# Patient Record
Sex: Male | Born: 1950
Health system: Southern US, Community
[De-identification: ages and names within clinical notes are randomized; demographics above are authoritative.]

## PROBLEM LIST (undated history)

## (undated) DIAGNOSIS — C801 Malignant (primary) neoplasm, unspecified: Secondary | ICD-10-CM

## (undated) DIAGNOSIS — H4310 Vitreous hemorrhage, unspecified eye: Secondary | ICD-10-CM

## (undated) DIAGNOSIS — M1990 Unspecified osteoarthritis, unspecified site: Secondary | ICD-10-CM

## (undated) DIAGNOSIS — I10 Essential (primary) hypertension: Secondary | ICD-10-CM

## (undated) DIAGNOSIS — E11319 Type 2 diabetes mellitus with unspecified diabetic retinopathy without macular edema: Secondary | ICD-10-CM

## (undated) DIAGNOSIS — E785 Hyperlipidemia, unspecified: Secondary | ICD-10-CM

## (undated) DIAGNOSIS — M5430 Sciatica, unspecified side: Secondary | ICD-10-CM

## (undated) DIAGNOSIS — N189 Chronic kidney disease, unspecified: Secondary | ICD-10-CM

## (undated) DIAGNOSIS — E119 Type 2 diabetes mellitus without complications: Secondary | ICD-10-CM

## (undated) DIAGNOSIS — I2510 Atherosclerotic heart disease of native coronary artery without angina pectoris: Secondary | ICD-10-CM

## (undated) HISTORY — DX: Hyperlipidemia, unspecified: E78.5

## (undated) HISTORY — PX: DIAGNOSTIC LAPAROSCOPY: SUR761

## (undated) HISTORY — PX: EYE SURGERY: SHX253

## (undated) HISTORY — DX: Essential (primary) hypertension: I10

## (undated) HISTORY — PX: KIDNEY SURGERY: SHX687

## (undated) HISTORY — PX: CATARACT EXTRACTION, BILATERAL: SHX1313

## (undated) HISTORY — DX: Unspecified osteoarthritis, unspecified site: M19.90

## (undated) HISTORY — PX: HERNIA REPAIR: SHX51

## (undated) HISTORY — DX: Atherosclerotic heart disease of native coronary artery without angina pectoris: I25.10

## (undated) HISTORY — DX: Type 2 diabetes mellitus without complications: E11.9

## (undated) HISTORY — PX: VASECTOMY: SHX75

---

## 2007-01-02 HISTORY — PX: CORONARY ARTERY BYPASS GRAFT: SHX141

## 2013-04-17 ENCOUNTER — Encounter: Payer: Self-pay | Admitting: Interventional Cardiology

## 2013-04-17 ENCOUNTER — Ambulatory Visit (INDEPENDENT_AMBULATORY_CARE_PROVIDER_SITE_OTHER): Payer: Commercial Indemnity | Admitting: Interventional Cardiology

## 2013-04-17 VITALS — BP 138/74 | HR 92 | Ht 68.0 in | Wt 166.4 lb

## 2013-04-17 DIAGNOSIS — I739 Peripheral vascular disease, unspecified: Secondary | ICD-10-CM

## 2013-04-17 DIAGNOSIS — E782 Mixed hyperlipidemia: Secondary | ICD-10-CM | POA: Insufficient documentation

## 2013-04-17 DIAGNOSIS — R9431 Abnormal electrocardiogram [ECG] [EKG]: Secondary | ICD-10-CM | POA: Insufficient documentation

## 2013-04-17 DIAGNOSIS — I779 Disorder of arteries and arterioles, unspecified: Secondary | ICD-10-CM | POA: Insufficient documentation

## 2013-04-17 DIAGNOSIS — IMO0002 Reserved for concepts with insufficient information to code with codable children: Secondary | ICD-10-CM | POA: Insufficient documentation

## 2013-04-17 DIAGNOSIS — I6529 Occlusion and stenosis of unspecified carotid artery: Secondary | ICD-10-CM

## 2013-04-17 DIAGNOSIS — I2510 Atherosclerotic heart disease of native coronary artery without angina pectoris: Secondary | ICD-10-CM | POA: Insufficient documentation

## 2013-04-17 NOTE — Patient Instructions (Addendum)
Your physician wants you to follow-up in: 6 months with Dr, Irish Lack. You will receive a reminder letter in the mail two months in advance. If you don't receive a letter, please call our office to schedule the follow-up appointment.  Your physician recommends that you continue on your current medications as directed. Please refer to the Current Medication list given to you today.  Have a copy of your cholesterol lab work faxed to [phone].  You will get a call with your carotid doppler appt!

## 2013-04-17 NOTE — Progress Notes (Signed)
Patient ID: Jeremy Johnston, male   DOB: 02/01/1951, 63 y.o.   MRN: [id number]     Patient ID: Jeremy Johnston MRN: [id number] DOB/AGE: 05-Oct-1950 63 y.o.   Referring Physician    Reason for Consultation: Establish care  HPI: 62 y/o who has had DM and CAD.  DM diagnosed in 1986.  He moved to [place] recently.  He had CABG in 2008.  He was essentially asypmtomatic at the time of his CABG.  He had a stress test in 2011 with a small inferior defect.  He had a ETT in 2014 which was unremarkable per his report.  He is alternating nuclear tests and ETT on an annual basis.  He is due for a nuclear test in 2015.    He has an occasional sharp pain on the left side of his chest.  He will be seeing Dr. Buddy Duty in Feb 2015.  He uses the treadmill regularly except for the last few months due to moving.  He walks up stairs and up hill to get to work.  No problems with that walking.    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  He works for [employer].    Current Outpatient Prescriptions  Medication Sig Dispense Refill  . aspirin 81 MG tablet Take 81 mg by mouth daily.      . clopidogrel (PLAVIX) 75 MG tablet Take 75 mg by mouth daily with breakfast.      . ezetimibe (ZETIA) 10 MG tablet Take 10 mg by mouth daily.      . Insulin Human (INSULIN PUMP) 100 unit/ml SOLN Inject into the skin.      Marland Kitchen quinapril-hydrochlorothiazide (ACCURETIC) 20-12.5 MG per tablet Take 1 tablet by mouth daily. 1/2 tablet daily      . rosuvastatin (CRESTOR) 40 MG tablet Take 40 mg by mouth daily.       No current facility-administered medications for this visit.   Past Medical History  Diagnosis Date  . Coronary artery disease   . Hyperlipidemia   . Hypertension   . Diabetes mellitus     Family History  Problem Relation Age of Onset  . Heart attack Father     History   Social History  . Marital Status: Married    Spouse Name: N/A    Number of Children: N/A  . Years of Education: N/A   Occupational History  . Not on  file.   Social History Main Topics  . Smoking status: Never Smoker   . Smokeless tobacco: Not on file  . Alcohol Use: Not on file  . Drug Use: Not on file  . Sexual Activity: Not on file   Other Topics Concern  . Not on file   Social History Narrative  . No narrative on file    Past Surgical History  Procedure Laterality Date  . Kidney surgery      tumor removed  . Eye surgery      retina peele  . Coronary artery bypass graft  10/08      (Not in a hospital admission)  Review of systems complete and found to be negative unless listed above .  No nausea, vomiting.  No fever chills, No focal weakness,  No palpitations.  Physical Exam: Filed Vitals:   04/17/13 0939  BP: 138/74  Pulse: 92    Weight: 166 lb 6.4 oz (75.479 kg)  Physical exam:  [place]/AT EOMI No JVD, No carotid bruit RRR S1S2  No wheezing Soft. NT, nondistended  No edema. No focal motor or sensory deficits Normal affect  Labs:   No results found for this basename: WBC, HGB, HCT, MCV, PLT   No results found for this basename: NA, K, CL, CO2, BUN, CREATININE, CALCIUM, LABALBU, PROT, BILITOT, ALKPHOS, ALT, AST, GLUCOSE,  in the last 168 hours No results found for this basename: CKTOTAL, CKMB, CKMBINDEX, TROPONINI    No results found for this basename: CHOL   No results found for this basename: HDL   No results found for this basename: LDLCALC   No results found for this basename: TRIG   No results found for this basename: CHOLHDL   No results found for this basename: LDLDIRECT      Radiology: Carotid duplex as below; ECho in 3/14 showed normal LV function. No RWMA.  Adequate valvuar function. EKG:NSR, inferior Q waves, inferior ST segment changes  ASSESSMENT AND PLAN:   CAD: Records reviewed.  Stress test noted as above.  Plan for stress test in 6 months.   Carotid atherosclerosis: Moderate bilateral carotid aterosclerosis.  Records reviewed. Left ICA 50-69%; right ICA < 50%.  Carotid Duplex  every 6 months.   Hyperlipidemia: Continue Crestor and Zetia.  LDL target < 100. Need carotid Duplex in 3/15.  Abnormal ECG: Inferior Q waves.  Noted on prior ECG.  Normal wall motion.  Signed:   Mina Marble, MD, [facility] 04/17/2013, 9:50 AM

## 2013-05-23 ENCOUNTER — Other Ambulatory Visit: Payer: Self-pay

## 2013-05-23 MED ORDER — QUINAPRIL-HYDROCHLOROTHIAZIDE 20-12.5 MG PO TABS: ORAL_TABLET | ORAL | Status: DC

## 2013-05-23 MED ORDER — CLOPIDOGREL BISULFATE 75 MG PO TABS: 75.0000 mg | ORAL_TABLET | Freq: Every day | ORAL | Status: DC

## 2013-06-16 ENCOUNTER — Ambulatory Visit (HOSPITAL_COMMUNITY): Payer: Commercial Indemnity

## 2013-06-16 ENCOUNTER — Encounter: Payer: Self-pay | Admitting: Internal Medicine

## 2013-07-03 ENCOUNTER — Encounter: Payer: Self-pay | Admitting: *Deleted

## 2013-07-03 ENCOUNTER — Encounter: Payer: Commercial Indemnity | Attending: Internal Medicine | Admitting: *Deleted

## 2013-07-03 VITALS — Ht 67.0 in | Wt 162.1 lb

## 2013-07-03 DIAGNOSIS — E119 Type 2 diabetes mellitus without complications: Secondary | ICD-10-CM | POA: Insufficient documentation

## 2013-07-03 DIAGNOSIS — Z713 Dietary counseling and surveillance: Secondary | ICD-10-CM | POA: Insufficient documentation

## 2013-07-03 DIAGNOSIS — Z794 Long term (current) use of insulin: Secondary | ICD-10-CM | POA: Insufficient documentation

## 2013-07-03 DIAGNOSIS — E1065 Type 1 diabetes mellitus with hyperglycemia: Secondary | ICD-10-CM

## 2013-07-03 DIAGNOSIS — IMO0002 Reserved for concepts with insufficient information to code with codable children: Secondary | ICD-10-CM

## 2013-07-03 NOTE — Patient Instructions (Signed)
You have completed the AOB and Health Questionnaires for the [facility] Vibe Pump and CGM Animas will contact you with updates on the progress of purchasing this

## 2013-07-03 NOTE — Progress Notes (Signed)
Introduction to Insulin Pump Therapy:  Appt start time: 1500 end time:  1600.  Assessment:  This patient has DM 1 and their primary concerns today: upgrading to newer Animas pump: Vibe.  This patient is interested in learning more about this insulin pump therapy because his is out of warranty  MEDICATIONS: See list. Patient uses Novolog in his Kerin Perna pump  Patient does currently have Ketone Strips  This patient is currently adjusting bolus insulin based BG at a correction ratio of 1/30 This patient is currently adjusting bolus insulin based on carb intake at ratio of 1/10 grams  Patient states knowledge of Carb Counting is excellent  Usual physical activity: treadmill 30 minutes 3 days a week at home  Last A1c was 7.9% on this date 05/15/13  Patient currently is working as Tax adviser for [employer] Obtaining an Insulin PumpGoal(s):  In progress.  Patient states their expectations of pump therapy include: continued management of his diabetes Patient expresses understanding that for improved outcomes for their diabetes on an insulin pump they will:  Check BG 4+ times per day  Change out pump infusion set at least every 3 days  Upload pump information to software on a regular basis so provider can assess patterns and make setting adjustments.  The Animas Vibe Pump is integrated with the Dexcom CGM     Intervention:   We reviewed the [employer]. I described the value of the CGM data and how the alerts can be helpful to prevent hyper and hypo-glycemia.   Handouts given during visit include:  Insulin Pump Packet from [employer]  Monitoring/Evaluation:    Patient does want to continue with pursuit of insulin pump and CGM  Follow up will be for training on the Vibe and Dexcom G4 CGM

## 2013-08-01 ENCOUNTER — Ambulatory Visit (HOSPITAL_COMMUNITY): Payer: Commercial Indemnity | Attending: Internal Medicine | Admitting: Cardiology

## 2013-08-01 DIAGNOSIS — I2510 Atherosclerotic heart disease of native coronary artery without angina pectoris: Secondary | ICD-10-CM | POA: Insufficient documentation

## 2013-08-01 DIAGNOSIS — E119 Type 2 diabetes mellitus without complications: Secondary | ICD-10-CM | POA: Insufficient documentation

## 2013-08-01 DIAGNOSIS — I658 Occlusion and stenosis of other precerebral arteries: Secondary | ICD-10-CM | POA: Insufficient documentation

## 2013-08-01 DIAGNOSIS — Z951 Presence of aortocoronary bypass graft: Secondary | ICD-10-CM | POA: Insufficient documentation

## 2013-08-01 DIAGNOSIS — E785 Hyperlipidemia, unspecified: Secondary | ICD-10-CM | POA: Insufficient documentation

## 2013-08-01 DIAGNOSIS — I6529 Occlusion and stenosis of unspecified carotid artery: Secondary | ICD-10-CM | POA: Insufficient documentation

## 2013-08-01 DIAGNOSIS — I10 Essential (primary) hypertension: Secondary | ICD-10-CM | POA: Insufficient documentation

## 2013-08-01 NOTE — Progress Notes (Signed)
Carotid duplex completed 

## 2013-08-05 ENCOUNTER — Telehealth: Payer: Self-pay | Admitting: Interventional Cardiology

## 2013-08-05 NOTE — Telephone Encounter (Signed)
Returned pts call with results.  

## 2013-08-05 NOTE — Telephone Encounter (Signed)
Follow Up ° °Pt returning call from earlier. Please call. °

## 2013-08-13 ENCOUNTER — Encounter (INDEPENDENT_AMBULATORY_CARE_PROVIDER_SITE_OTHER): Payer: Self-pay | Admitting: Ophthalmology

## 2013-08-19 ENCOUNTER — Telehealth: Payer: Self-pay | Admitting: Interventional Cardiology

## 2013-08-19 ENCOUNTER — Other Ambulatory Visit: Payer: Self-pay | Admitting: Cardiology

## 2013-08-19 DIAGNOSIS — I2510 Atherosclerotic heart disease of native coronary artery without angina pectoris: Secondary | ICD-10-CM

## 2013-08-19 NOTE — Telephone Encounter (Signed)
Follow up       Pt will call back around 1 or if you can call him back around 1 he can answer the phone.

## 2013-08-19 NOTE — Telephone Encounter (Signed)
Follow up  ° ° ° °Returning call back to nurse  °

## 2013-08-19 NOTE — Telephone Encounter (Signed)
Pt aware of stress myoview instructions.

## 2013-08-19 NOTE — Telephone Encounter (Signed)
lmtrc

## 2013-08-27 ENCOUNTER — Other Ambulatory Visit (INDEPENDENT_AMBULATORY_CARE_PROVIDER_SITE_OTHER): Payer: Managed Care, Other (non HMO) | Admitting: Ophthalmology

## 2013-08-27 ENCOUNTER — Encounter (INDEPENDENT_AMBULATORY_CARE_PROVIDER_SITE_OTHER): Payer: Managed Care, Other (non HMO) | Admitting: Ophthalmology

## 2013-08-27 DIAGNOSIS — E1165 Type 2 diabetes mellitus with hyperglycemia: Secondary | ICD-10-CM

## 2013-08-27 DIAGNOSIS — I10 Essential (primary) hypertension: Secondary | ICD-10-CM

## 2013-08-27 DIAGNOSIS — E1139 Type 2 diabetes mellitus with other diabetic ophthalmic complication: Secondary | ICD-10-CM

## 2013-08-27 DIAGNOSIS — H43819 Vitreous degeneration, unspecified eye: Secondary | ICD-10-CM

## 2013-08-27 DIAGNOSIS — E11359 Type 2 diabetes mellitus with proliferative diabetic retinopathy without macular edema: Secondary | ICD-10-CM

## 2013-08-27 DIAGNOSIS — H35039 Hypertensive retinopathy, unspecified eye: Secondary | ICD-10-CM

## 2013-08-27 DIAGNOSIS — H2510 Age-related nuclear cataract, unspecified eye: Secondary | ICD-10-CM

## 2013-08-27 DIAGNOSIS — H4310 Vitreous hemorrhage, unspecified eye: Secondary | ICD-10-CM

## 2013-09-01 ENCOUNTER — Ambulatory Visit (HOSPITAL_COMMUNITY): Payer: Commercial Indemnity | Attending: Cardiovascular Disease | Admitting: Radiology

## 2013-09-01 VITALS — BP 135/68 | HR 68 | Ht 67.0 in | Wt 156.0 lb

## 2013-09-01 DIAGNOSIS — I10 Essential (primary) hypertension: Secondary | ICD-10-CM | POA: Insufficient documentation

## 2013-09-01 DIAGNOSIS — E119 Type 2 diabetes mellitus without complications: Secondary | ICD-10-CM | POA: Insufficient documentation

## 2013-09-01 DIAGNOSIS — R079 Chest pain, unspecified: Secondary | ICD-10-CM | POA: Insufficient documentation

## 2013-09-01 DIAGNOSIS — Z9861 Coronary angioplasty status: Secondary | ICD-10-CM | POA: Insufficient documentation

## 2013-09-01 DIAGNOSIS — Z951 Presence of aortocoronary bypass graft: Secondary | ICD-10-CM | POA: Insufficient documentation

## 2013-09-01 DIAGNOSIS — I2510 Atherosclerotic heart disease of native coronary artery without angina pectoris: Secondary | ICD-10-CM | POA: Insufficient documentation

## 2013-09-01 MED ORDER — TECHNETIUM TC 99M SESTAMIBI GENERIC - CARDIOLITE
33.0000 | Freq: Once | INTRAVENOUS | Status: AC | PRN
  Administered 2013-09-01: 33 via INTRAVENOUS

## 2013-09-01 MED ORDER — TECHNETIUM TC 99M SESTAMIBI GENERIC - CARDIOLITE
11.0000 | Freq: Once | INTRAVENOUS | Status: AC | PRN
  Administered 2013-09-01: 11 via INTRAVENOUS

## 2013-09-01 NOTE — Progress Notes (Signed)
[facility] [address] [place],  [address] [phone]    Cardiology Nuclear Med Study  [address] Jeremy Johnston is a 63 y.o. male     MRN : [id number]     DOB: [birth year]  Procedure Date: 09/01/2013  Nuclear Med Background Indication for Stress Test:  Evaluation for Ischemia, Graft Patency and FAA History:  CAD, Cath, CABG, Echo 2014 EF 59%, MPI 2011 (small inf. defect) Cardiac Risk Factors: Carotid Disease, Hypertension, IDDM,and Lipids  Symptoms:  Chest Pain   Nuclear Pre-Procedure Caffeine/Decaff Intake:  None> 12 hrs NPO After: 0615 today   Lungs:  clear O2 Sat: 95% on room air. IV 0.9% NS with Angio Cath:  22g  IV Site: L Antecubital x 1, tolerated well IV Started by:  Irven Baltimore, RN  Chest Size (in):  38 Cup Size: n/a  Height: 5\' 7"  (1.702 m)  Weight:  156 lb (70.761 kg)  BMI:  Body mass index is 24.43 kg/(m^2). Tech Comments:  Fasting CBG was 79 at 0600 today with juice taken per patient. No medications or Insulin Bolus this am. Repeat CBG was 170 at 0717 this am per patient. Irven Baltimore, RN.    Nuclear Med Study 1 or 2 day study: 1 day  Stress Test Type:  Stress  Reading MD: N/A  Order Authorizing Provider:  Larae Grooms, MD  Resting Radionuclide: Technetium 40m Sestamibi  Resting Radionuclide Dose: 11.0 mCi   Stress Radionuclide:  Technetium 16m Sestamibi  Stress Radionuclide Dose: 33.0 mCi           Stress Protocol Rest HR: 68 Stress HR: 153  Rest BP: 135/68 Stress BP: 151/66  Exercise Time (min): 9:30 METS: 10.9           Dose of Adenosine (mg):  n/a Dose of Lexiscan: n/a mg  Dose of Atropine (mg): n/a Dose of Dobutamine: n/a mcg/kg/min (at max HR)  Stress Test Technologist: Glade Lloyd, BS-ES  Nuclear Technologist:  Charlton Amor, CNMT     Rest Procedure:  Myocardial perfusion imaging was performed at rest 45 minutes following the intravenous administration of Technetium 3m Sestamibi. Rest ECG: NSR with  non-specific ST-T wave changes  Stress Procedure:  The patient exercised on the treadmill utilizing the Bruce Protocol for 9:30 minutes. The patient stopped due to fatigue and denied any chest pain.  Technetium 66m Sestamibi was injected at peak exercise and myocardial perfusion imaging was performed after a brief delay. Stress ECG: Downsloping ST depression V4-V6 with stress.  QPS Raw Data Images:  Normal; no motion artifact; normal heart/lung ratio. Stress Images:  There is decreased uptake in the inferior wall. Rest Images:  There is decreased uptake in the inferior wall. Subtraction (SDS):  There is a fixed defect that is most consistent with a previous infarction. Transient Ischemic Dilatation (Normal <1.22):  1.08 Lung/Heart Ratio (Normal <0.45):  0.34  Quantitative Gated Spect Images QGS EDV:  121 ml QGS ESV:  59 ml  Impression Exercise Capacity:  Good exercise capacity. BP Response:  Hypotensive blood pressure response. Clinical Symptoms:  No chest pain. ECG Impression:  1 mm downsloping ST depression V4-V6 Comparison with Prior Nuclear Study: No images to compare  Overall Impression:  Intermediate risk stress nuclear study. No symptoms were reported.  There is a small inferolateral defect of moderate severity and with partial reversibility, consistent with old scar with mild peri-infarct ischemia. There was a hypotensive response to exercise in stage 3 (94/68?) and early recovery stage.  Overall LV function is normal with mild inferior wall hypokinesis.  LV Ejection Fraction: 52%.  LV Wall Motion:  Mild inferior wall hypokinesis.   Darlin Coco MD

## 2013-12-10 ENCOUNTER — Ambulatory Visit (INDEPENDENT_AMBULATORY_CARE_PROVIDER_SITE_OTHER): Payer: Managed Care, Other (non HMO) | Admitting: Ophthalmology

## 2013-12-10 DIAGNOSIS — H43819 Vitreous degeneration, unspecified eye: Secondary | ICD-10-CM

## 2013-12-10 DIAGNOSIS — H2510 Age-related nuclear cataract, unspecified eye: Secondary | ICD-10-CM

## 2013-12-10 DIAGNOSIS — H35039 Hypertensive retinopathy, unspecified eye: Secondary | ICD-10-CM

## 2013-12-10 DIAGNOSIS — I10 Essential (primary) hypertension: Secondary | ICD-10-CM

## 2013-12-10 DIAGNOSIS — E11359 Type 2 diabetes mellitus with proliferative diabetic retinopathy without macular edema: Secondary | ICD-10-CM

## 2013-12-10 DIAGNOSIS — E1165 Type 2 diabetes mellitus with hyperglycemia: Secondary | ICD-10-CM

## 2013-12-10 DIAGNOSIS — E11311 Type 2 diabetes mellitus with unspecified diabetic retinopathy with macular edema: Secondary | ICD-10-CM

## 2013-12-10 DIAGNOSIS — E1139 Type 2 diabetes mellitus with other diabetic ophthalmic complication: Secondary | ICD-10-CM

## 2013-12-29 ENCOUNTER — Ambulatory Visit (INDEPENDENT_AMBULATORY_CARE_PROVIDER_SITE_OTHER): Payer: Managed Care, Other (non HMO) | Admitting: Ophthalmology

## 2014-01-02 ENCOUNTER — Encounter (INDEPENDENT_AMBULATORY_CARE_PROVIDER_SITE_OTHER): Payer: Managed Care, Other (non HMO) | Admitting: Ophthalmology

## 2014-01-02 DIAGNOSIS — I10 Essential (primary) hypertension: Secondary | ICD-10-CM

## 2014-01-02 DIAGNOSIS — E11311 Type 2 diabetes mellitus with unspecified diabetic retinopathy with macular edema: Secondary | ICD-10-CM

## 2014-01-02 DIAGNOSIS — H35033 Hypertensive retinopathy, bilateral: Secondary | ICD-10-CM

## 2014-01-02 DIAGNOSIS — E11351 Type 2 diabetes mellitus with proliferative diabetic retinopathy with macular edema: Secondary | ICD-10-CM

## 2014-01-02 DIAGNOSIS — H43813 Vitreous degeneration, bilateral: Secondary | ICD-10-CM

## 2014-01-30 ENCOUNTER — Encounter (INDEPENDENT_AMBULATORY_CARE_PROVIDER_SITE_OTHER): Payer: Managed Care, Other (non HMO) | Admitting: Ophthalmology

## 2014-01-30 DIAGNOSIS — H43813 Vitreous degeneration, bilateral: Secondary | ICD-10-CM

## 2014-01-30 DIAGNOSIS — H35033 Hypertensive retinopathy, bilateral: Secondary | ICD-10-CM

## 2014-01-30 DIAGNOSIS — E11311 Type 2 diabetes mellitus with unspecified diabetic retinopathy with macular edema: Secondary | ICD-10-CM

## 2014-01-30 DIAGNOSIS — I10 Essential (primary) hypertension: Secondary | ICD-10-CM

## 2014-01-30 DIAGNOSIS — E11351 Type 2 diabetes mellitus with proliferative diabetic retinopathy with macular edema: Secondary | ICD-10-CM

## 2014-02-25 ENCOUNTER — Encounter (INDEPENDENT_AMBULATORY_CARE_PROVIDER_SITE_OTHER): Payer: Managed Care, Other (non HMO) | Admitting: Ophthalmology

## 2014-02-25 DIAGNOSIS — E11311 Type 2 diabetes mellitus with unspecified diabetic retinopathy with macular edema: Secondary | ICD-10-CM

## 2014-02-25 DIAGNOSIS — H35033 Hypertensive retinopathy, bilateral: Secondary | ICD-10-CM

## 2014-02-25 DIAGNOSIS — E11351 Type 2 diabetes mellitus with proliferative diabetic retinopathy with macular edema: Secondary | ICD-10-CM

## 2014-02-25 DIAGNOSIS — H43813 Vitreous degeneration, bilateral: Secondary | ICD-10-CM

## 2014-02-25 DIAGNOSIS — I10 Essential (primary) hypertension: Secondary | ICD-10-CM

## 2014-03-02 ENCOUNTER — Encounter (INDEPENDENT_AMBULATORY_CARE_PROVIDER_SITE_OTHER): Payer: Managed Care, Other (non HMO) | Admitting: Ophthalmology

## 2014-03-02 DIAGNOSIS — H35033 Hypertensive retinopathy, bilateral: Secondary | ICD-10-CM

## 2014-03-02 DIAGNOSIS — H43813 Vitreous degeneration, bilateral: Secondary | ICD-10-CM

## 2014-03-02 DIAGNOSIS — E11311 Type 2 diabetes mellitus with unspecified diabetic retinopathy with macular edema: Secondary | ICD-10-CM

## 2014-03-02 DIAGNOSIS — E11351 Type 2 diabetes mellitus with proliferative diabetic retinopathy with macular edema: Secondary | ICD-10-CM

## 2014-03-02 DIAGNOSIS — H4312 Vitreous hemorrhage, left eye: Secondary | ICD-10-CM

## 2014-03-02 DIAGNOSIS — I10 Essential (primary) hypertension: Secondary | ICD-10-CM

## 2014-03-02 DIAGNOSIS — E11359 Type 2 diabetes mellitus with proliferative diabetic retinopathy without macular edema: Secondary | ICD-10-CM

## 2014-03-04 ENCOUNTER — Other Ambulatory Visit: Payer: Self-pay | Admitting: Gastroenterology

## 2014-03-23 ENCOUNTER — Encounter (HOSPITAL_COMMUNITY): Payer: Self-pay | Admitting: *Deleted

## 2014-04-01 ENCOUNTER — Encounter (INDEPENDENT_AMBULATORY_CARE_PROVIDER_SITE_OTHER): Payer: Managed Care, Other (non HMO) | Admitting: Ophthalmology

## 2014-04-01 DIAGNOSIS — H43813 Vitreous degeneration, bilateral: Secondary | ICD-10-CM

## 2014-04-01 DIAGNOSIS — E11311 Type 2 diabetes mellitus with unspecified diabetic retinopathy with macular edema: Secondary | ICD-10-CM

## 2014-04-01 DIAGNOSIS — H35033 Hypertensive retinopathy, bilateral: Secondary | ICD-10-CM

## 2014-04-01 DIAGNOSIS — E11351 Type 2 diabetes mellitus with proliferative diabetic retinopathy with macular edema: Secondary | ICD-10-CM

## 2014-04-01 DIAGNOSIS — I10 Essential (primary) hypertension: Secondary | ICD-10-CM

## 2014-04-13 ENCOUNTER — Ambulatory Visit (HOSPITAL_COMMUNITY): Admission: RE | Admit: 2014-04-13 | Payer: Commercial Indemnity | Source: Ambulatory Visit | Admitting: Gastroenterology

## 2014-04-13 HISTORY — DX: Chronic kidney disease, unspecified: N18.9

## 2014-04-13 HISTORY — DX: Malignant (primary) neoplasm, unspecified: C80.1

## 2014-04-13 SURGERY — COLONOSCOPY WITH PROPOFOL
Anesthesia: Monitor Anesthesia Care

## 2014-04-29 ENCOUNTER — Encounter (INDEPENDENT_AMBULATORY_CARE_PROVIDER_SITE_OTHER): Payer: Managed Care, Other (non HMO) | Admitting: Ophthalmology

## 2014-04-30 ENCOUNTER — Encounter (INDEPENDENT_AMBULATORY_CARE_PROVIDER_SITE_OTHER): Payer: Managed Care, Other (non HMO) | Admitting: Ophthalmology

## 2014-05-01 ENCOUNTER — Encounter (INDEPENDENT_AMBULATORY_CARE_PROVIDER_SITE_OTHER): Payer: Managed Care, Other (non HMO) | Admitting: Ophthalmology

## 2014-05-04 ENCOUNTER — Encounter (INDEPENDENT_AMBULATORY_CARE_PROVIDER_SITE_OTHER): Payer: Managed Care, Other (non HMO) | Admitting: Ophthalmology

## 2014-05-04 DIAGNOSIS — E11351 Type 2 diabetes mellitus with proliferative diabetic retinopathy with macular edema: Secondary | ICD-10-CM

## 2014-05-04 DIAGNOSIS — H43813 Vitreous degeneration, bilateral: Secondary | ICD-10-CM

## 2014-05-04 DIAGNOSIS — H35033 Hypertensive retinopathy, bilateral: Secondary | ICD-10-CM

## 2014-05-04 DIAGNOSIS — E11311 Type 2 diabetes mellitus with unspecified diabetic retinopathy with macular edema: Secondary | ICD-10-CM

## 2014-05-04 DIAGNOSIS — I10 Essential (primary) hypertension: Secondary | ICD-10-CM

## 2014-05-28 ENCOUNTER — Encounter (INDEPENDENT_AMBULATORY_CARE_PROVIDER_SITE_OTHER): Payer: Managed Care, Other (non HMO) | Admitting: Ophthalmology

## 2014-05-28 DIAGNOSIS — H35033 Hypertensive retinopathy, bilateral: Secondary | ICD-10-CM

## 2014-05-28 DIAGNOSIS — E11351 Type 2 diabetes mellitus with proliferative diabetic retinopathy with macular edema: Secondary | ICD-10-CM

## 2014-05-28 DIAGNOSIS — H43813 Vitreous degeneration, bilateral: Secondary | ICD-10-CM

## 2014-05-28 DIAGNOSIS — E11311 Type 2 diabetes mellitus with unspecified diabetic retinopathy with macular edema: Secondary | ICD-10-CM

## 2014-05-28 DIAGNOSIS — I10 Essential (primary) hypertension: Secondary | ICD-10-CM

## 2014-05-29 ENCOUNTER — Other Ambulatory Visit: Payer: Self-pay

## 2014-05-29 MED ORDER — QUINAPRIL-HYDROCHLOROTHIAZIDE 20-12.5 MG PO TABS
ORAL_TABLET | ORAL | Status: DC
Start: 2014-05-29 — End: 2014-10-15

## 2014-05-29 MED ORDER — CLOPIDOGREL BISULFATE 75 MG PO TABS: 75.0000 mg | ORAL_TABLET | Freq: Every day | ORAL | Status: DC

## 2014-06-25 ENCOUNTER — Encounter (INDEPENDENT_AMBULATORY_CARE_PROVIDER_SITE_OTHER): Payer: Managed Care, Other (non HMO) | Admitting: Ophthalmology

## 2014-06-25 DIAGNOSIS — I10 Essential (primary) hypertension: Secondary | ICD-10-CM | POA: Diagnosis not present

## 2014-06-25 DIAGNOSIS — E11351 Type 2 diabetes mellitus with proliferative diabetic retinopathy with macular edema: Secondary | ICD-10-CM | POA: Diagnosis not present

## 2014-06-25 DIAGNOSIS — H35033 Hypertensive retinopathy, bilateral: Secondary | ICD-10-CM

## 2014-06-25 DIAGNOSIS — H43813 Vitreous degeneration, bilateral: Secondary | ICD-10-CM | POA: Diagnosis not present

## 2014-06-25 DIAGNOSIS — E11311 Type 2 diabetes mellitus with unspecified diabetic retinopathy with macular edema: Secondary | ICD-10-CM | POA: Diagnosis not present

## 2014-07-31 ENCOUNTER — Encounter (INDEPENDENT_AMBULATORY_CARE_PROVIDER_SITE_OTHER): Payer: Managed Care, Other (non HMO) | Admitting: Ophthalmology

## 2014-08-03 ENCOUNTER — Encounter (INDEPENDENT_AMBULATORY_CARE_PROVIDER_SITE_OTHER): Payer: Managed Care, Other (non HMO) | Admitting: Ophthalmology

## 2014-08-03 DIAGNOSIS — H43813 Vitreous degeneration, bilateral: Secondary | ICD-10-CM

## 2014-08-03 DIAGNOSIS — I10 Essential (primary) hypertension: Secondary | ICD-10-CM

## 2014-08-03 DIAGNOSIS — E11351 Type 2 diabetes mellitus with proliferative diabetic retinopathy with macular edema: Secondary | ICD-10-CM

## 2014-08-03 DIAGNOSIS — E11311 Type 2 diabetes mellitus with unspecified diabetic retinopathy with macular edema: Secondary | ICD-10-CM | POA: Diagnosis not present

## 2014-08-03 DIAGNOSIS — H35033 Hypertensive retinopathy, bilateral: Secondary | ICD-10-CM

## 2014-09-11 ENCOUNTER — Encounter (INDEPENDENT_AMBULATORY_CARE_PROVIDER_SITE_OTHER): Payer: Managed Care, Other (non HMO) | Admitting: Ophthalmology

## 2014-09-11 DIAGNOSIS — E11311 Type 2 diabetes mellitus with unspecified diabetic retinopathy with macular edema: Secondary | ICD-10-CM | POA: Diagnosis not present

## 2014-09-11 DIAGNOSIS — H43813 Vitreous degeneration, bilateral: Secondary | ICD-10-CM

## 2014-09-11 DIAGNOSIS — I10 Essential (primary) hypertension: Secondary | ICD-10-CM

## 2014-09-11 DIAGNOSIS — E11351 Type 2 diabetes mellitus with proliferative diabetic retinopathy with macular edema: Secondary | ICD-10-CM

## 2014-09-11 DIAGNOSIS — E11359 Type 2 diabetes mellitus with proliferative diabetic retinopathy without macular edema: Secondary | ICD-10-CM

## 2014-09-11 DIAGNOSIS — H35033 Hypertensive retinopathy, bilateral: Secondary | ICD-10-CM | POA: Diagnosis not present

## 2014-09-14 ENCOUNTER — Encounter (INDEPENDENT_AMBULATORY_CARE_PROVIDER_SITE_OTHER): Payer: Managed Care, Other (non HMO) | Admitting: Ophthalmology

## 2014-09-17 ENCOUNTER — Telehealth: Payer: Self-pay | Admitting: Interventional Cardiology

## 2014-09-17 DIAGNOSIS — I2510 Atherosclerotic heart disease of native coronary artery without angina pectoris: Secondary | ICD-10-CM

## 2014-09-17 NOTE — Telephone Encounter (Signed)
Pt calling stating that he normally has a stress test each year. Pt states that he typically alternates between a nuclear every other year and then a ETT in between. Informed pt that I would route this information to Dr. Irish Lack for review and advisement on ordering ETT. Scheduled pt for OV as well on 7/14.

## 2014-09-17 NOTE — Telephone Encounter (Signed)
New Message       Pt calling stating that he would like to have an exercise tolerance test done, there are no orders in Epic. Please call back and advise.

## 2014-09-17 NOTE — Telephone Encounter (Signed)
Informed pt that Dr. Irish Lack said it was ok to order ETT. Gave verbal instructions. Pt verbalized understanding.

## 2014-09-17 NOTE — Telephone Encounter (Signed)
Left message to call back  

## 2014-09-17 NOTE — Telephone Encounter (Signed)
OK to order ETT for h/o CAD.

## 2014-09-22 ENCOUNTER — Telehealth: Payer: Self-pay | Admitting: Interventional Cardiology

## 2014-09-22 NOTE — Telephone Encounter (Signed)
Spoke with the [facility] team and they said they had called yesterday to schedule ETT. Spoke with pt and informed him that it was our scheduling team that had called him. Informed pt that I let them know that he called back and they would be contacting him to schedule. Pt verbalized understanding and was in agreement with this plan.

## 2014-09-22 NOTE — Telephone Encounter (Signed)
New message      Returning a call to Dr Hassell Done nurse

## 2014-09-24 ENCOUNTER — Telehealth (HOSPITAL_COMMUNITY): Payer: Self-pay

## 2014-09-24 NOTE — Telephone Encounter (Signed)
Encounter complete. 

## 2014-09-29 ENCOUNTER — Ambulatory Visit (HOSPITAL_COMMUNITY)
Admission: RE | Admit: 2014-09-29 | Discharge: 2014-09-29 | Disposition: A | Discharge status: Home / Self Care | Payer: Commercial Indemnity | Source: Ambulatory Visit | Attending: Interventional Cardiology | Admitting: Interventional Cardiology

## 2014-09-29 DIAGNOSIS — R0609 Other forms of dyspnea: Secondary | ICD-10-CM | POA: Insufficient documentation

## 2014-09-29 DIAGNOSIS — I493 Ventricular premature depolarization: Secondary | ICD-10-CM | POA: Diagnosis not present

## 2014-09-29 DIAGNOSIS — I2510 Atherosclerotic heart disease of native coronary artery without angina pectoris: Secondary | ICD-10-CM | POA: Diagnosis not present

## 2014-09-29 LAB — EXERCISE TOLERANCE TEST
CHL CUP MPHR: 157 {beats}/min
CHL CUP RESTING HR STRESS: 91 {beats}/min
CSEPED: 9 min
CSEPHR: 104 %
Estimated workload: 10.3 METS
Peak HR: 164 {beats}/min
RPE: 15

## 2014-10-15 ENCOUNTER — Ambulatory Visit (INDEPENDENT_AMBULATORY_CARE_PROVIDER_SITE_OTHER): Payer: Commercial Indemnity | Admitting: Interventional Cardiology

## 2014-10-15 ENCOUNTER — Encounter: Payer: Self-pay | Admitting: Interventional Cardiology

## 2014-10-15 VITALS — BP 110/60 | HR 69 | Ht 67.0 in | Wt 169.4 lb

## 2014-10-15 DIAGNOSIS — E782 Mixed hyperlipidemia: Secondary | ICD-10-CM

## 2014-10-15 DIAGNOSIS — I10 Essential (primary) hypertension: Secondary | ICD-10-CM

## 2014-10-15 DIAGNOSIS — I2510 Atherosclerotic heart disease of native coronary artery without angina pectoris: Secondary | ICD-10-CM

## 2014-10-15 DIAGNOSIS — I739 Peripheral vascular disease, unspecified: Principal | ICD-10-CM

## 2014-10-15 DIAGNOSIS — I779 Disorder of arteries and arterioles, unspecified: Secondary | ICD-10-CM | POA: Diagnosis not present

## 2014-10-15 MED ORDER — QUINAPRIL-HYDROCHLOROTHIAZIDE 20-12.5 MG PO TABS: ORAL_TABLET | ORAL | Status: DC

## 2014-10-15 MED ORDER — CLOPIDOGREL BISULFATE 75 MG PO TABS: 75.0000 mg | ORAL_TABLET | Freq: Every day | ORAL | Status: DC

## 2014-10-15 NOTE — Progress Notes (Signed)
Patient ID: Jeremy Johnston, male   DOB: [birth year], 64 y.o.   MRN: [id number]     Cardiology Office Note   Date:  10/15/2014   ID:  Jeremy Johnston, DOB 01-Apr-1951, MRN [id number]  PCP:  No primary care provider on file.  [name] primary; [name].    Chief Complaint  Patient presents with  . Follow-up    mixed hyperlipidemia     Wt Readings from Last 3 Encounters:  10/15/14 169 lb 6.4 oz (76.839 kg)  09/01/13 156 lb (70.761 kg)  07/03/13 162 lb 1.6 oz (73.528 kg)       History of Present Illness: Jeremy Johnston is a 64 y.o. male  who has had DM and CAD. DM diagnosed in 1986. He moved to [place] recently. He had CABG in 2008. He was essentially asypmtomatic at the time of his CABG. He had a stress test in 2011 with a small inferior defect. He had a ETT in 2014 which was unremarkable per his report. He is alternating nuclear tests and ETT on an annual basis.     He uses the treadmill intermittently. He walks up stairs and up hill to get to work. No problems with that walking.   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 He had an ETT (9 min on treadmill) in 6/16 which was low risk.  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     Past Medical History  Diagnosis Date  . Coronary artery disease   . Hyperlipidemia   . Hypertension   . Diabetes mellitus     Insulin pump -[name], and has a glucose transmitter attached.  . Chronic kidney disease     Robotic Kidney surgery for kidney cancer tumor-kidney remains-  . Cancer     kidney cancer-both kidneys remain    Past Surgical History  Procedure Laterality Date  . Kidney surgery      tumor removed  . Eye surgery      retina peele  . Coronary artery bypass graft  10/08    x4 vessels([place])  . Diagnostic laparoscopy    . Vasectomy    . Cataract extraction, bilateral Bilateral     11'15     Current Outpatient Prescriptions  Medication  Sig Dispense Refill  . aspirin 81 MG tablet Take 81 mg by mouth daily.    . clopidogrel (PLAVIX) 75 MG tablet Take 1 tablet (75 mg total) by mouth daily with breakfast. 90 tablet 3  . Insulin Human (INSULIN PUMP) 100 unit/ml SOLN Inject into the skin.    . Multiple Vitamin (MULTIVITAMIN WITH MINERALS) TABS tablet Take 1 tablet by mouth daily.    . quinapril-hydrochlorothiazide (ACCURETIC) 20-12.5 MG per tablet 1/2 tablet daily 90 tablet 3  . rosuvastatin (CRESTOR) 40 MG tablet Take 40 mg by mouth every evening.      No current facility-administered medications for this visit.    Allergies:   Review of patient's allergies indicates no known allergies.    Social History:  The patient  reports that he has never smoked. He does not have any smokeless tobacco history on file. He reports that he does not drink alcohol or use illicit drugs.   Family History:  The patient's family history includes Heart attack in his father.    ROS:  Please see the history of present illness.   Otherwise, review of systems are positive for  right sided heart burn- resolved after stopping coffee.   All other systems are reviewed and negative.    PHYSICAL EXAM: VS:  BP 110/60 mmHg  Pulse 69  Ht 5\' 7"  (1.702 m)  Wt 169 lb 6.4 oz (76.839 kg)  BMI 26.53 kg/m2 , BMI Body mass index is 26.53 kg/(m^2). GEN: Well nourished, well developed, in no acute distress HEENT: normal Neck: no JVD, carotid bruits, or masses Cardiac: RRR; no murmurs, rubs, or gallops,no edema  Respiratory:  clear to auscultation bilaterally, normal work of breathing GI: soft, nontender, nondistended, + BS MS: no deformity or atrophy Skin: warm and dry, no rash Neuro:  Strength and sensation are intact Psych: euthymic mood, full affect   EKG:   The ekg ordered today demonstrates NSR, NSST   Recent Labs: No results found for requested labs within last 365 days.   Lipid Panel No results found for: CHOL, TRIG, HDL, CHOLHDL, VLDL,  LDLCALC, LDLDIRECT   Other studies Reviewed: Additional studies/ records that were reviewed today with results demonstrating: stress test result as noted.   ASSESSMENT AND PLAN:  1. CAD:  No angina. Recent stress test was low risk. Continue aggressive secondary prevention. No bleeding problems. Refill clopidogrel. 2. Carotid atherosclerosis:  Repeat carotid Doppler.  Moderate disease. Last check in May 2015. 3. Hyperlipidemia:  Continue current lipid-lowering therapy. He has labs checked with Dr. Buddy Duty. Will have there is labs  Sent to [place] after the next check. 4. HTN:  Well controlled. Continue current medicines. Refill quinapril.   Current medicines are reviewed at length with the patient today.  The patient concerns regarding his medicines were addressed.  The following changes have been made:  No change  Labs/ tests ordered today include:   Orders Placed This Encounter  Procedures  . EKG 12-Lead    Recommend 150 minutes/week of aerobic exercise:  Stressed importance of increasing frequency and consistency of exercise. Low fat, low carb, high fiber diet recommended  Disposition:   FU in 1 year   Teresita Madura., MD  10/15/2014 10:16 AM    [facility] [place], [place], [place]  [address] Phone: [phone]; Fax: [phone]

## 2014-10-15 NOTE — Patient Instructions (Addendum)
Medication Instructions:  Same-co change  Labwork: None  Testing/Procedures: Your physician has requested that you have a carotid duplex. This test is an ultrasound of the carotid arteries in your neck. It looks at blood flow through these arteries that supply the brain with blood. Allow one hour for this exam. There are no restrictions or special instructions.   Follow-Up: Your physician wants you to follow-up in: 1 year. You will receive a reminder letter in the mail two months in advance. If you don't receive a letter, please call our office to schedule the follow-up appointment.

## 2014-10-19 ENCOUNTER — Ambulatory Visit (HOSPITAL_COMMUNITY): Payer: Commercial Indemnity | Attending: Cardiology

## 2014-10-19 DIAGNOSIS — I6523 Occlusion and stenosis of bilateral carotid arteries: Secondary | ICD-10-CM

## 2014-10-19 DIAGNOSIS — I739 Peripheral vascular disease, unspecified: Secondary | ICD-10-CM

## 2014-10-19 DIAGNOSIS — I779 Disorder of arteries and arterioles, unspecified: Secondary | ICD-10-CM | POA: Diagnosis present

## 2014-10-23 ENCOUNTER — Telehealth: Payer: Self-pay | Admitting: Interventional Cardiology

## 2014-10-23 ENCOUNTER — Encounter (INDEPENDENT_AMBULATORY_CARE_PROVIDER_SITE_OTHER): Payer: Managed Care, Other (non HMO) | Admitting: Ophthalmology

## 2014-10-23 DIAGNOSIS — H35372 Puckering of macula, left eye: Secondary | ICD-10-CM | POA: Diagnosis not present

## 2014-10-23 DIAGNOSIS — H35033 Hypertensive retinopathy, bilateral: Secondary | ICD-10-CM | POA: Diagnosis not present

## 2014-10-23 DIAGNOSIS — I10 Essential (primary) hypertension: Secondary | ICD-10-CM | POA: Diagnosis not present

## 2014-10-23 DIAGNOSIS — H43813 Vitreous degeneration, bilateral: Secondary | ICD-10-CM

## 2014-10-23 DIAGNOSIS — E11311 Type 2 diabetes mellitus with unspecified diabetic retinopathy with macular edema: Secondary | ICD-10-CM | POA: Diagnosis not present

## 2014-10-23 DIAGNOSIS — E11351 Type 2 diabetes mellitus with proliferative diabetic retinopathy with macular edema: Secondary | ICD-10-CM | POA: Diagnosis not present

## 2014-10-23 NOTE — Telephone Encounter (Signed)
Results called to patient and mentioned that he would have a repeat in 1 year

## 2014-10-23 NOTE — Telephone Encounter (Signed)
New message     Patient returning a call back to nurse from yesterday.

## 2014-12-11 ENCOUNTER — Encounter (INDEPENDENT_AMBULATORY_CARE_PROVIDER_SITE_OTHER): Payer: Managed Care, Other (non HMO) | Admitting: Ophthalmology

## 2014-12-11 DIAGNOSIS — E11359 Type 2 diabetes mellitus with proliferative diabetic retinopathy without macular edema: Secondary | ICD-10-CM | POA: Diagnosis not present

## 2014-12-11 DIAGNOSIS — H43811 Vitreous degeneration, right eye: Secondary | ICD-10-CM

## 2014-12-11 DIAGNOSIS — E11351 Type 2 diabetes mellitus with proliferative diabetic retinopathy with macular edema: Secondary | ICD-10-CM | POA: Diagnosis not present

## 2014-12-11 DIAGNOSIS — H35033 Hypertensive retinopathy, bilateral: Secondary | ICD-10-CM

## 2014-12-11 DIAGNOSIS — I10 Essential (primary) hypertension: Secondary | ICD-10-CM | POA: Diagnosis not present

## 2014-12-11 DIAGNOSIS — E11311 Type 2 diabetes mellitus with unspecified diabetic retinopathy with macular edema: Secondary | ICD-10-CM | POA: Diagnosis not present

## 2015-01-29 ENCOUNTER — Encounter (INDEPENDENT_AMBULATORY_CARE_PROVIDER_SITE_OTHER): Payer: Managed Care, Other (non HMO) | Admitting: Ophthalmology

## 2015-01-29 DIAGNOSIS — H35033 Hypertensive retinopathy, bilateral: Secondary | ICD-10-CM | POA: Diagnosis not present

## 2015-01-29 DIAGNOSIS — H26492 Other secondary cataract, left eye: Secondary | ICD-10-CM

## 2015-01-29 DIAGNOSIS — H43813 Vitreous degeneration, bilateral: Secondary | ICD-10-CM | POA: Diagnosis not present

## 2015-01-29 DIAGNOSIS — E11311 Type 2 diabetes mellitus with unspecified diabetic retinopathy with macular edema: Secondary | ICD-10-CM | POA: Diagnosis not present

## 2015-01-29 DIAGNOSIS — I10 Essential (primary) hypertension: Secondary | ICD-10-CM

## 2015-01-29 DIAGNOSIS — E113513 Type 2 diabetes mellitus with proliferative diabetic retinopathy with macular edema, bilateral: Secondary | ICD-10-CM | POA: Diagnosis not present

## 2015-02-19 ENCOUNTER — Other Ambulatory Visit (INDEPENDENT_AMBULATORY_CARE_PROVIDER_SITE_OTHER): Payer: Managed Care, Other (non HMO) | Admitting: Ophthalmology

## 2015-02-19 DIAGNOSIS — H26492 Other secondary cataract, left eye: Secondary | ICD-10-CM

## 2015-03-12 ENCOUNTER — Encounter (INDEPENDENT_AMBULATORY_CARE_PROVIDER_SITE_OTHER): Payer: Managed Care, Other (non HMO) | Admitting: Ophthalmology

## 2015-03-12 DIAGNOSIS — E113513 Type 2 diabetes mellitus with proliferative diabetic retinopathy with macular edema, bilateral: Secondary | ICD-10-CM

## 2015-03-12 DIAGNOSIS — I10 Essential (primary) hypertension: Secondary | ICD-10-CM | POA: Diagnosis not present

## 2015-03-12 DIAGNOSIS — H35033 Hypertensive retinopathy, bilateral: Secondary | ICD-10-CM | POA: Diagnosis not present

## 2015-03-12 DIAGNOSIS — H43811 Vitreous degeneration, right eye: Secondary | ICD-10-CM | POA: Diagnosis not present

## 2015-03-12 DIAGNOSIS — E11311 Type 2 diabetes mellitus with unspecified diabetic retinopathy with macular edema: Secondary | ICD-10-CM | POA: Diagnosis not present

## 2015-04-30 ENCOUNTER — Encounter (INDEPENDENT_AMBULATORY_CARE_PROVIDER_SITE_OTHER): Payer: Managed Care, Other (non HMO) | Admitting: Ophthalmology

## 2015-04-30 DIAGNOSIS — E113513 Type 2 diabetes mellitus with proliferative diabetic retinopathy with macular edema, bilateral: Secondary | ICD-10-CM | POA: Diagnosis not present

## 2015-04-30 DIAGNOSIS — H35033 Hypertensive retinopathy, bilateral: Secondary | ICD-10-CM | POA: Diagnosis not present

## 2015-04-30 DIAGNOSIS — E11311 Type 2 diabetes mellitus with unspecified diabetic retinopathy with macular edema: Secondary | ICD-10-CM

## 2015-04-30 DIAGNOSIS — I10 Essential (primary) hypertension: Secondary | ICD-10-CM

## 2015-04-30 DIAGNOSIS — H43813 Vitreous degeneration, bilateral: Secondary | ICD-10-CM

## 2015-06-11 ENCOUNTER — Encounter (INDEPENDENT_AMBULATORY_CARE_PROVIDER_SITE_OTHER): Payer: Managed Care, Other (non HMO) | Admitting: Ophthalmology

## 2015-06-11 DIAGNOSIS — E113593 Type 2 diabetes mellitus with proliferative diabetic retinopathy without macular edema, bilateral: Secondary | ICD-10-CM | POA: Diagnosis not present

## 2015-06-11 DIAGNOSIS — H43813 Vitreous degeneration, bilateral: Secondary | ICD-10-CM | POA: Diagnosis not present

## 2015-06-11 DIAGNOSIS — I10 Essential (primary) hypertension: Secondary | ICD-10-CM

## 2015-06-11 DIAGNOSIS — E11319 Type 2 diabetes mellitus with unspecified diabetic retinopathy without macular edema: Secondary | ICD-10-CM | POA: Diagnosis not present

## 2015-06-11 DIAGNOSIS — H35033 Hypertensive retinopathy, bilateral: Secondary | ICD-10-CM

## 2015-07-22 ENCOUNTER — Encounter: Payer: Self-pay | Admitting: *Deleted

## 2015-07-22 ENCOUNTER — Encounter: Payer: Managed Care, Other (non HMO) | Attending: Internal Medicine | Admitting: *Deleted

## 2015-07-22 DIAGNOSIS — E108 Type 1 diabetes mellitus with unspecified complications: Secondary | ICD-10-CM

## 2015-07-22 DIAGNOSIS — E1042 Type 1 diabetes mellitus with diabetic polyneuropathy: Secondary | ICD-10-CM | POA: Insufficient documentation

## 2015-07-22 DIAGNOSIS — Z794 Long term (current) use of insulin: Secondary | ICD-10-CM | POA: Insufficient documentation

## 2015-07-23 ENCOUNTER — Encounter (INDEPENDENT_AMBULATORY_CARE_PROVIDER_SITE_OTHER): Payer: Managed Care, Other (non HMO) | Admitting: Ophthalmology

## 2015-07-23 DIAGNOSIS — E11311 Type 2 diabetes mellitus with unspecified diabetic retinopathy with macular edema: Secondary | ICD-10-CM | POA: Diagnosis not present

## 2015-07-23 DIAGNOSIS — H35033 Hypertensive retinopathy, bilateral: Secondary | ICD-10-CM

## 2015-07-23 DIAGNOSIS — I10 Essential (primary) hypertension: Secondary | ICD-10-CM

## 2015-07-23 DIAGNOSIS — H4312 Vitreous hemorrhage, left eye: Secondary | ICD-10-CM

## 2015-07-23 DIAGNOSIS — E113512 Type 2 diabetes mellitus with proliferative diabetic retinopathy with macular edema, left eye: Secondary | ICD-10-CM

## 2015-07-23 DIAGNOSIS — E113591 Type 2 diabetes mellitus with proliferative diabetic retinopathy without macular edema, right eye: Secondary | ICD-10-CM

## 2015-07-23 DIAGNOSIS — H43811 Vitreous degeneration, right eye: Secondary | ICD-10-CM | POA: Diagnosis not present

## 2015-08-06 NOTE — Progress Notes (Signed)
Diabetes Self-Management Education  Visit Type: First/Initial  Appt. Start Time: 1400 Appt. End Time: 1500  08/06/2015  [name], identified by name and date of birth, is a 65 y.o. male with a diagnosis of Diabetes: Type 1.   ASSESSMENT Patient had Animas Ping insulin pump for past 5-6 years which he chose initially due to it being waterproof and he enjoys fishing. He has been on [employer] now with Dexcom for several years. He probably has about 2 years on his warranty, he's not quite sure, but he had the [facility] for 4 years and has been on pumps for about 6 years total.  He wears his Dexcom 100% of the time. He would like to consider the Medtronic 630G with Enlite sensor so that he would be in line to move to the 670G when it is shipping.    Individualized Plan for Diabetes Self-Management Training:   I educated the patient on the Medtronic 630G with Enlite sensor. He requested I notify the Medtronic Rep via email so he could pursue obtaining this if his insurance will cover it adequately.   Learning Objective:  Patient will have a greater understanding of diabetes self-management. Patient education plan is to attend individual and/or group sessions per assessed needs and concerns.   Plan:   Patient Instructions  Review information on Medtronic 630G  You've agreed for me to contact Rep for Medtronic so she can initiate  Call this office for training when product ships    Expected Outcomes:  Demonstrated interest in learning. Expect positive outcomes  Education material provided: Medtronic 630G brochure  If problems or questions, patient to contact team via:  Phone and Email  Future DSME appointment: PRN (Patient to call for training when pump and CGM ship

## 2015-08-06 NOTE — Patient Instructions (Signed)
Review information on Medtronic 630G and Dexcom Contact Rep for desired choice to initiate when ready Call this office for training when product ships

## 2015-09-03 ENCOUNTER — Encounter (INDEPENDENT_AMBULATORY_CARE_PROVIDER_SITE_OTHER): Payer: Managed Care, Other (non HMO) | Admitting: Ophthalmology

## 2015-09-03 DIAGNOSIS — E11311 Type 2 diabetes mellitus with unspecified diabetic retinopathy with macular edema: Secondary | ICD-10-CM

## 2015-09-03 DIAGNOSIS — H43813 Vitreous degeneration, bilateral: Secondary | ICD-10-CM | POA: Diagnosis not present

## 2015-09-03 DIAGNOSIS — H35033 Hypertensive retinopathy, bilateral: Secondary | ICD-10-CM | POA: Diagnosis not present

## 2015-09-03 DIAGNOSIS — E113513 Type 2 diabetes mellitus with proliferative diabetic retinopathy with macular edema, bilateral: Secondary | ICD-10-CM | POA: Diagnosis not present

## 2015-09-03 DIAGNOSIS — I10 Essential (primary) hypertension: Secondary | ICD-10-CM | POA: Diagnosis not present

## 2015-10-11 ENCOUNTER — Encounter (INDEPENDENT_AMBULATORY_CARE_PROVIDER_SITE_OTHER): Payer: Managed Care, Other (non HMO) | Admitting: Ophthalmology

## 2015-10-11 DIAGNOSIS — H35033 Hypertensive retinopathy, bilateral: Secondary | ICD-10-CM | POA: Diagnosis not present

## 2015-10-11 DIAGNOSIS — E113591 Type 2 diabetes mellitus with proliferative diabetic retinopathy without macular edema, right eye: Secondary | ICD-10-CM | POA: Diagnosis not present

## 2015-10-11 DIAGNOSIS — E113512 Type 2 diabetes mellitus with proliferative diabetic retinopathy with macular edema, left eye: Secondary | ICD-10-CM

## 2015-10-11 DIAGNOSIS — H43813 Vitreous degeneration, bilateral: Secondary | ICD-10-CM | POA: Diagnosis not present

## 2015-10-11 DIAGNOSIS — E11311 Type 2 diabetes mellitus with unspecified diabetic retinopathy with macular edema: Secondary | ICD-10-CM | POA: Diagnosis not present

## 2015-10-11 DIAGNOSIS — I10 Essential (primary) hypertension: Secondary | ICD-10-CM

## 2015-10-15 ENCOUNTER — Encounter (INDEPENDENT_AMBULATORY_CARE_PROVIDER_SITE_OTHER): Payer: Managed Care, Other (non HMO) | Admitting: Ophthalmology

## 2015-10-26 ENCOUNTER — Ambulatory Visit (INDEPENDENT_AMBULATORY_CARE_PROVIDER_SITE_OTHER): Payer: Managed Care, Other (non HMO) | Admitting: Interventional Cardiology

## 2015-10-26 ENCOUNTER — Encounter: Payer: Self-pay | Admitting: Interventional Cardiology

## 2015-10-26 VITALS — BP 118/60 | HR 66 | Ht 67.0 in | Wt 166.2 lb

## 2015-10-26 DIAGNOSIS — I779 Disorder of arteries and arterioles, unspecified: Secondary | ICD-10-CM

## 2015-10-26 DIAGNOSIS — E785 Hyperlipidemia, unspecified: Secondary | ICD-10-CM

## 2015-10-26 DIAGNOSIS — I739 Peripheral vascular disease, unspecified: Secondary | ICD-10-CM

## 2015-10-26 DIAGNOSIS — I10 Essential (primary) hypertension: Secondary | ICD-10-CM | POA: Diagnosis not present

## 2015-10-26 DIAGNOSIS — I2510 Atherosclerotic heart disease of native coronary artery without angina pectoris: Secondary | ICD-10-CM | POA: Diagnosis not present

## 2015-10-26 NOTE — Patient Instructions (Signed)
Medication Instructions:  Same-no changes  Labwork: None  Testing/Procedures: Your physician has requested that you have a carotid duplex. This test is an ultrasound of the carotid arteries in your neck. It looks at blood flow through these arteries that supply the brain with blood. Allow one hour for this exam. There are no restrictions or special instructions.  Your physician has requested that you have an exercise tolerance test. For further information please visit [email or web address]. Please also follow instruction sheet, as given.   Follow-Up: 1 year.     If you need a refill on your cardiac medications before your next appointment, please call your pharmacy.

## 2015-10-26 NOTE — Progress Notes (Signed)
Patient ID: Jeremy Johnston, male   DOB: 1950/09/30, 65 y.o.   MRN: [id number]     Cardiology Office Note   Date:  10/26/2015   ID:  Jeremy Johnston, DOB [birth year], MRN [id number]  PCP:  Jeremy Hauser, MD  Jeremy Johnston primary; Jeremy Johnston.    No chief complaint on file.  CAD  Wt Readings from Last 3 Encounters:  10/26/15 166 lb 3.2 oz (75.4 kg)  10/15/14 169 lb 6.4 oz (76.8 kg)  09/01/13 156 lb (70.8 kg)       History of Present Illness: Jeremy Johnston is a 65 y.o. male  who has had DM and CAD. DM diagnosed in 1986. He moved to [place] recently. He had CABG in 2008. He was essentially asypmtomatic at the time of his CABG. He had a stress test in 2011 with a small inferior defect. He had a ETT in 2014 which was unremarkable per his report. He is alternating nuclear tests and ETT on an annual basis.     He uses the treadmill intermittently.  He has been bowling more of late- 3x/week. He walks up stairs and up hill to get to work. No problems with that walking. Has a long walk from his parking lot to work.  No problems.  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 He had an ETT (9 min on treadmill) in 6/16 which was low risk.  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  He prefers to have a stress test given his lack of sx at the time of diagnosis of his CAD.     Past Medical History:  Diagnosis Date  . Cancer ([place])    kidney cancer-both kidneys remain  . Chronic kidney disease    Robotic Kidney surgery for kidney cancer tumor-kidney remains-  . Coronary artery disease   . Diabetes mellitus (HCC)    Insulin pump -[name], and has a glucose transmitter attached.  . Hyperlipidemia   . Hypertension     Past Surgical History:  Procedure Laterality Date  . CATARACT EXTRACTION, BILATERAL Bilateral    11'15  . CORONARY ARTERY BYPASS GRAFT  10/08   x4 vessels([place])  . DIAGNOSTIC LAPAROSCOPY    . EYE  SURGERY     retina peele  . KIDNEY SURGERY     tumor removed  . VASECTOMY       Current Outpatient Prescriptions  Medication Sig Dispense Refill  . quinapril-hydrochlorothiazide (ACCURETIC) 20-12.5 MG tablet Take 0.5 tablets by mouth daily. TAKE 1/2 TAB BY MOUTH DAILY    . aspirin 81 MG tablet Take 81 mg by mouth daily.    . clopidogrel (PLAVIX) 75 MG tablet Take 1 tablet (75 mg total) by mouth daily with breakfast. 90 tablet 3  . Insulin Human (INSULIN PUMP) 100 unit/ml SOLN Inject into the skin.    . Multiple Vitamin (MULTIVITAMIN WITH MINERALS) TABS tablet Take 1 tablet by mouth daily.    . rosuvastatin (CRESTOR) 40 MG tablet Take 40 mg by mouth every evening.      No current facility-administered medications for this visit.     Allergies:   Review of patient's allergies indicates no known allergies.    Social History:  The patient  reports that he has never smoked. He does not have any smokeless tobacco history on file. He reports that he does not drink alcohol or use drugs.   Family History:  The patient's family history includes Diabetes in his sister; Heart attack in his father and sister.    ROS:  Please see the history of present illness.   Otherwise, review of systems are positive for right sided heart burn- resolved after stopping coffee.   All other systems are reviewed and negative.    PHYSICAL EXAM: VS:  BP 118/60   Pulse 66   Ht 5\' 7"  (1.702 m)   Wt 166 lb 3.2 oz (75.4 kg)   BMI 26.03 kg/m  , BMI Body mass index is 26.03 kg/m. GEN: Well nourished, well developed, in no acute distress  HEENT: normal  Neck: no JVD, carotid bruits, or masses Cardiac: RRR; no murmurs, rubs, or gallops,no edema  Respiratory:  clear to auscultation bilaterally, normal work of breathing GI: soft, nontender, nondistended, + BS MS: no deformity or atrophy  Skin: warm and dry, no rash Neuro:  Strength and sensation are intact Psych: euthymic mood, full affect   EKG:   The ekg  ordered today demonstrates NSR, NSST   Recent Labs: No results found for requested labs within last 8760 hours.   Lipid Panel No results found for: CHOL, TRIG, HDL, CHOLHDL, VLDL, LDLCALC, LDLDIRECT   Other studies Reviewed: Additional studies/ records that were reviewed today with results demonstrating: stress test result as noted.   ASSESSMENT AND PLAN:  1. CAD:  No angina, but he did not have angina prior to CABG.  He had an abnormal ECG at the time of diagnosis. Most Recent stress test (ETT) was low risk. Continue aggressive secondary prevention. No bleeding problems. Refill clopidogrel.  No internal bleeding.  Plan for ETT.  Avoid nuclear stress due to avoiding radiation and the fact that he does not keep a pilots license.  2. Carotid atherosclerosis:  Will Repeat carotid Doppler.  Moderate disease. Last check in May 2015.   3. Hyperlipidemia:  Continue current lipid-lowering therapy. He has labs checked with Dr. Buddy Johnston. Will have there is labs  Sent to [place] after the next check. A1C 6.8. LDL 108 in 2/16.  LDL 71 in 2/15.  4. HTN:  Well controlled. Continue current medicines. Refill quinapril/HCTZ.   Current medicines are reviewed at length with the patient today.  The patient concerns regarding his medicines were addressed.  The following changes have been made:  No change  Labs/ tests ordered today include:   No orders of the defined types were placed in this encounter.   Recommend 150 minutes/week of aerobic exercise:  Stressed importance of increasing frequency and consistency of exercise. Low fat, low carb, high fiber diet recommended  Disposition:   FU in 1 year   Signed, Jeremy Grooms, MD  10/26/2015 10:01 AM    [facility] [place], [place],   [address] Phone: [phone]; Fax: [phone]

## 2015-10-27 ENCOUNTER — Encounter (HOSPITAL_COMMUNITY): Payer: Managed Care, Other (non HMO)

## 2015-10-27 ENCOUNTER — Ambulatory Visit (HOSPITAL_COMMUNITY)
Admission: RE | Admit: 2015-10-27 | Discharge: 2015-10-27 | Disposition: A | Discharge status: Home / Self Care | Payer: Managed Care, Other (non HMO) | Source: Ambulatory Visit | Attending: Interventional Cardiology | Admitting: Interventional Cardiology

## 2015-10-27 DIAGNOSIS — E1122 Type 2 diabetes mellitus with diabetic chronic kidney disease: Secondary | ICD-10-CM | POA: Diagnosis not present

## 2015-10-27 DIAGNOSIS — I6523 Occlusion and stenosis of bilateral carotid arteries: Secondary | ICD-10-CM | POA: Insufficient documentation

## 2015-10-27 DIAGNOSIS — I779 Disorder of arteries and arterioles, unspecified: Secondary | ICD-10-CM | POA: Insufficient documentation

## 2015-10-27 DIAGNOSIS — N189 Chronic kidney disease, unspecified: Secondary | ICD-10-CM | POA: Diagnosis not present

## 2015-10-27 DIAGNOSIS — E785 Hyperlipidemia, unspecified: Secondary | ICD-10-CM | POA: Diagnosis not present

## 2015-10-27 DIAGNOSIS — I129 Hypertensive chronic kidney disease with stage 1 through stage 4 chronic kidney disease, or unspecified chronic kidney disease: Secondary | ICD-10-CM | POA: Diagnosis not present

## 2015-10-27 DIAGNOSIS — I739 Peripheral vascular disease, unspecified: Secondary | ICD-10-CM

## 2015-11-08 ENCOUNTER — Ambulatory Visit (INDEPENDENT_AMBULATORY_CARE_PROVIDER_SITE_OTHER): Payer: Managed Care, Other (non HMO)

## 2015-11-08 DIAGNOSIS — I2510 Atherosclerotic heart disease of native coronary artery without angina pectoris: Secondary | ICD-10-CM

## 2015-11-08 LAB — EXERCISE TOLERANCE TEST
CHL CUP RESTING HR STRESS: 71 {beats}/min
CHL CUP STRESS STAGE 1 DBP: 70 mmHg
CHL CUP STRESS STAGE 1 GRADE: 0 %
CHL CUP STRESS STAGE 1 HR: 88 {beats}/min
CHL CUP STRESS STAGE 10 DBP: 61 mmHg
CHL CUP STRESS STAGE 10 SPEED: 0 mph
CHL CUP STRESS STAGE 2 GRADE: 0 %
CHL CUP STRESS STAGE 2 SPEED: 0 mph
CHL CUP STRESS STAGE 3 GRADE: 0 %
CHL CUP STRESS STAGE 3 HR: 92 {beats}/min
CHL CUP STRESS STAGE 4 GRADE: 0 %
CHL CUP STRESS STAGE 5 HR: 100 {beats}/min
CHL CUP STRESS STAGE 6 GRADE: 12 %
CHL CUP STRESS STAGE 6 SPEED: 2.5 mph
CHL CUP STRESS STAGE 7 HR: 141 {beats}/min
CHL CUP STRESS STAGE 7 SBP: 169 mmHg
CHL CUP STRESS STAGE 7 SPEED: 3.4 mph
CHL CUP STRESS STAGE 8 GRADE: 14.2 %
CHL CUP STRESS STAGE 8 HR: 141 {beats}/min
CHL CUP STRESS STAGE 8 SPEED: 3.3 mph
CHL CUP STRESS STAGE 9 DBP: 64 mmHg
CHL CUP STRESS STAGE 9 GRADE: 0 %
CHL RATE OF PERCEIVED EXERTION: 15
CSEPED: 9 min
CSEPEDS: 0 s
CSEPEW: 10.1 METS
CSEPHR: 90 %
MPHR: 156 {beats}/min
Peak HR: 141 {beats}/min
Percent of predicted max HR: 90 %
Stage 1 SBP: 138 mmHg
Stage 1 Speed: 0 mph
Stage 10 Grade: 0 %
Stage 10 HR: 96 {beats}/min
Stage 10 SBP: 131 mmHg
Stage 2 HR: 92 {beats}/min
Stage 3 Speed: 1 mph
Stage 4 HR: 92 {beats}/min
Stage 4 Speed: 1 mph
Stage 5 DBP: 65 mmHg
Stage 5 Grade: 10 %
Stage 5 SBP: 149 mmHg
Stage 5 Speed: 1.7 mph
Stage 6 DBP: 60 mmHg
Stage 6 HR: 113 {beats}/min
Stage 6 SBP: 149 mmHg
Stage 7 DBP: 60 mmHg
Stage 7 Grade: 14 %
Stage 9 HR: 129 {beats}/min
Stage 9 SBP: 165 mmHg
Stage 9 Speed: 1.5 mph

## 2015-11-10 ENCOUNTER — Telehealth: Payer: Self-pay | Admitting: Interventional Cardiology

## 2015-11-10 NOTE — Telephone Encounter (Signed)
New message ° ° ° °Pt verbalized that he is returning rn call °

## 2015-11-12 NOTE — Telephone Encounter (Signed)
**Note De-Identified Jeremy Johnston Obfuscation** The pt has been given his stress test results and he verbalized understanding.

## 2015-11-26 ENCOUNTER — Encounter (INDEPENDENT_AMBULATORY_CARE_PROVIDER_SITE_OTHER): Payer: Managed Care, Other (non HMO) | Admitting: Ophthalmology

## 2015-11-26 DIAGNOSIS — H35033 Hypertensive retinopathy, bilateral: Secondary | ICD-10-CM

## 2015-11-26 DIAGNOSIS — I10 Essential (primary) hypertension: Secondary | ICD-10-CM | POA: Diagnosis not present

## 2015-11-26 DIAGNOSIS — H43813 Vitreous degeneration, bilateral: Secondary | ICD-10-CM | POA: Diagnosis not present

## 2015-11-26 DIAGNOSIS — E113591 Type 2 diabetes mellitus with proliferative diabetic retinopathy without macular edema, right eye: Secondary | ICD-10-CM

## 2015-11-26 DIAGNOSIS — E113512 Type 2 diabetes mellitus with proliferative diabetic retinopathy with macular edema, left eye: Secondary | ICD-10-CM

## 2015-11-26 DIAGNOSIS — E11311 Type 2 diabetes mellitus with unspecified diabetic retinopathy with macular edema: Secondary | ICD-10-CM | POA: Diagnosis not present

## 2015-11-28 ENCOUNTER — Other Ambulatory Visit: Payer: Self-pay | Admitting: Interventional Cardiology

## 2015-12-24 ENCOUNTER — Other Ambulatory Visit: Payer: Self-pay | Admitting: Interventional Cardiology

## 2016-01-07 ENCOUNTER — Encounter (INDEPENDENT_AMBULATORY_CARE_PROVIDER_SITE_OTHER): Payer: Managed Care, Other (non HMO) | Admitting: Ophthalmology

## 2016-01-07 DIAGNOSIS — H35033 Hypertensive retinopathy, bilateral: Secondary | ICD-10-CM | POA: Diagnosis not present

## 2016-01-07 DIAGNOSIS — E11311 Type 2 diabetes mellitus with unspecified diabetic retinopathy with macular edema: Secondary | ICD-10-CM | POA: Diagnosis not present

## 2016-01-07 DIAGNOSIS — E113513 Type 2 diabetes mellitus with proliferative diabetic retinopathy with macular edema, bilateral: Secondary | ICD-10-CM

## 2016-01-07 DIAGNOSIS — H43813 Vitreous degeneration, bilateral: Secondary | ICD-10-CM | POA: Diagnosis not present

## 2016-01-07 DIAGNOSIS — I10 Essential (primary) hypertension: Secondary | ICD-10-CM | POA: Diagnosis not present

## 2016-02-18 ENCOUNTER — Encounter (INDEPENDENT_AMBULATORY_CARE_PROVIDER_SITE_OTHER): Payer: Managed Care, Other (non HMO) | Admitting: Ophthalmology

## 2016-02-18 DIAGNOSIS — E113512 Type 2 diabetes mellitus with proliferative diabetic retinopathy with macular edema, left eye: Secondary | ICD-10-CM

## 2016-02-18 DIAGNOSIS — H35033 Hypertensive retinopathy, bilateral: Secondary | ICD-10-CM

## 2016-02-18 DIAGNOSIS — E11311 Type 2 diabetes mellitus with unspecified diabetic retinopathy with macular edema: Secondary | ICD-10-CM | POA: Diagnosis not present

## 2016-02-18 DIAGNOSIS — I10 Essential (primary) hypertension: Secondary | ICD-10-CM

## 2016-02-18 DIAGNOSIS — E113591 Type 2 diabetes mellitus with proliferative diabetic retinopathy without macular edema, right eye: Secondary | ICD-10-CM | POA: Diagnosis not present

## 2016-02-18 DIAGNOSIS — H43813 Vitreous degeneration, bilateral: Secondary | ICD-10-CM

## 2016-04-14 ENCOUNTER — Encounter (INDEPENDENT_AMBULATORY_CARE_PROVIDER_SITE_OTHER): Payer: Managed Care, Other (non HMO) | Admitting: Ophthalmology

## 2016-05-05 ENCOUNTER — Encounter (INDEPENDENT_AMBULATORY_CARE_PROVIDER_SITE_OTHER): Payer: Managed Care, Other (non HMO) | Admitting: Ophthalmology

## 2016-05-05 DIAGNOSIS — H43813 Vitreous degeneration, bilateral: Secondary | ICD-10-CM | POA: Diagnosis not present

## 2016-05-05 DIAGNOSIS — I10 Essential (primary) hypertension: Secondary | ICD-10-CM | POA: Diagnosis not present

## 2016-05-05 DIAGNOSIS — E11311 Type 2 diabetes mellitus with unspecified diabetic retinopathy with macular edema: Secondary | ICD-10-CM

## 2016-05-05 DIAGNOSIS — E113513 Type 2 diabetes mellitus with proliferative diabetic retinopathy with macular edema, bilateral: Secondary | ICD-10-CM

## 2016-05-05 DIAGNOSIS — H35033 Hypertensive retinopathy, bilateral: Secondary | ICD-10-CM | POA: Diagnosis not present

## 2016-06-02 ENCOUNTER — Encounter (INDEPENDENT_AMBULATORY_CARE_PROVIDER_SITE_OTHER): Payer: Managed Care, Other (non HMO) | Admitting: Ophthalmology

## 2016-06-02 DIAGNOSIS — H35033 Hypertensive retinopathy, bilateral: Secondary | ICD-10-CM | POA: Diagnosis not present

## 2016-06-02 DIAGNOSIS — E11311 Type 2 diabetes mellitus with unspecified diabetic retinopathy with macular edema: Secondary | ICD-10-CM

## 2016-06-02 DIAGNOSIS — I10 Essential (primary) hypertension: Secondary | ICD-10-CM

## 2016-06-02 DIAGNOSIS — E113513 Type 2 diabetes mellitus with proliferative diabetic retinopathy with macular edema, bilateral: Secondary | ICD-10-CM | POA: Diagnosis not present

## 2016-06-02 DIAGNOSIS — H43813 Vitreous degeneration, bilateral: Secondary | ICD-10-CM | POA: Diagnosis not present

## 2016-07-14 ENCOUNTER — Encounter (INDEPENDENT_AMBULATORY_CARE_PROVIDER_SITE_OTHER): Payer: Managed Care, Other (non HMO) | Admitting: Ophthalmology

## 2016-07-14 DIAGNOSIS — H43813 Vitreous degeneration, bilateral: Secondary | ICD-10-CM

## 2016-07-14 DIAGNOSIS — E11311 Type 2 diabetes mellitus with unspecified diabetic retinopathy with macular edema: Secondary | ICD-10-CM

## 2016-07-14 DIAGNOSIS — E113513 Type 2 diabetes mellitus with proliferative diabetic retinopathy with macular edema, bilateral: Secondary | ICD-10-CM

## 2016-07-14 DIAGNOSIS — I10 Essential (primary) hypertension: Secondary | ICD-10-CM | POA: Diagnosis not present

## 2016-07-14 DIAGNOSIS — H35033 Hypertensive retinopathy, bilateral: Secondary | ICD-10-CM

## 2016-08-07 ENCOUNTER — Other Ambulatory Visit: Payer: Self-pay | Admitting: Interventional Cardiology

## 2016-09-01 ENCOUNTER — Encounter (INDEPENDENT_AMBULATORY_CARE_PROVIDER_SITE_OTHER): Payer: Managed Care, Other (non HMO) | Admitting: Ophthalmology

## 2016-09-01 DIAGNOSIS — I10 Essential (primary) hypertension: Secondary | ICD-10-CM | POA: Diagnosis not present

## 2016-09-01 DIAGNOSIS — E113513 Type 2 diabetes mellitus with proliferative diabetic retinopathy with macular edema, bilateral: Secondary | ICD-10-CM

## 2016-09-01 DIAGNOSIS — H43813 Vitreous degeneration, bilateral: Secondary | ICD-10-CM

## 2016-09-01 DIAGNOSIS — E11311 Type 2 diabetes mellitus with unspecified diabetic retinopathy with macular edema: Secondary | ICD-10-CM | POA: Diagnosis not present

## 2016-09-01 DIAGNOSIS — H35373 Puckering of macula, bilateral: Secondary | ICD-10-CM

## 2016-09-01 DIAGNOSIS — H35033 Hypertensive retinopathy, bilateral: Secondary | ICD-10-CM

## 2016-09-29 ENCOUNTER — Encounter (INDEPENDENT_AMBULATORY_CARE_PROVIDER_SITE_OTHER): Payer: Managed Care, Other (non HMO) | Admitting: Ophthalmology

## 2016-09-29 DIAGNOSIS — H35033 Hypertensive retinopathy, bilateral: Secondary | ICD-10-CM | POA: Diagnosis not present

## 2016-09-29 DIAGNOSIS — E11311 Type 2 diabetes mellitus with unspecified diabetic retinopathy with macular edema: Secondary | ICD-10-CM

## 2016-09-29 DIAGNOSIS — H43813 Vitreous degeneration, bilateral: Secondary | ICD-10-CM

## 2016-09-29 DIAGNOSIS — I10 Essential (primary) hypertension: Secondary | ICD-10-CM

## 2016-09-29 DIAGNOSIS — E113512 Type 2 diabetes mellitus with proliferative diabetic retinopathy with macular edema, left eye: Secondary | ICD-10-CM | POA: Diagnosis not present

## 2016-09-29 DIAGNOSIS — E113591 Type 2 diabetes mellitus with proliferative diabetic retinopathy without macular edema, right eye: Secondary | ICD-10-CM

## 2016-10-03 ENCOUNTER — Encounter: Payer: Self-pay | Admitting: *Deleted

## 2016-10-17 ENCOUNTER — Ambulatory Visit (INDEPENDENT_AMBULATORY_CARE_PROVIDER_SITE_OTHER): Payer: Managed Care, Other (non HMO) | Admitting: Interventional Cardiology

## 2016-10-17 ENCOUNTER — Encounter: Payer: Self-pay | Admitting: Interventional Cardiology

## 2016-10-17 VITALS — BP 110/52 | HR 69 | Ht 67.0 in | Wt 165.1 lb

## 2016-10-17 DIAGNOSIS — E1159 Type 2 diabetes mellitus with other circulatory complications: Secondary | ICD-10-CM

## 2016-10-17 DIAGNOSIS — E782 Mixed hyperlipidemia: Secondary | ICD-10-CM | POA: Diagnosis not present

## 2016-10-17 DIAGNOSIS — I119 Hypertensive heart disease without heart failure: Secondary | ICD-10-CM | POA: Diagnosis not present

## 2016-10-17 DIAGNOSIS — I25119 Atherosclerotic heart disease of native coronary artery with unspecified angina pectoris: Secondary | ICD-10-CM | POA: Diagnosis not present

## 2016-10-17 DIAGNOSIS — I779 Disorder of arteries and arterioles, unspecified: Secondary | ICD-10-CM

## 2016-10-17 DIAGNOSIS — Z794 Long term (current) use of insulin: Secondary | ICD-10-CM | POA: Diagnosis not present

## 2016-10-17 DIAGNOSIS — I739 Peripheral vascular disease, unspecified: Secondary | ICD-10-CM

## 2016-10-17 MED ORDER — EZETIMIBE 10 MG PO TABS: 10.0000 mg | ORAL_TABLET | Freq: Every day | ORAL | 3 refills | Status: DC

## 2016-10-17 NOTE — Progress Notes (Signed)
Cardiology Office Note   Date:  10/17/2016   ID:  Jeremy Johnston, DOB 03-31-1951, MRN [id number]  PCP:  Jeremy Hauser, MD    Chief Complaint  Patient presents with  . Follow-up    1 year   CAD  Wt Readings from Last 3 Encounters:  10/17/16 165 lb 1.9 oz (74.9 kg)  10/26/15 166 lb 3.2 oz (75.4 kg)  10/15/14 169 lb 6.4 oz (76.8 kg)       History of Present Illness: Jeremy Johnston is a 66 y.o. male  who has had DM and CAD. DM diagnosed in 1986.He had CABG in 2008. He was essentially asypmtomatic at the time of his CABG.Change on routine ECG in 2008 prompted stress testing and w/u that led to CABG.    He had a stress test in 2011 with a small inferior defect. He had a ETT in 2014 which was unremarkable per his report. He is alternating nuclear tests and ETT on an annual basis.    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 He had an ETT (9 min on treadmill) in 6/16 which was low risk.  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  Normal ETT in 8/17.   He prefers to have a stress test given his lack of sx at the time of diagnosis of his CAD.  Denies : Chest pain. Dizziness. Leg edema. Nitroglycerin use. Orthopnea. Palpitations. Paroxysmal nocturnal dyspnea. Shortness of breath. Syncope.   Occasional lightheadedness.  BP in the 110/60.  He continues to bowl 3-4x/week.  No cardiac sx with this activity.  No problems going up stairs.      Past Medical History:  Diagnosis Date  . Cancer ([place])    kidney cancer-both kidneys remain  . Chronic kidney disease    Robotic Kidney surgery for kidney cancer tumor-kidney remains-  . Coronary artery disease   . Diabetes mellitus (HCC)    Insulin pump -[name], and has a glucose transmitter attached.  . Hyperlipidemia   . Hypertension     Past Surgical History:  Procedure Laterality Date  . CATARACT EXTRACTION, BILATERAL Bilateral    11'15  . CORONARY ARTERY  BYPASS GRAFT  10/08   x4 vessels([place])  . DIAGNOSTIC LAPAROSCOPY    . EYE SURGERY     retina peele  . KIDNEY SURGERY     tumor removed  . VASECTOMY       Current Outpatient Prescriptions  Medication Sig Dispense Refill  . aspirin 81 MG tablet Take 81 mg by mouth daily.    Marland Kitchen Besifloxacin HCl (BESIVANCE OP) Apply 1 drop to eye 4 (four) times daily. The day of and 2 days after eye injection    . brimonidine (ALPHAGAN) 0.15 % ophthalmic solution Place 1 drop into the right eye 2 (two) times daily.  11  . clopidogrel (PLAVIX) 75 MG tablet TAKE 1 TABLET BY MOUTH DAILY WITH BREAKFAST 90 tablet 3  . dorzolamide-timolol (COSOPT) 22.3-6.8 MG/ML ophthalmic solution Place 1 drop into the right eye 2 (two) times daily.    . Insulin Human (INSULIN PUMP) 100 unit/ml SOLN Inject into the skin. Use with insulin as directed    . Multiple Vitamin (MULTIVITAMIN WITH MINERALS) TABS tablet Take 1 tablet by mouth daily.    Marland Kitchen NOVOLOG 100 UNIT/ML injection Inject 1 Units into the skin as directed. Patient uses with a insulin pump. Starts as 1 unit  per hour    . quinapril-hydrochlorothiazide (ACCURETIC) 20-12.5 MG tablet Take 0.5 tablets by mouth daily. *Please call and schedule a one year follow up appointment* 45 tablet 0  . rosuvastatin (CRESTOR) 40 MG tablet Take 40 mg by mouth every evening.      No current facility-administered medications for this visit.     Allergies:   Patient has no known allergies.    Social History:  The patient  reports that he has never smoked. He has never used smokeless tobacco. He reports that he does not drink alcohol or use drugs.   Family History:  The patient's family history includes Diabetes in his sister; Heart attack in his father and sister.    ROS:  Please see the history of present illness.   Otherwise, review of systems are positive for occasional lightheadedness.   All other systems are reviewed and negative.    PHYSICAL EXAM: VS:  BP (!) 110/52   Pulse  69   Ht 5\' 7"  (1.702 m)   Wt 165 lb 1.9 oz (74.9 kg)   BMI 25.86 kg/m  , BMI Body mass index is 25.86 kg/m. GEN: Well nourished, well developed, in no acute distress  HEENT: normal ; cyst on upper chest/lower neck Neck: no JVD, carotid bruits, or masses Cardiac: RRR; no murmurs, rubs, or gallops,no edema  Respiratory:  clear to auscultation bilaterally, normal work of breathing GI: soft, nontender, nondistended, + BS MS: no deformity or atrophy  Skin: warm and dry, no rash Neuro:  Strength and sensation are intact Psych: euthymic mood, full affect   EKG:   The ekg ordered today demonstrates NSR, inf Q waves, NSST, unchanged from prior ECG in 2016   Recent Labs: No results found for requested labs within last 8760 hours.   Lipid Panel No results found for: CHOL, TRIG, HDL, CHOLHDL, VLDL, LDLCALC, LDLDIRECT   Other studies Reviewed: Additional studies/ records that were reviewed today with results demonstrating: 2017 stress test reviewed.   ASSESSMENT AND PLAN:  1. CAD: No angina.  Plan for ETT as he did not have classic angina prior to his CABG.  Continue aggresive secondary prevention.  Change on ECG in 2008 prompted stress testing and w/u that led to CABG. No bleeding issues on DAPT.   2. DM: Followed by Dr. Buddy Johnston.  Last A1C 7.6. 3. Hyperlipidemia: LDL 110 in 2/18.  This is above target.  Add Zetia 10 mg daily.  Continue Crestor.  Labs to be checked with Dr. Buddy Johnston.  Would have to consider PCSK9 inhibitor if we cannot get him to target. 4. Hypertensive heart disease: BP well controlled. COntinue ARB.   5. Carotid disease: Moderate carotid disease. Plan for repeat Doppler in 8/18.     Current medicines are reviewed at length with the patient today.  The patient concerns regarding his medicines were addressed.  The following changes have been made:  Add Zetia  Labs/ tests ordered today include:  No orders of the defined types were placed in this encounter.   Recommend 150  minutes/week of aerobic exercise Low fat, low carb, high fiber diet recommended  Disposition:   FU in 1 year   Signed, Jeremy Grooms, MD  10/17/2016 9:12 AM    [facility] [place], [place], [place]  [address] Phone: [phone]; Fax: [phone]

## 2016-10-17 NOTE — Patient Instructions (Signed)
Medication Instructions:  Your physician has recommended you make the following change in your medication:   START zetia 10 mg daily   Labwork: None ordered  Testing/Procedures: Your physician has requested that you have a carotid duplex. This test is an ultrasound of the carotid arteries in your neck. It looks at blood flow through these arteries that supply the brain with blood. Allow one hour for this exam. There are no restrictions or special instructions.  Your physician has requested that you have an exercise tolerance test. For further information please visit [email or web address]. Please also follow instruction sheet, as given.    Follow-Up: Your physician wants you to follow-up in: 1 year with Dr. Irish Lack. You will receive a reminder letter in the mail two months in advance. If you don't receive a letter, please call our office to schedule the follow-up appointment.   Any Other Special Instructions Will Be Listed Below (If Applicable).     If you need a refill on your cardiac medications before your next appointment, please call your pharmacy.

## 2016-10-24 ENCOUNTER — Other Ambulatory Visit: Payer: Self-pay | Admitting: Interventional Cardiology

## 2016-11-06 ENCOUNTER — Ambulatory Visit (HOSPITAL_COMMUNITY)
Admission: RE | Admit: 2016-11-06 | Discharge: 2016-11-06 | Disposition: A | Discharge status: Home / Self Care | Payer: Managed Care, Other (non HMO) | Source: Ambulatory Visit | Attending: Cardiology | Admitting: Cardiology

## 2016-11-06 ENCOUNTER — Ambulatory Visit (INDEPENDENT_AMBULATORY_CARE_PROVIDER_SITE_OTHER): Payer: Managed Care, Other (non HMO)

## 2016-11-06 DIAGNOSIS — I6523 Occlusion and stenosis of bilateral carotid arteries: Secondary | ICD-10-CM | POA: Diagnosis not present

## 2016-11-06 DIAGNOSIS — I779 Disorder of arteries and arterioles, unspecified: Secondary | ICD-10-CM

## 2016-11-06 DIAGNOSIS — I25119 Atherosclerotic heart disease of native coronary artery with unspecified angina pectoris: Secondary | ICD-10-CM | POA: Diagnosis not present

## 2016-11-06 DIAGNOSIS — I739 Peripheral vascular disease, unspecified: Secondary | ICD-10-CM

## 2016-11-06 LAB — EXERCISE TOLERANCE TEST
CSEPEDS: 41 s
CSEPEW: 11.2 METS
Exercise duration (min): 9 min
MPHR: 155 {beats}/min
Peak HR: 150 {beats}/min
Percent HR: 97 %
RPE: 15
Rest HR: 59 {beats}/min

## 2016-11-09 ENCOUNTER — Other Ambulatory Visit: Payer: Self-pay

## 2016-11-09 DIAGNOSIS — I739 Peripheral vascular disease, unspecified: Principal | ICD-10-CM

## 2016-11-09 DIAGNOSIS — I779 Disorder of arteries and arterioles, unspecified: Secondary | ICD-10-CM

## 2016-11-10 ENCOUNTER — Encounter (INDEPENDENT_AMBULATORY_CARE_PROVIDER_SITE_OTHER): Payer: Managed Care, Other (non HMO) | Admitting: Ophthalmology

## 2016-11-10 DIAGNOSIS — H43813 Vitreous degeneration, bilateral: Secondary | ICD-10-CM

## 2016-11-10 DIAGNOSIS — E113512 Type 2 diabetes mellitus with proliferative diabetic retinopathy with macular edema, left eye: Secondary | ICD-10-CM

## 2016-11-10 DIAGNOSIS — E11311 Type 2 diabetes mellitus with unspecified diabetic retinopathy with macular edema: Secondary | ICD-10-CM

## 2016-11-10 DIAGNOSIS — E113591 Type 2 diabetes mellitus with proliferative diabetic retinopathy without macular edema, right eye: Secondary | ICD-10-CM

## 2016-11-22 ENCOUNTER — Telehealth: Payer: Self-pay | Admitting: Interventional Cardiology

## 2016-11-22 ENCOUNTER — Encounter (INDEPENDENT_AMBULATORY_CARE_PROVIDER_SITE_OTHER): Payer: Managed Care, Other (non HMO) | Admitting: Ophthalmology

## 2016-11-22 DIAGNOSIS — E11311 Type 2 diabetes mellitus with unspecified diabetic retinopathy with macular edema: Secondary | ICD-10-CM

## 2016-11-22 DIAGNOSIS — E113511 Type 2 diabetes mellitus with proliferative diabetic retinopathy with macular edema, right eye: Secondary | ICD-10-CM

## 2016-11-22 DIAGNOSIS — H43813 Vitreous degeneration, bilateral: Secondary | ICD-10-CM

## 2016-11-22 DIAGNOSIS — I10 Essential (primary) hypertension: Secondary | ICD-10-CM

## 2016-11-22 DIAGNOSIS — H35033 Hypertensive retinopathy, bilateral: Secondary | ICD-10-CM

## 2016-11-22 DIAGNOSIS — H4312 Vitreous hemorrhage, left eye: Secondary | ICD-10-CM | POA: Diagnosis not present

## 2016-11-22 NOTE — Telephone Encounter (Signed)
New message   Pt c/o medication issue:  1. Name of Medication: aspirin 81 MG tablet  & clopidogrel (PLAVIX) 75 MG tablet  2. How are you currently taking this medication (dosage and times per day)? Aspirin Take 81 mg by mouth daily. /  Clopidogrel TAKE 1 TABLET BY MOUTH DAILY WITH BREAKFAST  3. Are you having a reaction (difficulty breathing--STAT)? No   4. What is your medication issue? Hemorrhage in his eye

## 2016-11-22 NOTE — Telephone Encounter (Signed)
Patient calling and states that he was seen by Dr. Zigmund Daniel today, his retina specialist, and that he has a small hemorrhage in his left eye. Patient states that Dr. Zigmund Daniel advised the patient to call cardiology to see if he would be able to hold his ASA or Plavix for a short period of time to allow time for it to clear up. Patient made aware that the information would be forwarded to Dr. Irish Lack for review and recommendation. Patient verbalized understanding and thanked me for the call.

## 2016-11-23 NOTE — Telephone Encounter (Signed)
OK to hold both aspirin and plavix for 2 weeks.  Can restart plavix after two weeks.  COntinue to hold aspirin.

## 2016-11-23 NOTE — Telephone Encounter (Signed)
Patient made aware of Dr. Hassell Done recommendations. Patient verbalized understanding and thanked me for the call.

## 2016-12-07 ENCOUNTER — Encounter (INDEPENDENT_AMBULATORY_CARE_PROVIDER_SITE_OTHER): Payer: Managed Care, Other (non HMO) | Admitting: Ophthalmology

## 2016-12-07 ENCOUNTER — Telehealth: Payer: Self-pay | Admitting: Interventional Cardiology

## 2016-12-07 ENCOUNTER — Telehealth: Payer: Self-pay

## 2016-12-07 DIAGNOSIS — E113511 Type 2 diabetes mellitus with proliferative diabetic retinopathy with macular edema, right eye: Secondary | ICD-10-CM

## 2016-12-07 DIAGNOSIS — H4312 Vitreous hemorrhage, left eye: Secondary | ICD-10-CM | POA: Diagnosis not present

## 2016-12-07 DIAGNOSIS — I10 Essential (primary) hypertension: Secondary | ICD-10-CM | POA: Diagnosis not present

## 2016-12-07 DIAGNOSIS — E11311 Type 2 diabetes mellitus with unspecified diabetic retinopathy with macular edema: Secondary | ICD-10-CM | POA: Diagnosis not present

## 2016-12-07 DIAGNOSIS — H35033 Hypertensive retinopathy, bilateral: Secondary | ICD-10-CM

## 2016-12-07 DIAGNOSIS — H43813 Vitreous degeneration, bilateral: Secondary | ICD-10-CM | POA: Diagnosis not present

## 2016-12-07 NOTE — Telephone Encounter (Signed)
New message    Pt is calling asking for a call back. He is calling about the clearance form he dropped off this morning.

## 2016-12-07 NOTE — Telephone Encounter (Signed)
OK to hold aspirin and Plavix 5 days prior to surgery.  No further cardiac testing needed before surgery.

## 2016-12-07 NOTE — Telephone Encounter (Signed)
Request for surgical clearance:  1. What type of surgery is being performed? Pars Plana Vitrectomy Ophthalmic Surgery under General Anesthesia  2. When is this surgery scheduled? 12/12/16   3. Are there any medications that need to be held prior to surgery and how long? ASA and Plavix: Patient has already been holding since  8/23  4. Name of physician performing surgery? Dr. Tempie Hoist   5. What is your office phone and fax number? Phone- [phone], Fax- [phone]

## 2016-12-07 NOTE — H&P (Signed)
Jeremy Johnston is an 66 y.o. male.   Chief Complaint:severe loss of vision 3 weeks ago left eye HPI: Severe vision loss left eye  Past Medical History:  Diagnosis Date  . Cancer ([place])    kidney cancer-both kidneys remain  . Chronic kidney disease    Robotic Kidney surgery for kidney cancer tumor-kidney remains-  . Coronary artery disease   . Diabetes mellitus (HCC)    Insulin pump -[name], and has a glucose transmitter attached.  . Hyperlipidemia   . Hypertension     Past Surgical History:  Procedure Laterality Date  . CATARACT EXTRACTION, BILATERAL Bilateral    11'15  . CORONARY ARTERY BYPASS GRAFT  10/08   x4 vessels([place])  . DIAGNOSTIC LAPAROSCOPY    . EYE SURGERY     retina peele  . KIDNEY SURGERY     tumor removed  . VASECTOMY      Family History  Problem Relation Age of Onset  . Heart attack Father   . Heart attack Sister   . Diabetes Sister    Social History:  reports that he has never smoked. He has never used smokeless tobacco. He reports that he does not drink alcohol or use drugs.  Allergies: No Known Allergies  No prescriptions prior to admission.    Review of systems otherwise negative  There were no vitals taken for this visit.  Physical exam: Mental status: oriented x3. Eyes: See eye exam associated with this date of surgery in media tab.  Scanned in by scanning center Ears, Nose, Throat: within normal limits Neck: Within Normal limits General: within normal limits Chest: Within normal limits Breast: deferred Heart: Within normal limits Abdomen: Within normal limits GU: deferred Extremities: within normal limits Skin: within normal limits  Assessment/Plan Vitreous hemorrhage, diabetic retinopathy left eye Plan: To [facility] for Pars plana vitrectomy, laser, gas, membrane peel left eye  Jeremy Johnston 12/07/2016, 1:07 PM

## 2016-12-07 NOTE — Telephone Encounter (Signed)
Walk in Pt Form-[facility] Clearance dropped off. Placed in [place] doc box.

## 2016-12-07 NOTE — Telephone Encounter (Signed)
Clearance routed to Dr. Zigmund Daniel at [phone] through [facility].

## 2016-12-07 NOTE — Telephone Encounter (Signed)
Called and made patient aware that the clearance form was received and that the clearance request will be sent to Dr. Irish Lack. Patient states that he has been holding his ASA and Plavix since our last phone call on 8/22. Patinet verbalized understanding and thanked me for the call.

## 2016-12-08 ENCOUNTER — Encounter (HOSPITAL_COMMUNITY): Payer: Self-pay | Admitting: *Deleted

## 2016-12-08 NOTE — Progress Notes (Signed)
Pt denies any acute cardiopulmonary issues. Pt under the care of Dr. Irish Lack, Cardiology. Pt stated that he has stopped taking both Aspirin and Plavix 2 weeks ago. Pt made aware to stop taking vitamins, fish oil and herbal medications. Do not take any NSAIDs ie: Ibuprofen, Advil, Naproxen (Aleve), Motrin, BC and Goody Powder. Pt stated that he will contact his endocrinologist regarding instructions for pump for NPO status. Spoke with Tillie Rung, Diabetic Coordinator, to make her aware to the pt 9:00 AM arrival time for surgery [id number], on 12/12/16 and that pt will be contacting endocrinologist for pump instructions. Pt verbalized understanding of all pre-op instructions. Anesthesia asked to review pt history and clearance note.

## 2016-12-08 NOTE — Progress Notes (Signed)
Pt denies having a chest x ray within the last year.Pt made aware to check BG every 2 hours prior to arrival to hospital on DOS. Pt made aware to treat a BG < 70 with 4 ounces of apple  wait 15 minutes after intervention to recheck BG, if BG remains < 70, call Short Stay unit to speak with a nurse. Pt verbalized understanding.

## 2016-12-11 NOTE — Progress Notes (Signed)
Anesthesia Chart Review:  Pt is a same day work up.   Pt is a 66 year old male scheduled for L pars plana vitrectomy on 12/12/2016 with Tempie Hoist, MD  - PCP is Olin Hauser, MD - Cardiologist is Larae Grooms, MD who is aware of upcoming surgery.   PMH includes:  CAD (s/p CABG 2008), HTN, DM, hyperlipidemia, renal cancer. Never smoker.  Medications include: ASA 325mg , plavix, zetia, pepcid, insulin pump, novolog, quinapril-hctz, rosuvastatin. Pt stopped ASA and plavix over 2 weeks ago.   Labs will be obtained DOS  EKG 10/17/16: NSR. Inferior infarct, age undetermined. T wave abnormality, consider lateral ischemia.   Carotid duplex 11/06/16:  - Heterogeneous plaque, bilaterally. - Stable 1-39% RICA stenosis. - Stable 84-69% LICA stenosis. - Normal subclavian arteries, bilaterally. - Patent vertebral arteries with antegrade flow  Exercise tolerance test 11/06/16:   Blood pressure demonstrated a normal response to exercise.  There was no ST segment deviation noted during stress.  Negative exercise treadmill test for ischemia at adequate workload. The patient achieved 97% MPHR and 11.69mets.  Echo 07/01/12:  1. LV cavity normal in size. Normal global wall motion. Normal systolic global function. EF 59% 2. Trace aortic regurgitation. No aortic stenosis. Mild aortic valve leaflet calcification.  3. Mitral valve structurally normal. Mild mitral regurgitation.  4. Tricuspid valve structurally normal. Trace tricuspid regurgitation.  5. Pulmonic valve structurally normal. Trace pulmonic regurgitation.   If labs acceptable DOS, I anticipate pt can proceed as scheduled.   Willeen Cass, FNP-BC [facility]/Anesthesiology Phone: [phone] 12/11/2016 10:22 AM

## 2016-12-12 ENCOUNTER — Ambulatory Visit (HOSPITAL_COMMUNITY): Payer: Managed Care, Other (non HMO) | Admitting: Emergency Medicine

## 2016-12-12 ENCOUNTER — Encounter (HOSPITAL_COMMUNITY): Payer: Self-pay

## 2016-12-12 ENCOUNTER — Encounter (HOSPITAL_COMMUNITY)
Admission: RE | Disposition: A | Discharge status: Home / Self Care | Payer: Self-pay | Source: Ambulatory Visit | Attending: Ophthalmology

## 2016-12-12 ENCOUNTER — Ambulatory Visit (HOSPITAL_COMMUNITY)
Admission: RE | Admit: 2016-12-12 | Discharge: 2016-12-13 | Disposition: A | Discharge status: Home / Self Care | Payer: Managed Care, Other (non HMO) | Source: Ambulatory Visit | Attending: Ophthalmology | Admitting: Ophthalmology

## 2016-12-12 DIAGNOSIS — E1022 Type 1 diabetes mellitus with diabetic chronic kidney disease: Secondary | ICD-10-CM | POA: Diagnosis not present

## 2016-12-12 DIAGNOSIS — I129 Hypertensive chronic kidney disease with stage 1 through stage 4 chronic kidney disease, or unspecified chronic kidney disease: Secondary | ICD-10-CM | POA: Diagnosis not present

## 2016-12-12 DIAGNOSIS — E103592 Type 1 diabetes mellitus with proliferative diabetic retinopathy without macular edema, left eye: Secondary | ICD-10-CM | POA: Diagnosis not present

## 2016-12-12 DIAGNOSIS — H4311 Vitreous hemorrhage, right eye: Secondary | ICD-10-CM | POA: Diagnosis not present

## 2016-12-12 DIAGNOSIS — Z955 Presence of coronary angioplasty implant and graft: Secondary | ICD-10-CM | POA: Insufficient documentation

## 2016-12-12 DIAGNOSIS — Z85528 Personal history of other malignant neoplasm of kidney: Secondary | ICD-10-CM | POA: Insufficient documentation

## 2016-12-12 DIAGNOSIS — E1051 Type 1 diabetes mellitus with diabetic peripheral angiopathy without gangrene: Secondary | ICD-10-CM | POA: Insufficient documentation

## 2016-12-12 DIAGNOSIS — I083 Combined rheumatic disorders of mitral, aortic and tricuspid valves: Secondary | ICD-10-CM | POA: Diagnosis not present

## 2016-12-12 DIAGNOSIS — E11311 Type 2 diabetes mellitus with unspecified diabetic retinopathy with macular edema: Secondary | ICD-10-CM | POA: Diagnosis not present

## 2016-12-12 DIAGNOSIS — E113512 Type 2 diabetes mellitus with proliferative diabetic retinopathy with macular edema, left eye: Secondary | ICD-10-CM | POA: Diagnosis not present

## 2016-12-12 DIAGNOSIS — Z794 Long term (current) use of insulin: Secondary | ICD-10-CM | POA: Diagnosis not present

## 2016-12-12 DIAGNOSIS — Z951 Presence of aortocoronary bypass graft: Secondary | ICD-10-CM | POA: Insufficient documentation

## 2016-12-12 DIAGNOSIS — I2510 Atherosclerotic heart disease of native coronary artery without angina pectoris: Secondary | ICD-10-CM | POA: Diagnosis not present

## 2016-12-12 DIAGNOSIS — N189 Chronic kidney disease, unspecified: Secondary | ICD-10-CM | POA: Insufficient documentation

## 2016-12-12 DIAGNOSIS — H5462 Unqualified visual loss, left eye, normal vision right eye: Secondary | ICD-10-CM | POA: Diagnosis present

## 2016-12-12 DIAGNOSIS — H4312 Vitreous hemorrhage, left eye: Secondary | ICD-10-CM | POA: Diagnosis not present

## 2016-12-12 HISTORY — DX: Type 2 diabetes mellitus with unspecified diabetic retinopathy without macular edema: E11.319

## 2016-12-12 HISTORY — DX: Vitreous hemorrhage, unspecified eye: H43.10

## 2016-12-12 HISTORY — PX: PARS PLANA VITRECTOMY 27 GAUGE: SHX6738

## 2016-12-12 HISTORY — DX: Sciatica, unspecified side: M54.30

## 2016-12-12 LAB — HEMOGLOBIN A1C
Hgb A1c MFr Bld: 7.2 % — ABNORMAL HIGH (ref 4.8–5.6)
MEAN PLASMA GLUCOSE: 159.94 mg/dL

## 2016-12-12 LAB — CBC
HEMATOCRIT: 44.8 % (ref 39.0–52.0)
Hemoglobin: 15 g/dL (ref 13.0–17.0)
MCH: 29.5 pg (ref 26.0–34.0)
MCHC: 33.5 g/dL (ref 30.0–36.0)
MCV: 88.2 fL (ref 78.0–100.0)
Platelets: 233 10*3/uL (ref 150–400)
RBC: 5.08 MIL/uL (ref 4.22–5.81)
RDW: 13 % (ref 11.5–15.5)
WBC: 7.1 10*3/uL (ref 4.0–10.5)

## 2016-12-12 LAB — GLUCOSE, CAPILLARY
GLUCOSE-CAPILLARY: 149 mg/dL — AB (ref 65–99)
GLUCOSE-CAPILLARY: 130 mg/dL — AB (ref 65–99)
GLUCOSE-CAPILLARY: 113 mg/dL — AB (ref 65–99)
Glucose-Capillary: 82 mg/dL (ref 65–99)
Glucose-Capillary: 71 mg/dL (ref 65–99)
Glucose-Capillary: 140 mg/dL — ABNORMAL HIGH (ref 65–99)
Glucose-Capillary: 106 mg/dL — ABNORMAL HIGH (ref 65–99)
Glucose-Capillary: 260 mg/dL — ABNORMAL HIGH (ref 65–99)

## 2016-12-12 LAB — BASIC METABOLIC PANEL
ANION GAP: 7 (ref 5–15)
BUN: 17 mg/dL (ref 6–20)
CO2: 23 mmol/L (ref 22–32)
Calcium: 9.2 mg/dL (ref 8.9–10.3)
Chloride: 108 mmol/L (ref 101–111)
Creatinine, Ser: 0.92 mg/dL (ref 0.61–1.24)
GFR calc non Af Amer: >60 mL/min (ref >60)
GLUCOSE: 152 mg/dL — AB (ref 65–99)
POTASSIUM: 4.6 mmol/L (ref 3.5–5.1)
Sodium: 138 mmol/L (ref 135–145)

## 2016-12-12 SURGERY — PARS PLANA VITRECTOMY 27 GAUGE
Anesthesia: General | Site: Eye | Laterality: Left

## 2016-12-12 MED ORDER — ONDANSETRON HCL 4 MG/2ML IJ SOLN
INTRAMUSCULAR | Status: AC
  Filled 2016-12-12: qty 2

## 2016-12-12 MED ORDER — SUGAMMADEX SODIUM 200 MG/2ML IV SOLN
INTRAVENOUS | Status: AC
  Filled 2016-12-12: qty 2

## 2016-12-12 MED ORDER — TETRACAINE HCL 0.5 % OP SOLN
2.0000 [drp] | Freq: Once | OPHTHALMIC | Status: DC
  Filled 2016-12-12: qty 4

## 2016-12-12 MED ORDER — ONDANSETRON HCL 4 MG/2ML IJ SOLN
INTRAMUSCULAR | Status: DC | PRN
  Administered 2016-12-12: 4 mg via INTRAVENOUS

## 2016-12-12 MED ORDER — MORPHINE SULFATE (PF) 4 MG/ML IV SOLN: 1.0000 mg | INTRAVENOUS | Status: DC | PRN

## 2016-12-12 MED ORDER — LIDOCAINE HCL 2 % IJ SOLN
INTRAMUSCULAR | Status: AC
  Filled 2016-12-12: qty 20

## 2016-12-12 MED ORDER — DORZOLAMIDE HCL 2 % OP SOLN
1.0000 [drp] | Freq: Three times a day (TID) | OPHTHALMIC | Status: DC
  Filled 2016-12-12: qty 10

## 2016-12-12 MED ORDER — TEMAZEPAM 15 MG PO CAPS: 15.0000 mg | ORAL_CAPSULE | Freq: Every evening | ORAL | Status: DC | PRN

## 2016-12-12 MED ORDER — PREDNISOLONE ACETATE 1 % OP SUSP
1.0000 [drp] | Freq: Four times a day (QID) | OPHTHALMIC | Status: DC
  Filled 2016-12-12: qty 5

## 2016-12-12 MED ORDER — DEXAMETHASONE SODIUM PHOSPHATE 10 MG/ML IJ SOLN
INTRAMUSCULAR | Status: AC
  Filled 2016-12-12: qty 1

## 2016-12-12 MED ORDER — SUGAMMADEX SODIUM 200 MG/2ML IV SOLN
INTRAVENOUS | Status: DC | PRN
  Administered 2016-12-12: 200 mg via INTRAVENOUS

## 2016-12-12 MED ORDER — MIDAZOLAM HCL 2 MG/2ML IJ SOLN
INTRAMUSCULAR | Status: AC
  Filled 2016-12-12: qty 2

## 2016-12-12 MED ORDER — PHENYLEPHRINE HCL 10 MG/ML IJ SOLN
INTRAMUSCULAR | Status: DC | PRN
  Administered 2016-12-12: 30 ug/min via INTRAVENOUS

## 2016-12-12 MED ORDER — INSULIN PUMP
Freq: Three times a day (TID) | SUBCUTANEOUS | Status: DC
  Administered 2016-12-12: 6.18 via SUBCUTANEOUS
  Filled 2016-12-12: qty 1

## 2016-12-12 MED ORDER — PHENYLEPHRINE 40 MCG/ML (10ML) SYRINGE FOR IV PUSH (FOR BLOOD PRESSURE SUPPORT)
PREFILLED_SYRINGE | INTRAVENOUS | Status: AC
  Filled 2016-12-12: qty 10

## 2016-12-12 MED ORDER — SODIUM HYALURONATE 10 MG/ML IO SOLN
INTRAOCULAR | Status: DC | PRN
  Administered 2016-12-12: 0.85 mL via INTRAOCULAR

## 2016-12-12 MED ORDER — STERILE WATER FOR INJECTION IJ SOLN
INTRAMUSCULAR | Status: AC
  Filled 2016-12-12: qty 20

## 2016-12-12 MED ORDER — STERILE WATER FOR INJECTION IJ SOLN
INTRAMUSCULAR | Status: DC | PRN
  Administered 2016-12-12: 20 mL

## 2016-12-12 MED ORDER — CYCLOPENTOLATE HCL 1 % OP SOLN
1.0000 [drp] | OPHTHALMIC | Status: AC | PRN
  Administered 2016-12-12 (×3): 1 [drp] via OPHTHALMIC
  Filled 2016-12-12: qty 2

## 2016-12-12 MED ORDER — SODIUM CHLORIDE 0.9 % IJ SOLN
INTRAMUSCULAR | Status: AC
  Filled 2016-12-12: qty 10

## 2016-12-12 MED ORDER — BSS PLUS IO SOLN
INTRAOCULAR | Status: DC | PRN
  Administered 2016-12-12: 1 via INTRAOCULAR

## 2016-12-12 MED ORDER — HYALURONIDASE HUMAN 150 UNIT/ML IJ SOLN
INTRAMUSCULAR | Status: AC
  Filled 2016-12-12: qty 1

## 2016-12-12 MED ORDER — LORATADINE 10 MG PO TABS: 10.0000 mg | ORAL_TABLET | Freq: Every day | ORAL | Status: DC | PRN

## 2016-12-12 MED ORDER — BACITRACIN-POLYMYXIN B 500-10000 UNIT/GM OP OINT
TOPICAL_OINTMENT | OPHTHALMIC | Status: AC
  Filled 2016-12-12: qty 3.5

## 2016-12-12 MED ORDER — FAMOTIDINE 20 MG PO TABS
20.0000 mg | ORAL_TABLET | Freq: Every day | ORAL | Status: DC | PRN
  Administered 2016-12-12: 20 mg via ORAL
  Filled 2016-12-12: qty 1

## 2016-12-12 MED ORDER — PROPOFOL 10 MG/ML IV BOLUS
INTRAVENOUS | Status: AC
  Filled 2016-12-12: qty 20

## 2016-12-12 MED ORDER — POLYMYXIN B SULFATE 500000 UNITS IJ SOLR
INTRAMUSCULAR | Status: AC
  Filled 2016-12-12: qty 500000

## 2016-12-12 MED ORDER — INSULIN ASPART 100 UNIT/ML ~~LOC~~ SOLN: 0.0000 [IU] | SUBCUTANEOUS | Status: DC

## 2016-12-12 MED ORDER — TRIAMCINOLONE ACETONIDE 40 MG/ML IJ SUSP
INTRAMUSCULAR | Status: DC | PRN
  Administered 2016-12-12: 40 mg

## 2016-12-12 MED ORDER — BRIMONIDINE TARTRATE 0.15 % OP SOLN
1.0000 [drp] | Freq: Two times a day (BID) | OPHTHALMIC | Status: DC
  Administered 2016-12-12: 1 [drp] via OPHTHALMIC
  Filled 2016-12-12: qty 5

## 2016-12-12 MED ORDER — HYDROCODONE-ACETAMINOPHEN 5-325 MG PO TABS: 1.0000 | ORAL_TABLET | ORAL | Status: DC | PRN

## 2016-12-12 MED ORDER — LISINOPRIL 10 MG PO TABS: 10.0000 mg | ORAL_TABLET | Freq: Every day | ORAL | Status: DC

## 2016-12-12 MED ORDER — BRIMONIDINE TARTRATE 0.15 % OP SOLN
1.0000 [drp] | Freq: Two times a day (BID) | OPHTHALMIC | Status: DC
  Filled 2016-12-12: qty 5

## 2016-12-12 MED ORDER — GATIFLOXACIN 0.5 % OP SOLN
1.0000 [drp] | OPHTHALMIC | Status: AC | PRN
  Administered 2016-12-12 (×3): 1 [drp] via OPHTHALMIC
  Filled 2016-12-12: qty 2.5

## 2016-12-12 MED ORDER — BACITRACIN-POLYMYXIN B 500-10000 UNIT/GM OP OINT
1.0000 "application " | TOPICAL_OINTMENT | Freq: Three times a day (TID) | OPHTHALMIC | Status: DC
  Filled 2016-12-12: qty 3.5

## 2016-12-12 MED ORDER — CEFTAZIDIME 1 G IJ SOLR
INTRAMUSCULAR | Status: AC
  Filled 2016-12-12: qty 1

## 2016-12-12 MED ORDER — QUINAPRIL-HYDROCHLOROTHIAZIDE 20-12.5 MG PO TABS: 1.0000 | ORAL_TABLET | Freq: Every day | ORAL | Status: DC

## 2016-12-12 MED ORDER — INSULIN ASPART 100 UNIT/ML ~~LOC~~ SOLN: 1.0000 [IU] | SUBCUTANEOUS | Status: DC

## 2016-12-12 MED ORDER — FENTANYL CITRATE (PF) 100 MCG/2ML IJ SOLN
INTRAMUSCULAR | Status: DC | PRN
  Administered 2016-12-12: 50 ug via INTRAVENOUS

## 2016-12-12 MED ORDER — MAGNESIUM HYDROXIDE 400 MG/5ML PO SUSP: 15.0000 mL | Freq: Four times a day (QID) | ORAL | Status: DC | PRN

## 2016-12-12 MED ORDER — SODIUM HYALURONATE 10 MG/ML IO SOLN
INTRAOCULAR | Status: AC
  Filled 2016-12-12: qty 0.85

## 2016-12-12 MED ORDER — PROPOFOL 10 MG/ML IV BOLUS
INTRAVENOUS | Status: DC | PRN
  Administered 2016-12-12: 40 mg via INTRAVENOUS
  Administered 2016-12-12: 120 mg via INTRAVENOUS
  Administered 2016-12-12: 40 mg via INTRAVENOUS

## 2016-12-12 MED ORDER — TROPICAMIDE 1 % OP SOLN
1.0000 [drp] | OPHTHALMIC | Status: AC | PRN
  Administered 2016-12-12 (×3): 1 [drp] via OPHTHALMIC
  Filled 2016-12-12: qty 15

## 2016-12-12 MED ORDER — HYDROCHLOROTHIAZIDE 10 MG/ML ORAL SUSPENSION
6.2500 mg | Freq: Every day | ORAL | Status: DC
  Filled 2016-12-12 (×2): qty 1.25

## 2016-12-12 MED ORDER — ACETAZOLAMIDE SODIUM 500 MG IJ SOLR
500.0000 mg | Freq: Once | INTRAMUSCULAR | Status: AC
  Administered 2016-12-13: 500 mg via INTRAVENOUS
  Filled 2016-12-12: qty 500

## 2016-12-12 MED ORDER — ATROPINE SULFATE 1 % OP SOLN
OPHTHALMIC | Status: AC
  Filled 2016-12-12: qty 5

## 2016-12-12 MED ORDER — TRIAMCINOLONE ACETONIDE 40 MG/ML IJ SUSP
INTRAMUSCULAR | Status: AC
  Filled 2016-12-12: qty 5

## 2016-12-12 MED ORDER — CEFAZOLIN SODIUM-DEXTROSE 2-4 GM/100ML-% IV SOLN
2.0000 g | INTRAVENOUS | Status: AC
  Administered 2016-12-12: 2 g via INTRAVENOUS
  Filled 2016-12-12: qty 100

## 2016-12-12 MED ORDER — SODIUM CHLORIDE 0.45 % IV SOLN
INTRAVENOUS | Status: DC
  Administered 2016-12-12: 22:00:00 via INTRAVENOUS

## 2016-12-12 MED ORDER — BUPIVACAINE-EPINEPHRINE (PF) 0.25% -1:200000 IJ SOLN
INTRAMUSCULAR | Status: AC
  Filled 2016-12-12: qty 30

## 2016-12-12 MED ORDER — DEXTROSE 50 % IV SOLN
INTRAVENOUS | Status: AC
  Filled 2016-12-12: qty 50

## 2016-12-12 MED ORDER — BSS IO SOLN
INTRAOCULAR | Status: DC | PRN
  Administered 2016-12-12: 500 mL via INTRAOCULAR

## 2016-12-12 MED ORDER — EZETIMIBE 10 MG PO TABS
10.0000 mg | ORAL_TABLET | Freq: Every day | ORAL | Status: DC
  Administered 2016-12-12: 10 mg via ORAL
  Filled 2016-12-12: qty 1

## 2016-12-12 MED ORDER — BRIMONIDINE TARTRATE 0.2 % OP SOLN: 1.0000 [drp] | Freq: Two times a day (BID) | OPHTHALMIC | Status: DC

## 2016-12-12 MED ORDER — SODIUM CHLORIDE 0.9 % IV SOLN
INTRAVENOUS | Status: DC | PRN
  Administered 2016-12-12 (×2): via INTRAVENOUS

## 2016-12-12 MED ORDER — 0.9 % SODIUM CHLORIDE (POUR BTL) OPTIME
TOPICAL | Status: DC | PRN
  Administered 2016-12-12: 1000 mL

## 2016-12-12 MED ORDER — PHENYLEPHRINE HCL 10 MG/ML IJ SOLN
INTRAMUSCULAR | Status: DC | PRN
  Administered 2016-12-12: 80 ug via INTRAVENOUS
  Administered 2016-12-12: 120 ug via INTRAVENOUS
  Administered 2016-12-12 (×2): 80 ug via INTRAVENOUS
  Administered 2016-12-12: 40 ug via INTRAVENOUS

## 2016-12-12 MED ORDER — ACETAMINOPHEN 325 MG PO TABS
325.0000 mg | ORAL_TABLET | ORAL | Status: DC | PRN
  Administered 2016-12-12: 650 mg via ORAL
  Filled 2016-12-12: qty 2

## 2016-12-12 MED ORDER — BSS PLUS IO SOLN
INTRAOCULAR | Status: AC
  Filled 2016-12-12: qty 500

## 2016-12-12 MED ORDER — HYPROMELLOSE (GONIOSCOPIC) 2.5 % OP SOLN
OPHTHALMIC | Status: AC
  Filled 2016-12-12: qty 15

## 2016-12-12 MED ORDER — BACITRACIN-POLYMYXIN B 500-10000 UNIT/GM OP OINT
TOPICAL_OINTMENT | OPHTHALMIC | Status: DC | PRN
  Administered 2016-12-12: 1 via OPHTHALMIC

## 2016-12-12 MED ORDER — FENTANYL CITRATE (PF) 250 MCG/5ML IJ SOLN
INTRAMUSCULAR | Status: AC
  Filled 2016-12-12: qty 5

## 2016-12-12 MED ORDER — BUPIVACAINE HCL (PF) 0.75 % IJ SOLN
INTRAMUSCULAR | Status: AC
  Filled 2016-12-12: qty 10

## 2016-12-12 MED ORDER — PHENYLEPHRINE HCL 2.5 % OP SOLN
1.0000 [drp] | OPHTHALMIC | Status: AC | PRN
  Administered 2016-12-12 (×3): 1 [drp] via OPHTHALMIC
  Filled 2016-12-12: qty 2

## 2016-12-12 MED ORDER — DEXTROSE 50 % IV SOLN
INTRAVENOUS | Status: DC | PRN
  Administered 2016-12-12: 12.5 mL via INTRAVENOUS

## 2016-12-12 MED ORDER — MIDAZOLAM HCL 5 MG/5ML IJ SOLN
INTRAMUSCULAR | Status: DC | PRN
  Administered 2016-12-12: 2 mg via INTRAVENOUS

## 2016-12-12 MED ORDER — LATANOPROST 0.005 % OP SOLN
1.0000 [drp] | Freq: Every day | OPHTHALMIC | Status: DC
  Filled 2016-12-12: qty 2.5

## 2016-12-12 MED ORDER — LIDOCAINE HCL (CARDIAC) 20 MG/ML IV SOLN
INTRAVENOUS | Status: DC | PRN
  Administered 2016-12-12: 70 mg via INTRAVENOUS

## 2016-12-12 MED ORDER — BSS IO SOLN
INTRAOCULAR | Status: AC
  Filled 2016-12-12: qty 15

## 2016-12-12 MED ORDER — BUPIVACAINE HCL (PF) 0.75 % IJ SOLN
INTRAMUSCULAR | Status: DC | PRN
  Administered 2016-12-12: 10 mL

## 2016-12-12 MED ORDER — EPINEPHRINE PF 1 MG/ML IJ SOLN
INTRAMUSCULAR | Status: DC | PRN
  Administered 2016-12-12: .3 mL

## 2016-12-12 MED ORDER — EPINEPHRINE PF 1 MG/ML IJ SOLN
INTRAMUSCULAR | Status: AC
  Filled 2016-12-12: qty 1

## 2016-12-12 MED ORDER — DORZOLAMIDE HCL-TIMOLOL MAL 2-0.5 % OP SOLN
1.0000 [drp] | Freq: Two times a day (BID) | OPHTHALMIC | Status: DC
  Filled 2016-12-12: qty 10

## 2016-12-12 MED ORDER — ROCURONIUM BROMIDE 100 MG/10ML IV SOLN
INTRAVENOUS | Status: DC | PRN
  Administered 2016-12-12: 40 mg via INTRAVENOUS

## 2016-12-12 MED ORDER — ONDANSETRON HCL 4 MG/2ML IJ SOLN: 4.0000 mg | Freq: Four times a day (QID) | INTRAMUSCULAR | Status: DC

## 2016-12-12 MED ORDER — ACETAZOLAMIDE SODIUM 500 MG IJ SOLR
INTRAMUSCULAR | Status: AC
  Filled 2016-12-12: qty 500

## 2016-12-12 MED ORDER — GATIFLOXACIN 0.5 % OP SOLN
1.0000 [drp] | Freq: Four times a day (QID) | OPHTHALMIC | Status: DC
  Filled 2016-12-12: qty 2.5

## 2016-12-12 MED ORDER — DEXAMETHASONE SODIUM PHOSPHATE 10 MG/ML IJ SOLN
INTRAMUSCULAR | Status: DC | PRN
  Administered 2016-12-12: 10 mg

## 2016-12-12 MED ORDER — LIDOCAINE 2% (20 MG/ML) 5 ML SYRINGE
INTRAMUSCULAR | Status: AC
  Filled 2016-12-12: qty 5

## 2016-12-12 SURGICAL SUPPLY — 66 items
APPLICATOR DR MATTHEWS STRL (MISCELLANEOUS) IMPLANT
BLADE EYE CATARACT 19 1.4 BEAV (BLADE) IMPLANT
BLADE MVR KNIFE 19G (BLADE) IMPLANT
BLADE MVR KNIFE 20G (BLADE) IMPLANT
CANNULA ANTERIOR CHAMBER 27GA (MISCELLANEOUS) IMPLANT
CANNULA DUAL BORE 23G (CANNULA) IMPLANT
CANNULA TROCAR 25GA VLV (OPHTHALMIC) IMPLANT
CANNULA VLV SOFT TIP 27GA (OPHTHALMIC) ×3 IMPLANT
CORDS BIPOLAR (ELECTRODE) IMPLANT
COTTONBALL LRG STERILE PKG (GAUZE/BANDAGES/DRESSINGS) ×9 IMPLANT
COVER MAYO STAND STRL (DRAPES) IMPLANT
DRAPE INCISE 51X51 W/FILM STRL (DRAPES) IMPLANT
DRAPE OPHTHALMIC 77X100 STRL (CUSTOM PROCEDURE TRAY) ×3 IMPLANT
FILTER BLUE MILLIPORE (MISCELLANEOUS) IMPLANT
FILTER STRAW FLUID ASPIR (MISCELLANEOUS) IMPLANT
FORCEPS ECKARDT ILM 25G SERR (OPHTHALMIC RELATED) IMPLANT
FORCEPS GRIESHABER ILM 27G (INSTRUMENTS) IMPLANT
GLOVE SS BIOGEL STRL SZ 6.5 (GLOVE) ×1 IMPLANT
GLOVE SS BIOGEL STRL SZ 7 (GLOVE) ×1 IMPLANT
GLOVE SUPERSENSE BIOGEL SZ 6.5 (GLOVE) ×2
GLOVE SUPERSENSE BIOGEL SZ 7 (GLOVE) ×2
GLOVE SURG 8.5 LATEX PF (GLOVE) ×3 IMPLANT
GOWN STRL REUS W/ TWL LRG LVL3 (GOWN DISPOSABLE) ×3 IMPLANT
GOWN STRL REUS W/TWL LRG LVL3 (GOWN DISPOSABLE) ×6
HANDLE PNEUMATIC FOR CONSTEL (OPHTHALMIC) IMPLANT
KIT BASIN OR (CUSTOM PROCEDURE TRAY) ×3 IMPLANT
KNIFE CRESCENT 2.5 55 ANG (BLADE) IMPLANT
MICROPICK 25G (MISCELLANEOUS)
NEEDLE 18GX1X1/2 (RX/OR ONLY) (NEEDLE) ×3 IMPLANT
NEEDLE 25GX 5/8IN NON SAFETY (NEEDLE) IMPLANT
NEEDLE FILTER BLUNT 18X 1/2SAF (NEEDLE) ×2
NEEDLE FILTER BLUNT 18X1 1/2 (NEEDLE) ×1 IMPLANT
NEEDLE HYPO 30X.5 LL (NEEDLE) IMPLANT
NS IRRIG 1000ML POUR BTL (IV SOLUTION) ×3 IMPLANT
PACK VITRECTOMY CUSTOM (CUSTOM PROCEDURE TRAY) ×3 IMPLANT
PAD ARMBOARD 7.5X6 YLW CONV (MISCELLANEOUS) ×6 IMPLANT
PAK VITRECTOMY PIK  27GA (OPHTHALMIC) ×2
PAK VITRECTOMY PIK 27GA (OPHTHALMIC) ×1 IMPLANT
PENCIL BIPOLAR 25GA STR DISP (OPHTHALMIC RELATED) IMPLANT
PIC ILLUMINATED 25G (OPHTHALMIC) ×3
PICK MICROPICK 25G (MISCELLANEOUS) IMPLANT
PIK ILLUMINATED 25G (OPHTHALMIC) ×1 IMPLANT
PROBE LASER ILLUM FLEX 27GA (OPHTHALMIC) ×3 IMPLANT
PROBE LASER ILLUM FLEX CVD 25G (OPHTHALMIC) IMPLANT
REPL STRA BRUSH NEEDLE (NEEDLE) IMPLANT
RESERVOIR BACK FLUSH (MISCELLANEOUS) IMPLANT
ROLLS DENTAL (MISCELLANEOUS) ×6 IMPLANT
SCISSORS TIP ADVANCED DSP 25GA (INSTRUMENTS) IMPLANT
SCRAPER DIAMOND 25GA (OPHTHALMIC RELATED) IMPLANT
SCRAPER DIAMOND DUST MEMBRANE (MISCELLANEOUS) IMPLANT
SPONGE SURGIFOAM ABS GEL 12-7 (HEMOSTASIS) ×3 IMPLANT
STOPCOCK 4 WAY LG BORE MALE ST (IV SETS) IMPLANT
SUT CHROMIC 7 0 TG140 8 (SUTURE) IMPLANT
SUT ETHILON 10 0 CS140 6 (SUTURE) IMPLANT
SUT ETHILON 9 0 TG140 8 (SUTURE) IMPLANT
SUT POLY NON ABSORB 10-0 8 STR (SUTURE) IMPLANT
SUT SILK 4 0 RB 1 (SUTURE) IMPLANT
SYR 10ML LL (SYRINGE) IMPLANT
SYR 20CC LL (SYRINGE) ×3 IMPLANT
SYR 5ML LL (SYRINGE) IMPLANT
SYR BULB 3OZ (MISCELLANEOUS) ×3 IMPLANT
SYR TB 1ML LUER SLIP (SYRINGE) ×3 IMPLANT
TOWEL OR 17X24 6PK STRL BLUE (TOWEL DISPOSABLE) ×3 IMPLANT
TUBING HIGH PRESS EXTEN 6IN (TUBING) IMPLANT
WATER STERILE IRR 1000ML POUR (IV SOLUTION) ×3 IMPLANT
WIPE INSTRUMENT VISIWIPE 73X73 (MISCELLANEOUS) IMPLANT

## 2016-12-12 NOTE — Progress Notes (Signed)
Patient has an insulin pump. He has decreased his basal rate to 80% as instructed by his endocrinologist. The diabetes coordinator was called and she said there was no need to turn the pump off for surgeries less than 2 hours. She is aware the patient is here and does not need to consult.

## 2016-12-12 NOTE — Anesthesia Postprocedure Evaluation (Signed)
Anesthesia Post Note  Patient: Jeremy Johnston  Procedure(s) Performed: Procedure(s) (LRB): PARS PLANA VITRECTOMY 27 GAUGE , ENDOLASER GAS/ FLUID EXCHAGNE (Left)     Patient location during evaluation: PACU Anesthesia Type: General Level of consciousness: awake and alert Pain management: pain level controlled Vital Signs Assessment: post-procedure vital signs reviewed and stable Respiratory status: spontaneous breathing, nonlabored ventilation, respiratory function stable and patient connected to nasal cannula oxygen Cardiovascular status: blood pressure returned to baseline and stable Postop Assessment: no signs of nausea or vomiting Anesthetic complications: no    Last Vitals:  Vitals:   12/12/16 1645 12/12/16 1715  BP: (!) 151/70 (!) 151/68  Pulse: 68 66  Resp: 18 16  Temp:    SpO2: 95% 94%    Last Pain:  Vitals:   12/12/16 0924  TempSrc: Oral                 Elynn Patteson,JAMES TERRILL

## 2016-12-12 NOTE — H&P (Signed)
I examined the patient today and there is no change in the medical status 

## 2016-12-12 NOTE — Progress Notes (Signed)
Patient arrived to PACU with insulin pump turned off.  CBG: 106.  Patient states with this blood sugar level he would resume his basal rate on the pump.  Spoke with Diabetes Coordinator who agrees with this plan.  Insulin Pump order set is in per Dr. Zigmund Daniel.

## 2016-12-12 NOTE — Anesthesia Preprocedure Evaluation (Addendum)
Anesthesia Evaluation  Patient identified by MRN, date of birth, ID band Patient awake    Reviewed: Allergy & Precautions, NPO status , Patient's Chart, lab work & pertinent test results  History of Anesthesia Complications Negative for: history of anesthetic complications  Airway Mallampati: II  TM Distance: >3 FB Neck ROM: Full    Dental  (+) Teeth Intact, Dental Advisory Given   Pulmonary neg pulmonary ROS,    breath sounds clear to auscultation       Cardiovascular hypertension, Pt. on medications + CAD, + Cardiac Stents and + Peripheral Vascular Disease   Rhythm:Regular Rate:Normal     Neuro/Psych    GI/Hepatic negative GI ROS, Neg liver ROS,   Endo/Other  diabetes, Well Controlled, Type 1, Insulin DependentInsulin pump  Renal/GU      Musculoskeletal   Abdominal   Peds  Hematology negative hematology ROS (+)   Anesthesia Other Findings   Reproductive/Obstetrics                            Anesthesia Physical Anesthesia Plan  ASA: III  Anesthesia Plan: General   Post-op Pain Management:    Induction: Intravenous  PONV Risk Score and Plan: 2 and Ondansetron and Dexamethasone  Airway Management Planned: Oral ETT  Additional Equipment:   Intra-op Plan:   Post-operative Plan: Extubation in OR  Informed Consent: I have reviewed the patients History and Physical, chart, labs and discussed the procedure including the risks, benefits and alternatives for the proposed anesthesia with the patient or authorized representative who has indicated his/her understanding and acceptance.   Dental advisory given  Plan Discussed with: CRNA  Anesthesia Plan Comments:         Anesthesia Quick Evaluation

## 2016-12-12 NOTE — Brief Op Note (Signed)
Brief Operative note   Preoperative diagnosis:  vitreous hemorrhage left eye Postoperative diagnosis  Vitreous hemorrhage left eye  Procedures: Pars plana vitrectomy, laser, gas injection left eye  Surgeon:  Hayden Pedro, MD...  Assistant:  Deatra Ina SA   Anesthesia: General  Specimen: none  Estimated blood loss:  1cc  Complications: none  Patient sent to PACU in good condition  Composed by Hayden Pedro MD  Dictation number: [phone] [phone]

## 2016-12-12 NOTE — Transfer of Care (Signed)
Immediate Anesthesia Transfer of Care Note  Patient: Jeremy Johnston  Procedure(s) Performed: Procedure(s): PARS PLANA VITRECTOMY 27 GAUGE , ENDOLASER GAS/ FLUID EXCHAGNE (Left)  Patient Location: PACU  Anesthesia Type:General  Level of Consciousness: awake, oriented and patient cooperative  Airway & Oxygen Therapy: Patient Spontanous Breathing and Patient connected to nasal cannula oxygen  Post-op Assessment: Report given to RN and Post -op Vital signs reviewed and stable  Post vital signs: Reviewed  Last Vitals:  Vitals:   12/12/16 0926 12/12/16 1545  BP: (!) 170/76 (!) 122/56  Pulse:  72  Resp:  19  Temp:  36.5 C  SpO2:  97%    Last Pain:  Vitals:   12/12/16 0924  TempSrc: Oral      Patients Stated Pain Goal: 4 (00/76/22 6333)  Complications: No apparent anesthesia complications

## 2016-12-12 NOTE — Anesthesia Procedure Notes (Signed)
Procedure Name: Intubation Date/Time: 12/12/2016 2:49 PM Performed by: Jenne Campus Pre-anesthesia Checklist: Patient identified, Emergency Drugs available, Suction available and Patient being monitored Patient Re-evaluated:Patient Re-evaluated prior to induction Oxygen Delivery Method: Circle System Utilized Preoxygenation: Pre-oxygenation with 100% oxygen Induction Type: IV induction Ventilation: Mask ventilation without difficulty Laryngoscope Size: Miller and 2 Grade View: Grade III Tube type: Oral Tube size: 7.5 mm Number of attempts: 1 Airway Equipment and Method: Stylet and Oral airway Placement Confirmation: ETT inserted through vocal cords under direct vision,  positive ETCO2 and breath sounds checked- equal and bilateral Secured at: 22 cm Tube secured with: Tape Dental Injury: Teeth and Oropharynx as per pre-operative assessment  Comments: DL x 1 Mil 2 by [employer] CRNA. Grade 3 view. Unable to pass ETT. ETT passed by Dr Orene Desanctis. +ETCO2 bbse

## 2016-12-13 ENCOUNTER — Encounter (HOSPITAL_COMMUNITY): Payer: Self-pay | Admitting: Ophthalmology

## 2016-12-13 DIAGNOSIS — H4312 Vitreous hemorrhage, left eye: Secondary | ICD-10-CM | POA: Diagnosis not present

## 2016-12-13 LAB — GLUCOSE, CAPILLARY: GLUCOSE-CAPILLARY: 216 mg/dL — AB (ref 65–99)

## 2016-12-13 MED ORDER — DORZOLAMIDE HCL-TIMOLOL MAL 2-0.5 % OP SOLN: 1.0000 [drp] | Freq: Two times a day (BID) | OPHTHALMIC | 12 refills | Status: DC

## 2016-12-13 MED ORDER — PREDNISOLONE ACETATE 1 % OP SUSP: 1.0000 [drp] | Freq: Four times a day (QID) | OPHTHALMIC | 0 refills | Status: DC

## 2016-12-13 MED ORDER — BRIMONIDINE TARTRATE 0.2 % OP SOLN: 1.0000 [drp] | Freq: Two times a day (BID) | OPHTHALMIC | 12 refills | Status: DC

## 2016-12-13 MED ORDER — GATIFLOXACIN 0.5 % OP SOLN: 1.0000 [drp] | Freq: Four times a day (QID) | OPHTHALMIC | Status: DC

## 2016-12-13 NOTE — Discharge Summary (Signed)
Discharge summary not needed on [facility] patients per medical records. 

## 2016-12-13 NOTE — Op Note (Signed)
NAME:  KIEFFER, BLATZ NO.:  MEDICAL RECORD NO.:  [id number]  LOCATION:                                 FACILITY:  PHYSICIAN:  Diane Hanel D. Zigmund Daniel, M.D.      DATE OF BIRTH:  DATE OF PROCEDURE:  12/12/2016 DATE OF DISCHARGE:                              OPERATIVE REPORT   ADMISSION DIAGNOSES: 1. Vitreous hemorrhage, left eye. 2. Proliferative diabetic retinopathy, left eye.  PROCEDURES:  Pars plana vitrectomy, left eye.  Retinal photocoagulation, left eye.  Gas-fluid exchange, left eye.  SURGEON:  Chrystie Nose. Zigmund Daniel, M.D.  ASSISTANT:  Deatra Ina, SA.  ANESTHESIA:  General.  DETAILS:  Usual prep and drape, 27-gauge trocars placed at 10, 2, and 4 o'clock; infusion at 4 o'clock; Provisc placed on the corneal surface; and the BIOM viewing system was moved into place.  Pars plana vitrectomy was begun just behind the pseudophakia.  Dense red blood was encountered.  This was carefully removed under low suction and rapid cutting.  Vitreous gel was insnared with light red blood.  The vitrectomy was carried down to the macular surface where the silicone tip suction line was used to vacuum the blood from the surface of the macula.  The vitrectomy was carried into the mid periphery where surface proliferation was carefully removed under low suction and rapid cutting. The vitrectomy was carried into the far periphery down to the vitreous base.  Scleral depression was used to gain access to this area and the super wide viewing system, a BIOM viewing lens was used.  Blood and vitreous was trimmed from the pars plana for 360 degrees.  The endolaser was positioned in the eye, 1522 burns were placed around the retinal periphery with a power of 300 mW, 1000 microns each, 0.1 seconds each. Additional vitrectomy was carried out until the entire vitreous cavity was clean with no blood remaining.  A 30% gas-fluid exchange was carried out.  The instruments were removed from  the eye.  The 27 trocars were removed from the eye.  The wounds were tested and found to be secure. Polymyxin and ceftazidime were rinsed around the globe for antibiotic coverage.  Decadron 10 mg was injected into the lower subconjunctival space.  Marcaine was injected around the globe for postop pain. Polysporin ophthalmic ointment, a patch, and shield were placed.  The patient was awakened and taken to recovery in satisfactory condition. Closing pressure 10 with a [place] tonometer.  COMPLICATIONS:  None.  DURATION:  One hour.     Chrystie Nose. Zigmund Daniel, M.D.     JDM/MEDQ  D:  12/12/2016  T:  12/12/2016  Job:  [id number]

## 2016-12-13 NOTE — Progress Notes (Signed)
12/13/2016, 6:37 AM  Mental Status:  Awake, Alert, Oriented  Anterior segment: Cornea  Clear    Anterior Chamber Clear    Lens:    IOL  Intra Ocular Pressure 18 mmHg with Tonopen  Vitreous: Clear 40%gas bubble   Retina:  Attached Good laser reaction  Impression: Excellent result Retina attached   Final Diagnosis: Active Problems:   Vitreous hemorrhage, left eye ([place])   Plan: start post operative eye drops.  Discharge to home.  Give post operative instructions  Jeremy Johnston 12/13/2016, 6:37 AM

## 2016-12-13 NOTE — Progress Notes (Signed)
Patient discharged to home  in stable condition.Discharge instruction given.All questions answered.

## 2016-12-19 ENCOUNTER — Encounter (INDEPENDENT_AMBULATORY_CARE_PROVIDER_SITE_OTHER): Payer: Managed Care, Other (non HMO) | Admitting: Ophthalmology

## 2016-12-19 DIAGNOSIS — E113592 Type 2 diabetes mellitus with proliferative diabetic retinopathy without macular edema, left eye: Secondary | ICD-10-CM

## 2016-12-19 DIAGNOSIS — E11319 Type 2 diabetes mellitus with unspecified diabetic retinopathy without macular edema: Secondary | ICD-10-CM

## 2016-12-22 ENCOUNTER — Encounter (INDEPENDENT_AMBULATORY_CARE_PROVIDER_SITE_OTHER): Payer: Managed Care, Other (non HMO) | Admitting: Ophthalmology

## 2017-01-08 ENCOUNTER — Encounter (INDEPENDENT_AMBULATORY_CARE_PROVIDER_SITE_OTHER): Payer: Managed Care, Other (non HMO) | Admitting: Ophthalmology

## 2017-01-08 DIAGNOSIS — E113512 Type 2 diabetes mellitus with proliferative diabetic retinopathy with macular edema, left eye: Secondary | ICD-10-CM

## 2017-01-08 DIAGNOSIS — E11311 Type 2 diabetes mellitus with unspecified diabetic retinopathy with macular edema: Secondary | ICD-10-CM

## 2017-01-18 ENCOUNTER — Encounter (INDEPENDENT_AMBULATORY_CARE_PROVIDER_SITE_OTHER): Payer: Managed Care, Other (non HMO) | Admitting: Ophthalmology

## 2017-01-18 DIAGNOSIS — H43813 Vitreous degeneration, bilateral: Secondary | ICD-10-CM

## 2017-01-18 DIAGNOSIS — E113513 Type 2 diabetes mellitus with proliferative diabetic retinopathy with macular edema, bilateral: Secondary | ICD-10-CM

## 2017-01-18 DIAGNOSIS — E11311 Type 2 diabetes mellitus with unspecified diabetic retinopathy with macular edema: Secondary | ICD-10-CM

## 2017-01-18 DIAGNOSIS — I10 Essential (primary) hypertension: Secondary | ICD-10-CM

## 2017-01-18 DIAGNOSIS — H35033 Hypertensive retinopathy, bilateral: Secondary | ICD-10-CM | POA: Diagnosis not present

## 2017-03-02 ENCOUNTER — Encounter (INDEPENDENT_AMBULATORY_CARE_PROVIDER_SITE_OTHER): Payer: Managed Care, Other (non HMO) | Admitting: Ophthalmology

## 2017-03-02 DIAGNOSIS — E11311 Type 2 diabetes mellitus with unspecified diabetic retinopathy with macular edema: Secondary | ICD-10-CM | POA: Diagnosis not present

## 2017-03-02 DIAGNOSIS — H35371 Puckering of macula, right eye: Secondary | ICD-10-CM

## 2017-03-02 DIAGNOSIS — I10 Essential (primary) hypertension: Secondary | ICD-10-CM | POA: Diagnosis not present

## 2017-03-02 DIAGNOSIS — H43811 Vitreous degeneration, right eye: Secondary | ICD-10-CM

## 2017-03-02 DIAGNOSIS — H35033 Hypertensive retinopathy, bilateral: Secondary | ICD-10-CM

## 2017-03-02 DIAGNOSIS — E113513 Type 2 diabetes mellitus with proliferative diabetic retinopathy with macular edema, bilateral: Secondary | ICD-10-CM | POA: Diagnosis not present

## 2017-04-13 ENCOUNTER — Encounter (INDEPENDENT_AMBULATORY_CARE_PROVIDER_SITE_OTHER): Payer: Managed Care, Other (non HMO) | Admitting: Ophthalmology

## 2017-04-13 DIAGNOSIS — H35033 Hypertensive retinopathy, bilateral: Secondary | ICD-10-CM | POA: Diagnosis not present

## 2017-04-13 DIAGNOSIS — E11311 Type 2 diabetes mellitus with unspecified diabetic retinopathy with macular edema: Secondary | ICD-10-CM

## 2017-04-13 DIAGNOSIS — H35373 Puckering of macula, bilateral: Secondary | ICD-10-CM | POA: Diagnosis not present

## 2017-04-13 DIAGNOSIS — E113591 Type 2 diabetes mellitus with proliferative diabetic retinopathy without macular edema, right eye: Secondary | ICD-10-CM

## 2017-04-13 DIAGNOSIS — I10 Essential (primary) hypertension: Secondary | ICD-10-CM

## 2017-04-13 DIAGNOSIS — H43813 Vitreous degeneration, bilateral: Secondary | ICD-10-CM

## 2017-04-13 DIAGNOSIS — E113512 Type 2 diabetes mellitus with proliferative diabetic retinopathy with macular edema, left eye: Secondary | ICD-10-CM | POA: Diagnosis not present

## 2017-05-04 ENCOUNTER — Emergency Department
Admission: EM | Admit: 2017-05-04 | Discharge: 2017-05-04 | Disposition: A | Discharge status: Home / Self Care | Payer: Managed Care, Other (non HMO) | Source: Home / Self Care | Attending: Family Medicine | Admitting: Family Medicine

## 2017-05-04 ENCOUNTER — Encounter: Payer: Self-pay | Admitting: Emergency Medicine

## 2017-05-04 ENCOUNTER — Other Ambulatory Visit: Payer: Self-pay

## 2017-05-04 DIAGNOSIS — L089 Local infection of the skin and subcutaneous tissue, unspecified: Secondary | ICD-10-CM | POA: Diagnosis not present

## 2017-05-04 DIAGNOSIS — L723 Sebaceous cyst: Secondary | ICD-10-CM

## 2017-05-04 MED ORDER — DOXYCYCLINE HYCLATE 100 MG PO CAPS: 100.0000 mg | ORAL_CAPSULE | Freq: Two times a day (BID) | ORAL | 0 refills | Status: DC

## 2017-05-04 NOTE — ED Triage Notes (Signed)
Patient has a cyst on his neck and level of larynx that is scheduled to be removed 05/18/17; just today it has become more red, tender and larger; wants evaluation. Denies any difficult breathing/talking.

## 2017-05-04 NOTE — Discharge Instructions (Signed)
Leave bandage in place until follow-up visit on Saturday 05/05/17.

## 2017-05-04 NOTE — ED Provider Notes (Signed)
[facility]    CSN: [id number] Arrival date & time: 05/04/17  1819     History   Chief Complaint Chief Complaint  Patient presents with  . Mass    throat/neck    HPI [name] is a 67 y.o. male.   Patient has a cyst on his anterior neck that has gradually been increasing in size during the past year.  He is scheduled to see a dermatologist on 05/18/17, but the lesion has become significantly painful and red during the past two days.  No drainage from the lesion.   The history is provided by the patient.  Abscess  Abscess location: anterior neck. Size:  2.5cm Abscess quality: fluctuance, painful and redness   Abscess quality: not draining, no itching, no warmth and not weeping   Duration:  12 months Progression:  Worsening Pain details:    Quality:  Dull   Severity:  Mild   Duration:  2 days   Timing:  Constant   Progression:  Worsening Chronicity:  Chronic Context: not skin injury   Relieved by:  None tried Exacerbated by: contact. Ineffective treatments:  None tried Associated symptoms: no fever     Past Medical History:  Diagnosis Date  . Cancer ([place])    kidney cancer-both kidneys remain  . Chronic kidney disease    Robotic Kidney surgery for kidney cancer tumor-kidney remains-  . Coronary artery disease   . Diabetes mellitus (HCC)    Insulin pump -[name], and has a glucose transmitter attached.  . Diabetic retinopathy ([place])    left eye  . Hyperlipidemia   . Hypertension   . Sciatica   . Vitreous hemorrhage ([place])    left eye    Patient Active Problem List   Diagnosis Date Noted  . Vitreous hemorrhage, left eye ([place]) 12/12/2016  . Hypertensive heart disease 10/17/2016  . Essential hypertension 10/15/2014  . Complications affecting other specified body systems, hypertension 04/17/2013  . Mixed hyperlipidemia 04/17/2013  . Coronary atherosclerosis of native coronary artery 04/17/2013  . Bilateral carotid artery disease ([place])  04/17/2013  . Nonspecific abnormal electrocardiogram (ECG) (EKG) 04/17/2013    Past Surgical History:  Procedure Laterality Date  . CATARACT EXTRACTION, BILATERAL Bilateral    11'15  . CORONARY ARTERY BYPASS GRAFT  10/08   x4 vessels([place])  . DIAGNOSTIC LAPAROSCOPY    . EYE SURGERY     retina peele  . HERNIA REPAIR    . KIDNEY SURGERY     tumor removed  . PARS PLANA VITRECTOMY 27 GAUGE Left 12/12/2016   Procedure: PARS PLANA VITRECTOMY 34 GAUGE , ENDOLASER GAS/ FLUID EXCHAGNE;  Surgeon: Hayden Pedro, MD;  Location: [facility];  Service: Ophthalmology;  Laterality: Left;  Marland Kitchen VASECTOMY         Home Medications    Prior to Admission medications   Medication Sig Start Date End Date Taking? Authorizing Provider  aspirin EC 325 MG tablet Take 650 mg by mouth at bedtime as needed for moderate pain.    [provider]  Besifloxacin HCl (BESIVANCE OP) Place 1 drop into the left eye 4 (four) times daily. The day of and 3-4 days after eye injection every 6 weeks    [provider]  brimonidine (ALPHAGAN) 0.15 % ophthalmic solution Place 1 drop into the right eye 2 (two) times daily. 09/28/16   [provider]  brimonidine (ALPHAGAN) 0.2 % ophthalmic solution Place 1 drop into the left eye 2 (two) times daily. 12/13/16  Hayden Pedro, MD  clopidogrel (PLAVIX) 75 MG tablet TAKE 1 TABLET BY MOUTH DAILY WITH BREAKFAST 10/25/16   Jettie Booze, MD  dorzolamide-timolol (COSOPT) 22.3-6.8 MG/ML ophthalmic solution Place 1 drop into the right eye 2 (two) times daily.    [provider]  dorzolamide-timolol (COSOPT) 22.3-6.8 MG/ML ophthalmic solution Place 1 drop into the right eye 2 (two) times daily. 12/13/16   Hayden Pedro, MD  doxycycline (VIBRAMYCIN) 100 MG capsule Take 1 capsule (100 mg total) by mouth 2 (two) times daily. Take with food. 05/04/17   Kandra Nicolas, MD  ezetimibe (ZETIA) 10 MG tablet Take 1 tablet (10 mg total) by mouth daily. Patient  taking differently: Take 10 mg by mouth at bedtime.  10/17/16 01/15/17  Jettie Booze, MD  famotidine (PEPCID) 20 MG tablet Take 20 mg by mouth daily as needed for heartburn or indigestion.    [provider]  gatifloxacin (ZYMAXID) 0.5 % SOLN Place 1 drop into the left eye 4 (four) times daily. 12/13/16   Hayden Pedro, MD  Insulin Human (INSULIN PUMP) 100 unit/ml SOLN Inject into the skin. Use with insulin as directed    [provider]  loratadine (CLARITIN) 10 MG tablet Take 10 mg by mouth daily as needed for allergies.    [provider]  Multiple Vitamin (MULTIVITAMIN WITH MINERALS) TABS tablet Take 1 tablet by mouth 2 (two) times a week.     [provider]  NOVOLOG 100 UNIT/ML injection Inject 1 Units into the skin continuous. Basal rate 1 unit per hour, may vary.  Bolus 1 unit per 10g of carbs 09/22/16   [provider]  prednisoLONE acetate (PRED FORTE) 1 % ophthalmic suspension Place 1 drop into the left eye 4 (four) times daily. 12/13/16   Hayden Pedro, MD  quinapril-hydrochlorothiazide (ACCURETIC) 20-12.5 MG tablet TAKE ONE-HALF (1/2) TABLET BY MOUTH DAILY 10/25/16   Jettie Booze, MD  rosuvastatin (CRESTOR) 40 MG tablet Take 40 mg by mouth at bedtime.     [provider]    Family History Family History  Problem Relation Age of Onset  . Heart attack Father   . Heart attack Sister   . Diabetes Sister     Social History Social History   Tobacco Use  . Smoking status: Never Smoker  . Smokeless tobacco: Never Used  Substance Use Topics  . Alcohol use: No  . Drug use: No     Allergies   Patient has no known allergies.   Review of Systems Review of Systems  Constitutional: Negative for fever.  All other systems reviewed and are negative.    Physical Exam Triage Vital Signs ED Triage Vitals  Enc Vitals Group     BP 05/04/17 1927 (!) 145/79     Pulse Rate 05/04/17 1927 71     Resp 05/04/17  1927 (!) 1     Temp 05/04/17 1927 98.3 F (36.8 C)     Temp Source 05/04/17 1927 Oral     SpO2 05/04/17 1927 99 %     Weight 05/04/17 1928 165 lb (74.8 kg)     Height 05/04/17 1928 5\' 7"  (1.702 m)     Head Circumference --      Peak Flow --      Pain Score 05/04/17 1933 5     Pain Loc --      Pain Edu? --      Excl. in [place]? --  No data found.  Updated Vital Signs BP (!) 145/79 (BP Location: Right Arm)   Pulse 71   Temp 98.3 F (36.8 C) (Oral)   Resp (!) 1   Ht 5\' 7"  (1.702 m)   Wt 165 lb (74.8 kg)   SpO2 99%   BMI 25.84 kg/m   Visual Acuity Right Eye Distance:   Left Eye Distance:   Bilateral Distance:    Right Eye Near:   Left Eye Near:    Bilateral Near:     Physical Exam  Constitutional: He appears well-developed and well-nourished. No distress.  HENT:  Head: Normocephalic.  Right Ear: External ear normal.  Left Ear: External ear normal.  Nose: Nose normal.  Mouth/Throat: Oropharynx is clear and moist.  Eyes: Conjunctivae are normal. Pupils are equal, round, and reactive to light.  Neck: Neck supple.    Right anterior neck has a 2.5cm diameter erythematous fluctuant cystic lesion, tender to palpation, as noted on diagram.   Cardiovascular: Normal rate.  Pulmonary/Chest: Effort normal.  Lymphadenopathy:    He has no cervical adenopathy.  Neurological: He is alert.  Skin: Skin is warm and dry.  Nursing note and vitals reviewed.    UC Treatments / Results  Labs (all labs ordered are listed, but only abnormal results are displayed) Labs Reviewed  WOUND CULTURE    EKG  EKG Interpretation None       Radiology No results found.  Procedures Procedures  Incise and drain cyst/abscess Risks and benefits of procedure explained to patient and verbal consent obtained.  Using sterile technique, infiltrated local anesthesia with 1% lidocaine with epinephrine, and cleansed affected area with Betadine and saline. Identified the most fluctuant area of  lesion and incised with #11 blade.  Expressed blood and purulent material.  Inserted Iodoform gauze packing.  Bandage applied.  Patient tolerated well   Medications Ordered in UC Medications - No data to display   Initial Impression / Assessment and Plan / UC Course  I have reviewed the triage vital signs and the nursing notes.  Pertinent labs & imaging results that were available during my care of the patient were reviewed by me and considered in my medical decision making (see chart for details).    Wound culture pending.  Begin empiric doxycycline 100mg  BID. Leave bandage in place until follow-up visit on Saturday 05/05/17.    Final Clinical Impressions(s) / UC Diagnoses   Final diagnoses:  Infected sebaceous cyst    ED Discharge Orders        Ordered    doxycycline (VIBRAMYCIN) 100 MG capsule  2 times daily     05/04/17 2023           Kandra Nicolas, MD 05/07/17 2044

## 2017-05-05 ENCOUNTER — Encounter: Payer: Self-pay | Admitting: Emergency Medicine

## 2017-05-05 ENCOUNTER — Other Ambulatory Visit: Payer: Self-pay

## 2017-05-05 ENCOUNTER — Emergency Department (INDEPENDENT_AMBULATORY_CARE_PROVIDER_SITE_OTHER)
Admission: EM | Admit: 2017-05-05 | Discharge: 2017-05-05 | Disposition: A | Discharge status: Home / Self Care | Payer: Managed Care, Other (non HMO) | Source: Home / Self Care | Attending: Family Medicine | Admitting: Family Medicine

## 2017-05-05 DIAGNOSIS — Z4801 Encounter for change or removal of surgical wound dressing: Secondary | ICD-10-CM

## 2017-05-05 NOTE — Discharge Instructions (Signed)
Finish antibiotic.  Change bandage daily until healed.

## 2017-05-05 NOTE — ED Provider Notes (Signed)
[facility]    CSN: [id number] Arrival date & time: 05/05/17  1402     History   Chief Complaint Chief Complaint  Patient presents with  . Wound Check    HPI Jeremy Johnston is a 67 y.o. male.   Patient returns for dressing change and follow-up of I and D cyst anterior neck.  He has no complaints.   The history is provided by the patient.    Past Medical History:  Diagnosis Date  . Cancer ([place])    kidney cancer-both kidneys remain  . Chronic kidney disease    Robotic Kidney surgery for kidney cancer tumor-kidney remains-  . Coronary artery disease   . Diabetes mellitus (HCC)    Insulin pump -[name], and has a glucose transmitter attached.  . Diabetic retinopathy ([place])    left eye  . Hyperlipidemia   . Hypertension   . Sciatica   . Vitreous hemorrhage ([place])    left eye    Patient Active Problem List   Diagnosis Date Noted  . Vitreous hemorrhage, left eye ([place]) 12/12/2016  . Hypertensive heart disease 10/17/2016  . Essential hypertension 10/15/2014  . Complications affecting other specified body systems, hypertension 04/17/2013  . Mixed hyperlipidemia 04/17/2013  . Coronary atherosclerosis of native coronary artery 04/17/2013  . Bilateral carotid artery disease ([place]) 04/17/2013  . Nonspecific abnormal electrocardiogram (ECG) (EKG) 04/17/2013    Past Surgical History:  Procedure Laterality Date  . CATARACT EXTRACTION, BILATERAL Bilateral    11'15  . CORONARY ARTERY BYPASS GRAFT  10/08   x4 vessels([place])  . DIAGNOSTIC LAPAROSCOPY    . EYE SURGERY     retina peele  . HERNIA REPAIR    . KIDNEY SURGERY     tumor removed  . PARS PLANA VITRECTOMY 27 GAUGE Left 12/12/2016   Procedure: PARS PLANA VITRECTOMY 83 GAUGE , ENDOLASER GAS/ FLUID EXCHAGNE;  Surgeon: Hayden Pedro, MD;  Location: [facility];  Service: Ophthalmology;  Laterality: Left;  Marland Kitchen VASECTOMY         Home Medications    Prior to Admission medications   Medication Sig Start Date  End Date Taking? Authorizing Provider  aspirin EC 325 MG tablet Take 650 mg by mouth at bedtime as needed for moderate pain.    [provider]  Besifloxacin HCl (BESIVANCE OP) Place 1 drop into the left eye 4 (four) times daily. The day of and 3-4 days after eye injection every 6 weeks    [provider]  brimonidine (ALPHAGAN) 0.15 % ophthalmic solution Place 1 drop into the right eye 2 (two) times daily. 09/28/16   [provider]  brimonidine (ALPHAGAN) 0.2 % ophthalmic solution Place 1 drop into the left eye 2 (two) times daily. 12/13/16   Hayden Pedro, MD  clopidogrel (PLAVIX) 75 MG tablet TAKE 1 TABLET BY MOUTH DAILY WITH BREAKFAST 10/25/16   Jettie Booze, MD  dorzolamide-timolol (COSOPT) 22.3-6.8 MG/ML ophthalmic solution Place 1 drop into the right eye 2 (two) times daily.    [provider]  dorzolamide-timolol (COSOPT) 22.3-6.8 MG/ML ophthalmic solution Place 1 drop into the right eye 2 (two) times daily. 12/13/16   Hayden Pedro, MD  doxycycline (VIBRAMYCIN) 100 MG capsule Take 1 capsule (100 mg total) by mouth 2 (two) times daily. Take with food. 05/04/17   Kandra Nicolas, MD  ezetimibe (ZETIA) 10 MG tablet Take 1 tablet (10 mg total) by mouth daily. Patient taking differently: Take 10 mg by mouth at  bedtime.  10/17/16 01/15/17  Jettie Booze, MD  famotidine (PEPCID) 20 MG tablet Take 20 mg by mouth daily as needed for heartburn or indigestion.    [provider]  gatifloxacin (ZYMAXID) 0.5 % SOLN Place 1 drop into the left eye 4 (four) times daily. 12/13/16   Hayden Pedro, MD  Insulin Human (INSULIN PUMP) 100 unit/ml SOLN Inject into the skin. Use with insulin as directed    [provider]  loratadine (CLARITIN) 10 MG tablet Take 10 mg by mouth daily as needed for allergies.    [provider]  Multiple Vitamin (MULTIVITAMIN WITH MINERALS) TABS tablet Take 1 tablet by mouth 2 (two) times a week.      [provider]  NOVOLOG 100 UNIT/ML injection Inject 1 Units into the skin continuous. Basal rate 1 unit per hour, may vary.  Bolus 1 unit per 10g of carbs 09/22/16   [provider]  prednisoLONE acetate (PRED FORTE) 1 % ophthalmic suspension Place 1 drop into the left eye 4 (four) times daily. 12/13/16   Hayden Pedro, MD  quinapril-hydrochlorothiazide (ACCURETIC) 20-12.5 MG tablet TAKE ONE-HALF (1/2) TABLET BY MOUTH DAILY 10/25/16   Jettie Booze, MD  rosuvastatin (CRESTOR) 40 MG tablet Take 40 mg by mouth at bedtime.     [provider]    Family History Family History  Problem Relation Age of Onset  . Heart attack Father   . Heart attack Sister   . Diabetes Sister     Social History Social History   Tobacco Use  . Smoking status: Never Smoker  . Smokeless tobacco: Never Used  Substance Use Topics  . Alcohol use: No  . Drug use: No     Allergies   Patient has no known allergies.   Review of Systems Review of Systems  No fevers, chills, and sweats   Physical Exam Triage Vital Signs ED Triage Vitals [05/05/17 1414]  Enc Vitals Group     BP 123/64     Pulse Rate 65     Resp 16     Temp 98.3 F (36.8 C)     Temp Source Oral     SpO2 98 %     Weight      Height      Head Circumference      Peak Flow      Pain Score 1     Pain Loc      Pain Edu?      Excl. in [place]?    No data found.  Updated Vital Signs BP 123/64   Pulse 65   Temp 98.3 F (36.8 C) (Oral)   Resp 16   SpO2 98%   Visual Acuity Right Eye Distance:   Left Eye Distance:   Bilateral Distance:    Right Eye Near:   Left Eye Near:    Bilateral Near:     Physical Exam Nursing notes and Vital Signs reviewed. Appearance:  Patient appears in no acute distress Neck:  Previously incised cyst anterior neck has minimal erythema and tenderness to palpation.  Removed packing; shallow cavity remains with minimal purulent drainage.  No indication for further  packing.  Bandage applied.    UC Treatments / Results  Labs (all labs ordered are listed, but only abnormal results are displayed) Labs Reviewed - No data to display  EKG  EKG Interpretation None       Radiology No results found.  Procedures Procedures (including critical  care time)  Medications Ordered in UC Medications - No data to display   Initial Impression / Assessment and Plan / UC Course  I have reviewed the triage vital signs and the nursing notes.  Pertinent labs & imaging results that were available during my care of the patient were reviewed by me and considered in my medical decision making (see chart for details).    Wound culture pending. Finish antibiotic.  Change bandage daily until healed. Return as needed.   Final Clinical Impressions(s) / UC Diagnoses   Final diagnoses:  Dressing change or removal, surgical wound    ED Discharge Orders    None          Kandra Nicolas, MD 05/07/17 2116

## 2017-05-05 NOTE — ED Triage Notes (Signed)
Here for check on I & D of neck cyst yesterday.

## 2017-05-08 LAB — WOUND CULTURE
MICRO NUMBER: 90145299
SPECIMEN QUALITY:: ADEQUATE

## 2017-05-09 ENCOUNTER — Telehealth: Payer: Self-pay | Admitting: *Deleted

## 2017-05-09 NOTE — Telephone Encounter (Signed)
LM with Wcx results and to call back if he has any questions or concerns.  

## 2017-06-08 ENCOUNTER — Encounter (INDEPENDENT_AMBULATORY_CARE_PROVIDER_SITE_OTHER): Payer: Managed Care, Other (non HMO) | Admitting: Ophthalmology

## 2017-06-08 DIAGNOSIS — H43811 Vitreous degeneration, right eye: Secondary | ICD-10-CM | POA: Diagnosis not present

## 2017-06-08 DIAGNOSIS — E11311 Type 2 diabetes mellitus with unspecified diabetic retinopathy with macular edema: Secondary | ICD-10-CM

## 2017-06-08 DIAGNOSIS — H35373 Puckering of macula, bilateral: Secondary | ICD-10-CM | POA: Diagnosis not present

## 2017-06-08 DIAGNOSIS — H35033 Hypertensive retinopathy, bilateral: Secondary | ICD-10-CM | POA: Diagnosis not present

## 2017-06-08 DIAGNOSIS — I10 Essential (primary) hypertension: Secondary | ICD-10-CM

## 2017-06-08 DIAGNOSIS — E113513 Type 2 diabetes mellitus with proliferative diabetic retinopathy with macular edema, bilateral: Secondary | ICD-10-CM

## 2017-08-03 ENCOUNTER — Encounter (INDEPENDENT_AMBULATORY_CARE_PROVIDER_SITE_OTHER): Payer: Managed Care, Other (non HMO) | Admitting: Ophthalmology

## 2017-08-29 ENCOUNTER — Telehealth: Payer: Self-pay | Admitting: Interventional Cardiology

## 2017-08-29 NOTE — Telephone Encounter (Signed)
Returned call to patient who states that his BP has been on the lower side for the past few weeks. He states that his BP has been running about 100/50. He states that he has had a few times where it was 86/46. Patient states that when his BP is low he occasionally has some lightheadedness. He denies any additional symptoms. Patient has quinapril-hctz 20-12.5 mg tablet, he takes a half of a tablet once daily for a total of 10-6.25 mg once daily. He states that he has not taken it a few times and his BP without any medicine was 120-140/60-70s. Patient's HR always in the 60-70s. Patient was instructed to stay hydrated and to hold medicine if his SBP<100. Patient asking if he can come off of the medicine altogether. Made patient aware that I would forward to Dr. Irish Lack for review and recommendation.

## 2017-08-29 NOTE — Telephone Encounter (Signed)
New Message    Pt c/o BP issue:  1. What are your last 5 BP readings? 100/55 2. Are you having any other symptoms (ex. Dizziness, headache, blurred vision, passed out)? lightheadness  3. What is your medication issue?

## 2017-08-29 NOTE — Telephone Encounter (Signed)
Given his DM, he benefits from an ACE-I for renal protection and decreased risk of MI and stroke. WOuld change to quinapril 5 mg daily.  OK to stop HCTZ.

## 2017-08-30 MED ORDER — QUINAPRIL HCL 5 MG PO TABS: 5.0000 mg | ORAL_TABLET | Freq: Every day | ORAL | 0 refills | Status: DC

## 2017-08-30 NOTE — Telephone Encounter (Signed)
Patient calling back. Made patient aware that since he has DM, he would benefit from an ACE-I for renal protection and it will decrease his risk of MI and stroke. Made patient aware that Dr. Irish Lack would like to stop his quinapril-hctz and take quinapril 5 mg QD without any hctz. Instructed to continue to monitor BP and stay hydrated. Patient verbalized understanding and thanked me for the call. Rx sent to preferred pharmacy.

## 2017-08-30 NOTE — Telephone Encounter (Signed)
Attempted to contact patient but there was no answer. Unable to leave a message.

## 2017-09-21 ENCOUNTER — Other Ambulatory Visit: Payer: Self-pay | Admitting: Interventional Cardiology

## 2017-09-28 ENCOUNTER — Encounter (INDEPENDENT_AMBULATORY_CARE_PROVIDER_SITE_OTHER): Payer: Managed Care, Other (non HMO) | Admitting: Ophthalmology

## 2017-09-28 ENCOUNTER — Other Ambulatory Visit: Payer: Self-pay | Admitting: Interventional Cardiology

## 2017-09-28 DIAGNOSIS — E113513 Type 2 diabetes mellitus with proliferative diabetic retinopathy with macular edema, bilateral: Secondary | ICD-10-CM | POA: Diagnosis not present

## 2017-09-28 DIAGNOSIS — I10 Essential (primary) hypertension: Secondary | ICD-10-CM

## 2017-09-28 DIAGNOSIS — H35371 Puckering of macula, right eye: Secondary | ICD-10-CM

## 2017-09-28 DIAGNOSIS — H35033 Hypertensive retinopathy, bilateral: Secondary | ICD-10-CM | POA: Diagnosis not present

## 2017-09-28 DIAGNOSIS — H43811 Vitreous degeneration, right eye: Secondary | ICD-10-CM | POA: Diagnosis not present

## 2017-09-28 DIAGNOSIS — E11311 Type 2 diabetes mellitus with unspecified diabetic retinopathy with macular edema: Secondary | ICD-10-CM

## 2017-09-28 MED ORDER — QUINAPRIL HCL 5 MG PO TABS: 5.0000 mg | ORAL_TABLET | Freq: Every day | ORAL | 0 refills | Status: DC

## 2017-09-28 NOTE — Telephone Encounter (Signed)
New message     *STAT* If patient is at the pharmacy, call can be transferred to refill team.   1. Which medications need to be refilled? (please list name of each medication and dose if known) quinapril (ACCUPRIL) 5 MG tablet 2. Which pharmacy/location (including street and city if local pharmacy) is medication to be sent to? [facility]  3. Do they need a 30 day or 90 day supply? [place]

## 2017-10-08 ENCOUNTER — Encounter: Payer: Self-pay | Admitting: Physician Assistant

## 2017-10-18 ENCOUNTER — Ambulatory Visit: Payer: Managed Care, Other (non HMO) | Admitting: Cardiology

## 2017-10-22 NOTE — Progress Notes (Signed)
Cardiology Office Note    Date:  10/23/2017   ID:  Jeremy Johnston, DOB 04/27/1950, MRN [id number]  PCP:  Ivan Anchors, MD  Cardiologist: Larae Grooms, MD  Chief Complaint  Patient presents with  . Follow-up    History of Present Illness:  Jeremy Johnston is a 67 y.o. male is a 67 year old male patient with history of CAD status post CABG in 2008 at which time he was asymptomatic and routine EKG prompted stress testing that led to CABG.  Stress test in 2011 showed small inferior defect and ETT in 2014, 2016, 2017, 11/2016 normal .   He also has diabetes mellitus, carotid disease, HLD.  Last saw Dr. Irish Lack 10/2016 at which time LDL was 110 and Zetia was added to Crestor.  Recommended considering PCSK9 inhibitor if we could not get him to target.  Patient comes in for yearly f/u. His BP has been running low and he has been light headed in am. Readings of 100/55. Quinipril decreased to 5 mg daily and HCTZ stopped. Doing better and BP 110/60.    Patient thinks he has been getting dehydrated and not drinking enough water.Aspirin stopped last year by Dr. Irish Lack because of hemorrhaging in the eye.  Plavix was continued.  Patient works for [employer].  Patient bowls 6-7 games 3x/week. Hasn't been exercising for the past 2 to 3 months because his daughter moved back from [place] and is in a treadmill room.  Denies chest pain, palpitations, dyspnea or other cardiac complaints.   Past Medical History:  Diagnosis Date  . Cancer ([place])    kidney cancer-both kidneys remain  . Chronic kidney disease    Robotic Kidney surgery for kidney cancer tumor-kidney remains-  . Coronary artery disease   . Diabetes mellitus (HCC)    Insulin pump -[name], and has a glucose transmitter attached.  . Diabetic retinopathy ([place])    left eye  . Hyperlipidemia   . Hypertension   . Sciatica   . Vitreous hemorrhage (HCC)    left eye    Past Surgical History:  Procedure Laterality Date  .  CATARACT EXTRACTION, BILATERAL Bilateral    11'15  . CORONARY ARTERY BYPASS GRAFT  10/08   x4 vessels([place])  . DIAGNOSTIC LAPAROSCOPY    . EYE SURGERY     retina peele  . HERNIA REPAIR    . KIDNEY SURGERY     tumor removed  . PARS PLANA VITRECTOMY 27 GAUGE Left 12/12/2016   Procedure: PARS PLANA VITRECTOMY 28 GAUGE , ENDOLASER GAS/ FLUID EXCHAGNE;  Surgeon: Hayden Pedro, MD;  Location: [facility];  Service: Ophthalmology;  Laterality: Left;  Marland Kitchen VASECTOMY      Current Medications: Current Meds  Medication Sig  . aspirin EC 325 MG tablet Take 650 mg by mouth at bedtime as needed for moderate pain.  Marland Kitchen Besifloxacin HCl (BESIVANCE OP) Place 1 drop into the left eye 4 (four) times daily. The day of and 3-4 days after eye injection every 6 weeks  . brimonidine (ALPHAGAN) 0.15 % ophthalmic solution Place 1 drop into the right eye 2 (two) times daily.  . clopidogrel (PLAVIX) 75 MG tablet TAKE 1 TABLET BY MOUTH DAILY WITH BREAKFAST  . dorzolamide-timolol (COSOPT) 22.3-6.8 MG/ML ophthalmic solution Place 1 drop into the right eye 2 (two) times daily.  Marland Kitchen ezetimibe (ZETIA) 10 MG tablet Take 1 tablet (10 mg total) by mouth daily. Please keep upcoming appt for future refills. Thank you.  . famotidine (  PEPCID) 20 MG tablet Take 20 mg by mouth daily as needed for heartburn or indigestion.  . Insulin Human (INSULIN PUMP) 100 unit/ml SOLN Inject into the skin. Use with insulin as directed  . loratadine (CLARITIN) 10 MG tablet Take 10 mg by mouth daily as needed for allergies.  . Multiple Vitamin (MULTIVITAMIN WITH MINERALS) TABS tablet Take 1 tablet by mouth 2 (two) times a week.   Marland Kitchen NOVOLOG 100 UNIT/ML injection Inject 1 Units into the skin continuous. Basal rate 1 unit per hour, may vary.  Bolus 1 unit per 10g of carbs  . quinapril (ACCUPRIL) 5 MG tablet Take 1 tablet (5 mg total) by mouth daily.  . rosuvastatin (CRESTOR) 40 MG tablet Take 40 mg by mouth at bedtime.      Allergies:   Patient has no  known allergies.   Social History   Socioeconomic History  . Marital status: Married    Spouse name: Not on file  . Number of children: Not on file  . Years of education: Not on file  . Highest education level: Not on file  Occupational History  . Not on file  Social Needs  . Financial resource strain: Not on file  . Food insecurity:    Worry: Not on file    Inability: Not on file  . Transportation needs:    Medical: Not on file    Non-medical: Not on file  Tobacco Use  . Smoking status: Never Smoker  . Smokeless tobacco: Never Used  Substance and Sexual Activity  . Alcohol use: No  . Drug use: No  . Sexual activity: Not on file  Lifestyle  . Physical activity:    Days per week: Not on file    Minutes per session: Not on file  . Stress: Not on file  Relationships  . Social connections:    Talks on phone: Not on file    Gets together: Not on file    Attends religious service: Not on file    Active member of club or organization: Not on file    Attends meetings of clubs or organizations: Not on file    Relationship status: Not on file  Other Topics Concern  . Not on file  Social History Narrative  . Not on file     Family History:  The patient's family history includes Diabetes in his sister; Heart attack in his father and sister.   ROS:   Please see the history of present illness.    Review of Systems  Constitution: Negative.  HENT: Negative.   Cardiovascular: Negative.   Respiratory: Negative.   Endocrine: Negative.   Hematologic/Lymphatic: Negative.   Musculoskeletal: Negative.   Gastrointestinal: Negative.   Genitourinary: Negative.   Neurological: Negative.    All other systems reviewed and are negative.   PHYSICAL EXAM:   VS:  BP 106/70   Pulse 73   Ht 5\' 7"  (1.702 m)   Wt 160 lb 12 oz (72.9 kg)   SpO2 98%   BMI 25.18 kg/m   Physical Exam  GEN: Well nourished, well developed, in no acute distress  Neck: no JVD, carotid bruits, or  masses Cardiac:RRR; 2/6 systolic murmur at the left sternal border  respiratory:  clear to auscultation bilaterally, normal work of breathing GI: soft, nontender, nondistended, + BS Ext: without cyanosis, clubbing, or edema, Good distal pulses bilaterally Neuro:  Alert and Oriented x 3, Strength and sensation are intact Psych: euthymic mood, full affect  Wt Readings  from Last 3 Encounters:  10/23/17 160 lb 12 oz (72.9 kg)  05/04/17 165 lb (74.8 kg)  12/12/16 177 lb 7.5 oz (80.5 kg)      Studies/Labs Reviewed:   EKG:  EKG is  ordered today.  The ekg ordered today demonstrates normal sinus rhythm inferior Q waves and nonspecific ST-T wave changes, no acute change from prior EKGs  Recent Labs: 12/12/2016: BUN 17; Creatinine, Ser 0.92; Hemoglobin 15.0; Platelets 233; Potassium 4.6; Sodium 138   Lipid Panel No results found for: CHOL, TRIG, HDL, CHOLHDL, VLDL, LDLCALC, LDLDIRECT  Additional studies/ records that were reviewed today include:  GXT 11/06/2016  Study Highlights    Blood pressure demonstrated a normal response to exercise.  There was no ST segment deviation noted during stress.  Negative exercise treadmill test for ischemia at adequate workload. The patient achieved 97% MPHR and 11.67mets.   Carotid Dopplers 11/2016 Heterogeneous plaque, bilaterally. Stable 1-39% RICA stenosis. Stable 62-26% LICA stenosis. Normal subclavian arteries, bilaterally. Patent vertebral arteries with antegrade flow. f/u 1 year   ASSESSMENT:    1. Atherosclerosis of native coronary artery of native heart with angina pectoris ([place])   2. Essential hypertension   3. Bilateral carotid artery stenosis   4. Mixed hyperlipidemia      PLAN:  In order of problems listed above:  CAD status post CABG in 2008 mostly asymptomatic with this and gets yearly stress test.  Last one was 11/2016 was normal .  Aspirin stopped last year because of hemorrhaging in the eye.  Still on Plavix.  Will order  GXT and maintenance labs.   Essential hypertension blood pressure has been on the low side and he had some dizziness.  Doing much better on lower dose Accupril and off HCTZ.  Thinks he got dehydrated.  Trying to drink more water.   Bilateral carotid artery stenosis followed yearly with Dopplers-we will check carotid Dopplers.  Mixed hyperlipidemia on Crestor and Zetia LDL down to 76 on labs checked in January.  Will repeat when he comes back for his GXT.Marland Kitchen    Medication Adjustments/Labs and Tests Ordered: Current medicines are reviewed at length with the patient today.  Concerns regarding medicines are outlined above.  Medication changes, Labs and Tests ordered today are listed in the Patient Instructions below. There are no Patient Instructions on file for this visit.   Sumner Boast, PA-C  10/23/2017 9:31 AM    [facility] [address], [place], [place]  [address] Phone: [phone]; Fax: [phone]

## 2017-10-23 ENCOUNTER — Encounter: Payer: Self-pay | Admitting: Physician Assistant

## 2017-10-23 ENCOUNTER — Encounter (INDEPENDENT_AMBULATORY_CARE_PROVIDER_SITE_OTHER): Payer: Self-pay

## 2017-10-23 ENCOUNTER — Ambulatory Visit: Payer: Managed Care, Other (non HMO) | Admitting: Physician Assistant

## 2017-10-23 VITALS — BP 106/70 | HR 73 | Ht 67.0 in | Wt 160.8 lb

## 2017-10-23 DIAGNOSIS — I25119 Atherosclerotic heart disease of native coronary artery with unspecified angina pectoris: Secondary | ICD-10-CM

## 2017-10-23 DIAGNOSIS — E782 Mixed hyperlipidemia: Secondary | ICD-10-CM | POA: Diagnosis not present

## 2017-10-23 DIAGNOSIS — I6523 Occlusion and stenosis of bilateral carotid arteries: Secondary | ICD-10-CM

## 2017-10-23 DIAGNOSIS — I10 Essential (primary) hypertension: Secondary | ICD-10-CM | POA: Diagnosis not present

## 2017-10-23 NOTE — Patient Instructions (Signed)
Medication Instructions:  Your physician recommends that you continue on your current medications as directed. Please refer to the Current Medication list given to you today.   Labwork: Labs to be done when stress test is done. BMET, CBC, Fasting Lipids, CMET.  Testing/Procedures: Your physician has requested that you have a carotid duplex. This test is an ultrasound of the carotid arteries in your neck. It looks at blood flow through these arteries that supply the brain with blood. Allow one hour for this exam. There are no restrictions or special instructions.  Your physician has requested that you have an exercise tolerance test. For further information please visit [email or web address]. Please also follow instruction sheet, as given.    Follow-Up: Your physician wants you to follow-up in: 1 year with Dr. Irish Lack. You will receive a reminder letter in the mail two months in advance. If you don't receive a letter, please call our office to schedule the follow-up appointment.   Any Other Special Instructions Will Be Listed Below (If Applicable).     If you need a refill on your cardiac medications before your next appointment, please call your pharmacy.

## 2017-10-23 NOTE — Addendum Note (Signed)
Addended by: Jacinta Shoe on: 10/23/2017 10:08 AM   Modules accepted: Orders

## 2017-10-25 ENCOUNTER — Other Ambulatory Visit: Payer: Self-pay | Admitting: *Deleted

## 2017-10-25 ENCOUNTER — Ambulatory Visit (HOSPITAL_COMMUNITY)
Admission: RE | Admit: 2017-10-25 | Discharge: 2017-10-25 | Disposition: A | Discharge status: Home / Self Care | Payer: Managed Care, Other (non HMO) | Source: Ambulatory Visit | Attending: Cardiology | Admitting: Cardiology

## 2017-10-25 DIAGNOSIS — I6523 Occlusion and stenosis of bilateral carotid arteries: Secondary | ICD-10-CM | POA: Insufficient documentation

## 2017-10-25 DIAGNOSIS — E782 Mixed hyperlipidemia: Secondary | ICD-10-CM

## 2017-10-25 DIAGNOSIS — I25119 Atherosclerotic heart disease of native coronary artery with unspecified angina pectoris: Secondary | ICD-10-CM

## 2017-10-25 DIAGNOSIS — I10 Essential (primary) hypertension: Secondary | ICD-10-CM

## 2017-10-25 LAB — COMPREHENSIVE METABOLIC PANEL
ALK PHOS: 96 IU/L (ref 39–117)
ALT: 28 IU/L (ref 0–44)
AST: 28 IU/L (ref 0–40)
Albumin/Globulin Ratio: 1.8 (ref 1.2–2.2)
Albumin: 4.3 g/dL (ref 3.6–4.8)
BILIRUBIN TOTAL: 0.3 mg/dL (ref 0.0–1.2)
BUN/Creatinine Ratio: 16 (ref 10–24)
BUN: 17 mg/dL (ref 8–27)
CHLORIDE: 101 mmol/L (ref 96–106)
CO2: 24 mmol/L (ref 20–29)
CREATININE: 1.09 mg/dL (ref 0.76–1.27)
Calcium: 9.1 mg/dL (ref 8.6–10.2)
GFR calc Af Amer: 81 mL/min/{1.73_m2} (ref >59)
GFR calc non Af Amer: 70 mL/min/{1.73_m2} (ref >59)
Globulin, Total: 2.4 g/dL (ref 1.5–4.5)
Glucose: 122 mg/dL — ABNORMAL HIGH (ref 65–99)
POTASSIUM: 4.5 mmol/L (ref 3.5–5.2)
Sodium: 140 mmol/L (ref 134–144)
Total Protein: 6.7 g/dL (ref 6.0–8.5)

## 2017-10-25 LAB — LIPID PANEL
CHOL/HDL RATIO: 3.2 ratio (ref 0.0–5.0)
CHOLESTEROL TOTAL: 127 mg/dL (ref 100–199)
HDL: 40 mg/dL (ref >39)
LDL CALC: 75 mg/dL (ref 0–99)
TRIGLYCERIDES: 58 mg/dL (ref 0–149)
VLDL Cholesterol Cal: 12 mg/dL (ref 5–40)

## 2017-10-25 LAB — CBC
HEMATOCRIT: 45.9 % (ref 37.5–51.0)
Hemoglobin: 15.3 g/dL (ref 13.0–17.7)
MCH: 29.8 pg (ref 26.6–33.0)
MCHC: 33.3 g/dL (ref 31.5–35.7)
MCV: 90 fL (ref 79–97)
Platelets: 233 10*3/uL (ref 150–450)
RBC: 5.13 x10E6/uL (ref 4.14–5.80)
RDW: 13.7 % (ref 12.3–15.4)
WBC: 6.7 10*3/uL (ref 3.4–10.8)

## 2017-10-26 ENCOUNTER — Other Ambulatory Visit: Payer: Self-pay | Admitting: *Deleted

## 2017-10-26 ENCOUNTER — Ambulatory Visit (INDEPENDENT_AMBULATORY_CARE_PROVIDER_SITE_OTHER): Payer: Managed Care, Other (non HMO)

## 2017-10-26 DIAGNOSIS — I6523 Occlusion and stenosis of bilateral carotid arteries: Secondary | ICD-10-CM

## 2017-10-26 DIAGNOSIS — I10 Essential (primary) hypertension: Secondary | ICD-10-CM | POA: Diagnosis not present

## 2017-10-26 DIAGNOSIS — E782 Mixed hyperlipidemia: Secondary | ICD-10-CM

## 2017-10-26 LAB — EXERCISE TOLERANCE TEST
CHL CUP MPHR: 154 {beats}/min
CHL CUP RESTING HR STRESS: 71 {beats}/min
CSEPEW: 10.1 METS
CSEPPHR: 146 {beats}/min
Exercise duration (min): 9 min
Exercise duration (sec): 0 s
Percent HR: 94 %
RPE: 15

## 2017-10-26 NOTE — Progress Notes (Signed)
Pt has been made aware of normal result and verbalized understanding.  jw 10/26/17

## 2017-11-12 ENCOUNTER — Other Ambulatory Visit: Payer: Self-pay | Admitting: Interventional Cardiology

## 2017-11-17 ENCOUNTER — Other Ambulatory Visit: Payer: Self-pay | Admitting: Interventional Cardiology

## 2017-11-23 ENCOUNTER — Encounter (INDEPENDENT_AMBULATORY_CARE_PROVIDER_SITE_OTHER): Payer: Managed Care, Other (non HMO) | Admitting: Ophthalmology

## 2017-11-23 DIAGNOSIS — E113512 Type 2 diabetes mellitus with proliferative diabetic retinopathy with macular edema, left eye: Secondary | ICD-10-CM

## 2017-11-23 DIAGNOSIS — H43812 Vitreous degeneration, left eye: Secondary | ICD-10-CM

## 2017-11-23 DIAGNOSIS — E113591 Type 2 diabetes mellitus with proliferative diabetic retinopathy without macular edema, right eye: Secondary | ICD-10-CM

## 2017-11-23 DIAGNOSIS — I10 Essential (primary) hypertension: Secondary | ICD-10-CM

## 2017-11-23 DIAGNOSIS — E11311 Type 2 diabetes mellitus with unspecified diabetic retinopathy with macular edema: Secondary | ICD-10-CM | POA: Diagnosis not present

## 2017-11-23 DIAGNOSIS — H35033 Hypertensive retinopathy, bilateral: Secondary | ICD-10-CM

## 2017-12-18 ENCOUNTER — Other Ambulatory Visit: Payer: Self-pay | Admitting: Interventional Cardiology

## 2018-01-18 ENCOUNTER — Encounter (INDEPENDENT_AMBULATORY_CARE_PROVIDER_SITE_OTHER): Payer: Managed Care, Other (non HMO) | Admitting: Ophthalmology

## 2018-01-18 DIAGNOSIS — H35033 Hypertensive retinopathy, bilateral: Secondary | ICD-10-CM

## 2018-01-18 DIAGNOSIS — E11311 Type 2 diabetes mellitus with unspecified diabetic retinopathy with macular edema: Secondary | ICD-10-CM

## 2018-01-18 DIAGNOSIS — I10 Essential (primary) hypertension: Secondary | ICD-10-CM

## 2018-01-18 DIAGNOSIS — E113591 Type 2 diabetes mellitus with proliferative diabetic retinopathy without macular edema, right eye: Secondary | ICD-10-CM

## 2018-01-18 DIAGNOSIS — H43811 Vitreous degeneration, right eye: Secondary | ICD-10-CM

## 2018-01-18 DIAGNOSIS — E113512 Type 2 diabetes mellitus with proliferative diabetic retinopathy with macular edema, left eye: Secondary | ICD-10-CM | POA: Diagnosis not present

## 2018-01-18 DIAGNOSIS — H35371 Puckering of macula, right eye: Secondary | ICD-10-CM

## 2018-02-11 ENCOUNTER — Other Ambulatory Visit: Payer: Self-pay

## 2018-02-11 ENCOUNTER — Emergency Department
Admission: EM | Admit: 2018-02-11 | Discharge: 2018-02-11 | Disposition: A | Discharge status: Home / Self Care | Payer: Managed Care, Other (non HMO) | Source: Home / Self Care | Attending: Family Medicine | Admitting: Family Medicine

## 2018-02-11 DIAGNOSIS — H6011 Cellulitis of right external ear: Secondary | ICD-10-CM | POA: Diagnosis not present

## 2018-02-11 MED ORDER — DOXYCYCLINE HYCLATE 100 MG PO CAPS: 100.0000 mg | ORAL_CAPSULE | Freq: Two times a day (BID) | ORAL | 0 refills | Status: AC

## 2018-02-11 NOTE — Discharge Instructions (Signed)
°  Please take antibiotics as prescribed and be sure to complete entire course even if you start to feel better to ensure infection does not come back.  Please follow up with family medicine in 1 week if not improving, sooner if worsening.

## 2018-02-11 NOTE — ED Triage Notes (Signed)
Pt stated that his left ear became inflamed about a week and a half ago.  It resolved, and about 4 days ago, right ear began tingling, with pain into neck and back of head,

## 2018-02-11 NOTE — ED Provider Notes (Signed)
[facility]    CSN: [id number] Arrival date & time: 02/11/18  1548     History   Chief Complaint Chief Complaint  Patient presents with  . Otalgia    HPI Jeremy Johnston is a 67 y.o. male.   HPI Jeremy Johnston is a 67 y.o. male presenting to UC with c/o Left ear pain, redness and swelling that came and went 1-2 weeks ago. He is now developing pain, redness, and swelling of Right external ear over the last 4 days w/o improvement.  Hx of similar symptoms about 2 years ago, he was tx for cellulitis.  He notes getting a cat recently and wonders if he is getting bacteria from the cat on his ear but denies scratches to his ear. Denies fever or chills. No drainage from his ear or internal ear pain. He has not tried anything for his symptoms yet.    Past Medical History:  Diagnosis Date  . Cancer ([place])    kidney cancer-both kidneys remain  . Chronic kidney disease    Robotic Kidney surgery for kidney cancer tumor-kidney remains-  . Coronary artery disease   . Diabetes mellitus (HCC)    Insulin pump -[name], and has a glucose transmitter attached.  . Diabetic retinopathy ([place])    left eye  . Hyperlipidemia   . Hypertension   . Sciatica   . Vitreous hemorrhage ([place])    left eye    Patient Active Problem List   Diagnosis Date Noted  . Vitreous hemorrhage, left eye ([place]) 12/12/2016  . Hypertensive heart disease 10/17/2016  . Essential hypertension 10/15/2014  . Complications affecting other specified body systems, hypertension 04/17/2013  . Mixed hyperlipidemia 04/17/2013  . Coronary atherosclerosis of native coronary artery 04/17/2013  . Bilateral carotid artery disease ([place]) 04/17/2013  . Nonspecific abnormal electrocardiogram (ECG) (EKG) 04/17/2013    Past Surgical History:  Procedure Laterality Date  . CATARACT EXTRACTION, BILATERAL Bilateral    11'15  . CORONARY ARTERY BYPASS GRAFT  10/08   x4 vessels([place])  . DIAGNOSTIC LAPAROSCOPY    . EYE SURGERY       retina peele  . HERNIA REPAIR    . KIDNEY SURGERY     tumor removed  . PARS PLANA VITRECTOMY 27 GAUGE Left 12/12/2016   Procedure: PARS PLANA VITRECTOMY 15 GAUGE , ENDOLASER GAS/ FLUID EXCHAGNE;  Surgeon: Hayden Pedro, MD;  Location: [facility];  Service: Ophthalmology;  Laterality: Left;  Marland Kitchen VASECTOMY         Home Medications    Prior to Admission medications   Medication Sig Start Date End Date Taking? Authorizing Provider  aspirin EC 325 MG tablet Take 650 mg by mouth at bedtime as needed for moderate pain.    [provider]  Besifloxacin HCl (BESIVANCE OP) Place 1 drop into the left eye 4 (four) times daily. The day of and 3-4 days after eye injection every 6 weeks    [provider]  brimonidine (ALPHAGAN) 0.15 % ophthalmic solution Place 1 drop into the right eye 2 (two) times daily. 09/28/16   [provider]  clopidogrel (PLAVIX) 75 MG tablet TAKE 1 TABLET BY MOUTH DAILY WITH BREAKFAST 11/12/17   Jettie Booze, MD  dorzolamide-timolol (COSOPT) 22.3-6.8 MG/ML ophthalmic solution Place 1 drop into the right eye 2 (two) times daily.    [provider]  doxycycline (VIBRAMYCIN) 100 MG capsule Take 1 capsule (100 mg total) by mouth 2 (two) times daily for 10 days. 02/11/18 02/21/18  Noe Gens, PA-C  ezetimibe (ZETIA) 10 MG tablet Take 1 tablet (10 mg total) by mouth daily. 12/18/17   Jettie Booze, MD  famotidine (PEPCID) 20 MG tablet Take 20 mg by mouth daily as needed for heartburn or indigestion.    [provider]  Insulin Human (INSULIN PUMP) 100 unit/ml SOLN Inject into the skin. Use with insulin as directed    [provider]  loratadine (CLARITIN) 10 MG tablet Take 10 mg by mouth daily as needed for allergies.    [provider]  Multiple Vitamin (MULTIVITAMIN WITH MINERALS) TABS tablet Take 1 tablet by mouth 2 (two) times a week.     [provider]  NOVOLOG 100 UNIT/ML injection Inject 1  Units into the skin continuous. Basal rate 1 unit per hour, may vary.  Bolus 1 unit per 10g of carbs 09/22/16   [provider]  quinapril (ACCUPRIL) 5 MG tablet TAKE 1 TABLET BY MOUTH DAILY 11/20/17   Jettie Booze, MD  quinapril-hydrochlorothiazide (ACCURETIC) 20-12.5 MG tablet TAKE ONE-HALF (1/2) TABLET BY MOUTH DAILY 11/12/17   Jettie Booze, MD  rosuvastatin (CRESTOR) 40 MG tablet Take 40 mg by mouth at bedtime.     [provider]    Family History Family History  Problem Relation Age of Onset  . Heart attack Father   . Heart attack Sister   . Diabetes Sister     Social History Social History   Tobacco Use  . Smoking status: Never Smoker  . Smokeless tobacco: Never Used  Substance Use Topics  . Alcohol use: No  . Drug use: No     Allergies   Patient has no known allergies.   Review of Systems Review of Systems  Constitutional: Negative for chills and fever.  HENT: Positive for ear pain. Negative for congestion, ear discharge, hearing loss and sore throat.   Skin: Positive for rash. Negative for wound.     Physical Exam Triage Vital Signs ED Triage Vitals  Enc Vitals Group     BP 02/11/18 1611 (!) 147/79     Pulse Rate 02/11/18 1611 67     Resp --      Temp 02/11/18 1611 98.2 F (36.8 C)     Temp Source 02/11/18 1611 Oral     SpO2 02/11/18 1611 97 %     Weight 02/11/18 1615 167 lb (75.8 kg)     Height 02/11/18 1615 5\' 7"  (1.702 m)     Head Circumference --      Peak Flow --      Pain Score 02/11/18 1615 7     Pain Loc --      Pain Edu? --      Excl. in [place]? --    No data found.  Updated Vital Signs BP (!) 147/79 (BP Location: Right Arm)   Pulse 67   Temp 98.2 F (36.8 C) (Oral)   Ht 5\' 7"  (1.702 m)   Wt 167 lb (75.8 kg)   SpO2 97%   BMI 26.16 kg/m   Visual Acuity Right Eye Distance:   Left Eye Distance:   Bilateral Distance:    Right Eye Near:   Left Eye Near:    Bilateral Near:     Physical Exam    Constitutional: He is oriented to person, place, and time. He appears well-developed and well-nourished.  HENT:  Head: Normocephalic and atraumatic.  Right Ear: There is swelling and tenderness. No drainage. No mastoid  tenderness. Tympanic membrane is not erythematous.  Left Ear: Tympanic membrane normal.  Right external ear: erythema, mild to moderate edema, tenderness. No fluctuance or induration. No mastoid tenderness.   Eyes: EOM are normal.  Neck: Normal range of motion. Neck supple.  Cardiovascular: Normal rate and regular rhythm.  Pulmonary/Chest: Effort normal and breath sounds normal. No stridor. No respiratory distress. He has no wheezes. He has no rales.  Musculoskeletal: Normal range of motion.  Neurological: He is alert and oriented to person, place, and time.  Skin: Skin is warm and dry.  Psychiatric: He has a normal mood and affect. His behavior is normal.  Nursing note and vitals reviewed.    UC Treatments / Results  Labs (all labs ordered are listed, but only abnormal results are displayed) Labs Reviewed - No data to display  EKG None  Radiology No results found.  Procedures Procedures (including critical care time)  Medications Ordered in UC Medications - No data to display  Initial Impression / Assessment and Plan / UC Course  I have reviewed the triage vital signs and the nursing notes.  Pertinent labs & imaging results that were available during my care of the patient were reviewed by me and considered in my medical decision making (see chart for details).     Exam c/w cellulitis w/o evidence of abscess at this time Home care instructions provided  Final Clinical Impressions(s) / UC Diagnoses   Final diagnoses:  Cellulitis of right external ear     Discharge Instructions      Please take antibiotics as prescribed and be sure to complete entire course even if you start to feel better to ensure infection does not come back.  Please follow  up with family medicine in 1 week if not improving, sooner if worsening.     ED Prescriptions    Medication Sig Dispense Auth. Provider   doxycycline (VIBRAMYCIN) 100 MG capsule Take 1 capsule (100 mg total) by mouth 2 (two) times daily for 10 days. 20 capsule Noe Gens, PA-C     Controlled Substance Prescriptions [place] Controlled Substance Registry consulted? Not Applicable   Tyrell Antonio 02/11/18 [id number]

## 2018-03-15 ENCOUNTER — Encounter (INDEPENDENT_AMBULATORY_CARE_PROVIDER_SITE_OTHER): Payer: Managed Care, Other (non HMO) | Admitting: Ophthalmology

## 2018-03-15 DIAGNOSIS — I10 Essential (primary) hypertension: Secondary | ICD-10-CM

## 2018-03-15 DIAGNOSIS — H35033 Hypertensive retinopathy, bilateral: Secondary | ICD-10-CM

## 2018-03-15 DIAGNOSIS — E113513 Type 2 diabetes mellitus with proliferative diabetic retinopathy with macular edema, bilateral: Secondary | ICD-10-CM

## 2018-03-15 DIAGNOSIS — H43811 Vitreous degeneration, right eye: Secondary | ICD-10-CM

## 2018-03-15 DIAGNOSIS — E11311 Type 2 diabetes mellitus with unspecified diabetic retinopathy with macular edema: Secondary | ICD-10-CM | POA: Diagnosis not present

## 2018-03-18 ENCOUNTER — Encounter (INDEPENDENT_AMBULATORY_CARE_PROVIDER_SITE_OTHER): Payer: Managed Care, Other (non HMO) | Admitting: Ophthalmology

## 2018-03-18 DIAGNOSIS — H4312 Vitreous hemorrhage, left eye: Secondary | ICD-10-CM

## 2018-03-22 ENCOUNTER — Encounter (INDEPENDENT_AMBULATORY_CARE_PROVIDER_SITE_OTHER): Payer: Managed Care, Other (non HMO) | Admitting: Ophthalmology

## 2018-05-03 ENCOUNTER — Encounter (INDEPENDENT_AMBULATORY_CARE_PROVIDER_SITE_OTHER): Payer: Managed Care, Other (non HMO) | Admitting: Ophthalmology

## 2018-05-10 ENCOUNTER — Encounter (INDEPENDENT_AMBULATORY_CARE_PROVIDER_SITE_OTHER): Payer: Managed Care, Other (non HMO) | Admitting: Ophthalmology

## 2018-05-10 DIAGNOSIS — E11311 Type 2 diabetes mellitus with unspecified diabetic retinopathy with macular edema: Secondary | ICD-10-CM | POA: Diagnosis not present

## 2018-05-10 DIAGNOSIS — H35033 Hypertensive retinopathy, bilateral: Secondary | ICD-10-CM

## 2018-05-10 DIAGNOSIS — E113513 Type 2 diabetes mellitus with proliferative diabetic retinopathy with macular edema, bilateral: Secondary | ICD-10-CM | POA: Diagnosis not present

## 2018-05-10 DIAGNOSIS — H43811 Vitreous degeneration, right eye: Secondary | ICD-10-CM

## 2018-05-10 DIAGNOSIS — I10 Essential (primary) hypertension: Secondary | ICD-10-CM

## 2018-06-07 ENCOUNTER — Encounter (INDEPENDENT_AMBULATORY_CARE_PROVIDER_SITE_OTHER): Payer: Managed Care, Other (non HMO) | Admitting: Ophthalmology

## 2018-06-07 DIAGNOSIS — E113513 Type 2 diabetes mellitus with proliferative diabetic retinopathy with macular edema, bilateral: Secondary | ICD-10-CM | POA: Diagnosis not present

## 2018-06-07 DIAGNOSIS — I10 Essential (primary) hypertension: Secondary | ICD-10-CM

## 2018-06-07 DIAGNOSIS — E11311 Type 2 diabetes mellitus with unspecified diabetic retinopathy with macular edema: Secondary | ICD-10-CM

## 2018-06-07 DIAGNOSIS — H43813 Vitreous degeneration, bilateral: Secondary | ICD-10-CM

## 2018-06-07 DIAGNOSIS — H35033 Hypertensive retinopathy, bilateral: Secondary | ICD-10-CM | POA: Diagnosis not present

## 2018-07-05 ENCOUNTER — Encounter (INDEPENDENT_AMBULATORY_CARE_PROVIDER_SITE_OTHER): Payer: Managed Care, Other (non HMO) | Admitting: Ophthalmology

## 2018-07-19 ENCOUNTER — Encounter (INDEPENDENT_AMBULATORY_CARE_PROVIDER_SITE_OTHER): Payer: Managed Care, Other (non HMO) | Admitting: Ophthalmology

## 2018-07-19 ENCOUNTER — Other Ambulatory Visit: Payer: Self-pay

## 2018-07-19 DIAGNOSIS — H43813 Vitreous degeneration, bilateral: Secondary | ICD-10-CM

## 2018-07-19 DIAGNOSIS — E113513 Type 2 diabetes mellitus with proliferative diabetic retinopathy with macular edema, bilateral: Secondary | ICD-10-CM | POA: Diagnosis not present

## 2018-07-19 DIAGNOSIS — H35033 Hypertensive retinopathy, bilateral: Secondary | ICD-10-CM

## 2018-07-19 DIAGNOSIS — I10 Essential (primary) hypertension: Secondary | ICD-10-CM

## 2018-07-19 DIAGNOSIS — E11311 Type 2 diabetes mellitus with unspecified diabetic retinopathy with macular edema: Secondary | ICD-10-CM

## 2018-07-19 DIAGNOSIS — H35371 Puckering of macula, right eye: Secondary | ICD-10-CM

## 2018-08-16 ENCOUNTER — Encounter (INDEPENDENT_AMBULATORY_CARE_PROVIDER_SITE_OTHER): Payer: Managed Care, Other (non HMO) | Admitting: Ophthalmology

## 2018-08-16 ENCOUNTER — Other Ambulatory Visit: Payer: Self-pay

## 2018-08-16 DIAGNOSIS — E11311 Type 2 diabetes mellitus with unspecified diabetic retinopathy with macular edema: Secondary | ICD-10-CM

## 2018-08-16 DIAGNOSIS — E113513 Type 2 diabetes mellitus with proliferative diabetic retinopathy with macular edema, bilateral: Secondary | ICD-10-CM | POA: Diagnosis not present

## 2018-08-16 DIAGNOSIS — H35033 Hypertensive retinopathy, bilateral: Secondary | ICD-10-CM

## 2018-08-16 DIAGNOSIS — H43811 Vitreous degeneration, right eye: Secondary | ICD-10-CM

## 2018-08-16 DIAGNOSIS — H35371 Puckering of macula, right eye: Secondary | ICD-10-CM

## 2018-08-16 DIAGNOSIS — I10 Essential (primary) hypertension: Secondary | ICD-10-CM

## 2018-09-16 ENCOUNTER — Telehealth: Payer: Self-pay | Admitting: Interventional Cardiology

## 2018-09-16 MED ORDER — QUINAPRIL HCL 5 MG PO TABS: 5.0000 mg | ORAL_TABLET | Freq: Every day | ORAL | 3 refills | Status: DC

## 2018-09-16 MED ORDER — ROSUVASTATIN CALCIUM 40 MG PO TABS: 40.0000 mg | ORAL_TABLET | Freq: Every day | ORAL | 3 refills | Status: DC

## 2018-09-16 MED ORDER — EZETIMIBE 10 MG PO TABS: 10.0000 mg | ORAL_TABLET | Freq: Every day | ORAL | 3 refills | Status: DC

## 2018-09-16 MED ORDER — CLOPIDOGREL BISULFATE 75 MG PO TABS: 75.0000 mg | ORAL_TABLET | Freq: Every day | ORAL | 3 refills | Status: DC

## 2018-09-16 NOTE — Telephone Encounter (Signed)
Called and spoke to patient. Patient requesting his yearly appointment. Patient wishes to be seen in the office with an EKG once COVID restrictions allow. Patient states that he would like to have his yearly stress test ordered at that time as well. Patient is completely asymptomatic and is happy to wait until next available appointment. Appt scheduled for 9/4. Refills sent in. Patient will let us know if he deveops any Sx prior to that time.

## 2018-09-16 NOTE — Telephone Encounter (Signed)
New message:    Patient calling trying to get a sooner appt with Dr. Irish Lack.please callpatient

## 2018-09-19 ENCOUNTER — Other Ambulatory Visit: Payer: Self-pay

## 2018-09-19 ENCOUNTER — Encounter (INDEPENDENT_AMBULATORY_CARE_PROVIDER_SITE_OTHER): Payer: Managed Care, Other (non HMO) | Admitting: Ophthalmology

## 2018-09-19 DIAGNOSIS — I10 Essential (primary) hypertension: Secondary | ICD-10-CM

## 2018-09-19 DIAGNOSIS — H43811 Vitreous degeneration, right eye: Secondary | ICD-10-CM

## 2018-09-19 DIAGNOSIS — H35033 Hypertensive retinopathy, bilateral: Secondary | ICD-10-CM

## 2018-09-19 DIAGNOSIS — E11311 Type 2 diabetes mellitus with unspecified diabetic retinopathy with macular edema: Secondary | ICD-10-CM | POA: Diagnosis not present

## 2018-09-19 DIAGNOSIS — E113513 Type 2 diabetes mellitus with proliferative diabetic retinopathy with macular edema, bilateral: Secondary | ICD-10-CM

## 2018-10-25 ENCOUNTER — Encounter (INDEPENDENT_AMBULATORY_CARE_PROVIDER_SITE_OTHER): Payer: Managed Care, Other (non HMO) | Admitting: Ophthalmology

## 2018-10-25 ENCOUNTER — Other Ambulatory Visit: Payer: Self-pay

## 2018-10-25 DIAGNOSIS — I10 Essential (primary) hypertension: Secondary | ICD-10-CM | POA: Diagnosis not present

## 2018-10-25 DIAGNOSIS — H43813 Vitreous degeneration, bilateral: Secondary | ICD-10-CM

## 2018-10-25 DIAGNOSIS — E113513 Type 2 diabetes mellitus with proliferative diabetic retinopathy with macular edema, bilateral: Secondary | ICD-10-CM

## 2018-10-25 DIAGNOSIS — H35033 Hypertensive retinopathy, bilateral: Secondary | ICD-10-CM

## 2018-10-25 DIAGNOSIS — E11311 Type 2 diabetes mellitus with unspecified diabetic retinopathy with macular edema: Secondary | ICD-10-CM

## 2018-10-28 ENCOUNTER — Ambulatory Visit (HOSPITAL_COMMUNITY)
Admission: RE | Admit: 2018-10-28 | Discharge: 2018-10-28 | Disposition: A | Discharge status: Home / Self Care | Payer: Managed Care, Other (non HMO) | Source: Ambulatory Visit | Attending: Cardiology | Admitting: Cardiology

## 2018-10-28 ENCOUNTER — Other Ambulatory Visit: Payer: Self-pay

## 2018-10-28 ENCOUNTER — Other Ambulatory Visit (HOSPITAL_COMMUNITY): Payer: Self-pay | Admitting: Interventional Cardiology

## 2018-10-28 DIAGNOSIS — I6523 Occlusion and stenosis of bilateral carotid arteries: Secondary | ICD-10-CM

## 2018-11-29 ENCOUNTER — Encounter (INDEPENDENT_AMBULATORY_CARE_PROVIDER_SITE_OTHER): Payer: Medicare Other | Admitting: Ophthalmology

## 2018-11-29 ENCOUNTER — Other Ambulatory Visit: Payer: Self-pay

## 2018-11-29 DIAGNOSIS — H43811 Vitreous degeneration, right eye: Secondary | ICD-10-CM

## 2018-11-29 DIAGNOSIS — H35033 Hypertensive retinopathy, bilateral: Secondary | ICD-10-CM

## 2018-11-29 DIAGNOSIS — E11311 Type 2 diabetes mellitus with unspecified diabetic retinopathy with macular edema: Secondary | ICD-10-CM

## 2018-11-29 DIAGNOSIS — I10 Essential (primary) hypertension: Secondary | ICD-10-CM | POA: Diagnosis not present

## 2018-11-29 DIAGNOSIS — E113513 Type 2 diabetes mellitus with proliferative diabetic retinopathy with macular edema, bilateral: Secondary | ICD-10-CM

## 2018-12-05 NOTE — Progress Notes (Deleted)
Cardiology Office Note   Date:  12/05/2018   ID:  Jeremy Johnston, DOB 1950-09-05, MRN [id number]  PCP:  Ivan Anchors, MD    No chief complaint on file.  CAD  Wt Readings from Last 3 Encounters:  02/11/18 167 lb (75.8 kg)  10/23/17 160 lb 12 oz (72.9 kg)  05/04/17 165 lb (74.8 kg)       History of Present Illness: Jeremy Johnston is a 68 y.o. male  with history of CAD status post CABG in 2008 at which time he was asymptomatic and routine EKG prompted stress testing that led to CABG.  Stress test in 2011 showed small inferior defect and ETT in 2014, 2016, 2017, 11/2016 normal .   He also has diabetes mellitus, carotid disease, HLD.  Last saw Dr. Irish Lack 10/2016 at which time LDL was 110 and Zetia was added to Crestor.  Recommended considering PCSK9 inhibitor if we could not get him to target.  Had some eye hemorrhage in the past so aspirin was stopped but Plavix was continued. Patient worked for [employer].  Past Medical History:  Diagnosis Date  . Cancer ([place])    kidney cancer-both kidneys remain  . Chronic kidney disease    Robotic Kidney surgery for kidney cancer tumor-kidney remains-  . Coronary artery disease   . Diabetes mellitus (HCC)    Insulin pump -[name], and has a glucose transmitter attached.  . Diabetic retinopathy ([place])    left eye  . Hyperlipidemia   . Hypertension   . Sciatica   . Vitreous hemorrhage (HCC)    left eye    Past Surgical History:  Procedure Laterality Date  . CATARACT EXTRACTION, BILATERAL Bilateral    11'15  . CORONARY ARTERY BYPASS GRAFT  10/08   x4 vessels([place])  . DIAGNOSTIC LAPAROSCOPY    . EYE SURGERY     retina peele  . HERNIA REPAIR    . KIDNEY SURGERY     tumor removed  . PARS PLANA VITRECTOMY 27 GAUGE Left 12/12/2016   Procedure: PARS PLANA VITRECTOMY 80 GAUGE , ENDOLASER GAS/ FLUID EXCHAGNE;  Surgeon: Hayden Pedro, MD;  Location: [facility];  Service: Ophthalmology;  Laterality: Left;  Marland Kitchen VASECTOMY       Current  Outpatient Medications  Medication Sig Dispense Refill  . aspirin EC 325 MG tablet Take 650 mg by mouth at bedtime as needed for moderate pain.    Marland Kitchen Besifloxacin HCl (BESIVANCE OP) Place 1 drop into the left eye 4 (four) times daily. The day of and 3-4 days after eye injection every 6 weeks    . brimonidine (ALPHAGAN) 0.15 % ophthalmic solution Place 1 drop into the right eye 2 (two) times daily.  11  . clopidogrel (PLAVIX) 75 MG tablet Take 1 tablet (75 mg total) by mouth daily with breakfast. 90 tablet 3  . dorzolamide-timolol (COSOPT) 22.3-6.8 MG/ML ophthalmic solution Place 1 drop into the right eye 2 (two) times daily.    Marland Kitchen ezetimibe (ZETIA) 10 MG tablet Take 1 tablet (10 mg total) by mouth daily. 90 tablet 3  . famotidine (PEPCID) 20 MG tablet Take 20 mg by mouth daily as needed for heartburn or indigestion.    . Insulin Human (INSULIN PUMP) 100 unit/ml SOLN Inject into the skin. Use with insulin as directed    . loratadine (CLARITIN) 10 MG tablet Take 10 mg by mouth daily as needed for allergies.    . Multiple Vitamin (MULTIVITAMIN WITH MINERALS) TABS tablet Take  1 tablet by mouth 2 (two) times a week.     Marland Kitchen NOVOLOG 100 UNIT/ML injection Inject 1 Units into the skin continuous. Basal rate 1 unit per hour, may vary.  Bolus 1 unit per 10g of carbs    . quinapril (ACCUPRIL) 5 MG tablet Take 1 tablet (5 mg total) by mouth daily. 90 tablet 3  . rosuvastatin (CRESTOR) 40 MG tablet Take 1 tablet (40 mg total) by mouth at bedtime. 90 tablet 3   No current facility-administered medications for this visit.     Allergies:   Patient has no known allergies.    Social History:  The patient  reports that he has never smoked. He has never used smokeless tobacco. He reports that he does not drink alcohol or use drugs.   Family History:  The patient's ***family history includes Diabetes in his sister; Heart attack in his father and sister.    ROS:  Please see the history of present illness.    Otherwise, review of systems are positive for ***.   All other systems are reviewed and negative.    PHYSICAL EXAM: VS:  There were no vitals taken for this visit. , BMI There is no height or weight on file to calculate BMI. GEN: Well nourished, well developed, in no acute distress  HEENT: normal  Neck: no JVD, carotid bruits, or masses Cardiac: ***RRR; no murmurs, rubs, or gallops,no edema  Respiratory:  clear to auscultation bilaterally, normal work of breathing GI: soft, nontender, nondistended, + BS MS: no deformity or atrophy  Skin: warm and dry, no rash Neuro:  Strength and sensation are intact Psych: euthymic mood, full affect   EKG:   The ekg ordered today demonstrates ***   Recent Labs: No results found for requested labs within last 8760 hours.   Lipid Panel    Component Value Date/Time   CHOL 127 10/25/2017 0842   TRIG 58 10/25/2017 0842   HDL 40 10/25/2017 0842   CHOLHDL 3.2 10/25/2017 0842   LDLCALC 75 10/25/2017 0842     Other studies Reviewed: Additional studies/ records that were reviewed today with results demonstrating: ***.   ASSESSMENT AND PLAN:  1. CAD 2. HTN: 3. Carotid artery stenosis 4. Mixed hyperlipidemia: 5. DM:   Current medicines are reviewed at length with the patient today.  The patient concerns regarding his medicines were addressed.  The following changes have been made:  No change***  Labs/ tests ordered today include: *** No orders of the defined types were placed in this encounter.   Recommend 150 minutes/week of aerobic exercise Low fat, low carb, high fiber diet recommended  Disposition:   FU in ***   Signed, Larae Grooms, MD  12/05/2018 2:19 PM    [facility] [place], [place], [place]  [address] Phone: [phone]; Fax: [phone]

## 2018-12-06 ENCOUNTER — Ambulatory Visit: Payer: Managed Care, Other (non HMO) | Admitting: Interventional Cardiology

## 2018-12-11 NOTE — Progress Notes (Signed)
Cardiology Office Note   Date:  12/12/2018   ID:  Jeremy Johnston, DOB March 17, 1951, MRN [id number]  PCP:  Ivan Anchors, MD    No chief complaint on file.  CAD  Wt Readings from Last 3 Encounters:  12/12/18 166 lb 6.4 oz (75.5 kg)  02/11/18 167 lb (75.8 kg)  10/23/17 160 lb 12 oz (72.9 kg)       History of Present Illness: Jeremy Johnston is a 68 y.o. male  who has had DM and CAD. DM diagnosed in 1986.He had CABG in 2008. He was essentially asypmtomatic at the time of his CABG.Change on routine ECG in 2008 prompted stress testing and w/u that led to CABG.    He had a stress test in 2011 with a small inferior defect. He had a ETT in 2014 which was unremarkable per his report. He is alternating nuclear tests and ETT on an annual basis.   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 He had an ETT (9 min on treadmill) in 6/16 which was low risk. 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  Normal ETT in 8/17.   Denies :  Dizziness. Leg edema. Nitroglycerin use. Orthopnea. Palpitations. Paroxysmal nocturnal dyspnea. Shortness of breath. Syncope.   Has some left sided chest pain.  It can be worse with physical activity with his left arm, or after sleeping on his left side.  Feels like what he had after surgery in 2008.    Past Medical History:  Diagnosis Date  . Cancer ([place])    kidney cancer-both kidneys remain  . Chronic kidney disease    Robotic Kidney surgery for kidney cancer tumor-kidney remains-  . Coronary artery disease   . Diabetes mellitus (HCC)    Insulin pump -[name], and has a glucose transmitter attached.  . Diabetic retinopathy ([place])    left eye  . Hyperlipidemia   . Hypertension   . Sciatica   . Vitreous hemorrhage (HCC)    left eye    Past Surgical History:  Procedure Laterality Date  . CATARACT EXTRACTION, BILATERAL Bilateral    11'15  . CORONARY ARTERY BYPASS GRAFT   10/08   x4 vessels([place])  . DIAGNOSTIC LAPAROSCOPY    . EYE SURGERY     retina peele  . HERNIA REPAIR    . KIDNEY SURGERY     tumor removed  . PARS PLANA VITRECTOMY 27 GAUGE Left 12/12/2016   Procedure: PARS PLANA VITRECTOMY 32 GAUGE , ENDOLASER GAS/ FLUID EXCHAGNE;  Surgeon: Hayden Pedro, MD;  Location: [facility];  Service: Ophthalmology;  Laterality: Left;  Marland Kitchen VASECTOMY       Current Outpatient Medications  Medication Sig Dispense Refill  . Besifloxacin HCl (BESIVANCE OP) Place 1 drop into the left eye 4 (four) times daily. The day of and 3-4 days after eye injection every 6 weeks    . brimonidine (ALPHAGAN) 0.15 % ophthalmic solution Place 1 drop into the right eye 2 (two) times daily.  11  . clopidogrel (PLAVIX) 75 MG tablet Take 1 tablet (75 mg total) by mouth daily with breakfast. 90 tablet 3  . dorzolamide-timolol (COSOPT) 22.3-6.8 MG/ML ophthalmic solution Place 1 drop into the right eye 2 (two) times daily.    Marland Kitchen ezetimibe (ZETIA) 10 MG tablet Take 1 tablet (10 mg total) by mouth daily. 90 tablet 3  . famotidine (PEPCID) 20 MG tablet Take  20 mg by mouth daily as needed for heartburn or indigestion.    . Insulin Human (INSULIN PUMP) 100 unit/ml SOLN Inject into the skin. Use with insulin as directed    . loratadine (CLARITIN) 10 MG tablet Take 10 mg by mouth daily as needed for allergies.    . Multiple Vitamin (MULTIVITAMIN WITH MINERALS) TABS tablet Take 1 tablet by mouth 2 (two) times a week.     Marland Kitchen NOVOLOG 100 UNIT/ML injection Inject 1 Units into the skin continuous. Basal rate 1 unit per hour, may vary.  Bolus 1 unit per 10g of carbs    . quinapril (ACCUPRIL) 5 MG tablet Take 1 tablet (5 mg total) by mouth daily. 90 tablet 3  . rosuvastatin (CRESTOR) 40 MG tablet Take 1 tablet (40 mg total) by mouth at bedtime. 90 tablet 3   No current facility-administered medications for this visit.     Allergies:   Patient has no known allergies.    Social History:  The patient   reports that he has never smoked. He has never used smokeless tobacco. He reports that he does not drink alcohol or use drugs.   Family History:  The patient's family history includes Diabetes in his sister; Heart attack in his father and sister.    ROS:  Please see the history of present illness.   Otherwise, review of systems are positive for chest pain.   All other systems are reviewed and negative.    PHYSICAL EXAM: VS:  BP 112/62   Pulse 64   Ht 5\' 7"  (1.702 m)   Wt 166 lb 6.4 oz (75.5 kg)   SpO2 95%   BMI 26.06 kg/m  , BMI Body mass index is 26.06 kg/m. GEN: Well nourished, well developed, in no acute distress  HEENT: normal  Neck: no JVD, carotid bruits, or masses Cardiac: RRR; no murmurs, rubs, or gallops,no edema  Respiratory:  clear to auscultation bilaterally, normal work of breathing GI: soft, nontender, nondistended, + BS MS: no deformity or atrophy  Skin: warm and dry, no rash Neuro:  Strength and sensation are intact Psych: euthymic mood, full affect   EKG:   The ekg ordered today demonstrates NSR, inferior Q waves, nonspecific ST changes laterally- no change from prior   Recent Labs: No results found for requested labs within last 8760 hours.   Lipid Panel    Component Value Date/Time   CHOL 127 10/25/2017 0842   TRIG 58 10/25/2017 0842   HDL 40 10/25/2017 0842   CHOLHDL 3.2 10/25/2017 0842   LDLCALC 75 10/25/2017 0842     Other studies Reviewed: Additional studies/ records that were reviewed today with results demonstrating: 2019 GXT normal.   ASSESSMENT AND PLAN:  1. CAD: Chest pain with some atypical features.  Plan for myoview.  2. DM: Insulin pump. A1C 6.9 in 10/2018 3. Hyperlipidemia:LDL 72 in 10/2018.  4. Hypertensive heart disease: The current medical regimen is effective;  continue present plan and medications.  DOing ok with less medicine compared to prior.  5. CArotid disease: Moderate disease- repeat in 10/2019.   Current medicines  are reviewed at length with the patient today.  The patient concerns regarding his medicines were addressed.  The following changes have been made:  No change  Labs/ tests ordered today include: myoview No orders of the defined types were placed in this encounter.   Recommend 150 minutes/week of aerobic exercise Low fat, low carb, high fiber diet recommended  Disposition:  FU in for stress test, 1 year   Signed, Larae Grooms, MD  12/12/2018 9:36 AM    [facility] [place], [place], [place]  [address] Phone: [phone]; Fax: [phone]

## 2018-12-12 ENCOUNTER — Ambulatory Visit (INDEPENDENT_AMBULATORY_CARE_PROVIDER_SITE_OTHER): Payer: Medicare Other | Admitting: Interventional Cardiology

## 2018-12-12 ENCOUNTER — Encounter: Payer: Self-pay | Admitting: Interventional Cardiology

## 2018-12-12 ENCOUNTER — Other Ambulatory Visit: Payer: Self-pay

## 2018-12-12 VITALS — BP 112/62 | HR 64 | Ht 67.0 in | Wt 166.4 lb

## 2018-12-12 DIAGNOSIS — E1159 Type 2 diabetes mellitus with other circulatory complications: Secondary | ICD-10-CM

## 2018-12-12 DIAGNOSIS — I6523 Occlusion and stenosis of bilateral carotid arteries: Secondary | ICD-10-CM

## 2018-12-12 DIAGNOSIS — E782 Mixed hyperlipidemia: Secondary | ICD-10-CM

## 2018-12-12 DIAGNOSIS — I10 Essential (primary) hypertension: Secondary | ICD-10-CM

## 2018-12-12 DIAGNOSIS — I25119 Atherosclerotic heart disease of native coronary artery with unspecified angina pectoris: Secondary | ICD-10-CM | POA: Diagnosis not present

## 2018-12-12 MED ORDER — ROSUVASTATIN CALCIUM 40 MG PO TABS: 40.0000 mg | ORAL_TABLET | Freq: Every day | ORAL | 3 refills | Status: DC

## 2018-12-12 MED ORDER — QUINAPRIL HCL 5 MG PO TABS: 5.0000 mg | ORAL_TABLET | Freq: Every day | ORAL | 3 refills | Status: DC

## 2018-12-12 MED ORDER — EZETIMIBE 10 MG PO TABS: 10.0000 mg | ORAL_TABLET | Freq: Every day | ORAL | 3 refills | Status: DC

## 2018-12-12 MED ORDER — CLOPIDOGREL BISULFATE 75 MG PO TABS: 75.0000 mg | ORAL_TABLET | Freq: Every day | ORAL | 3 refills | Status: DC

## 2018-12-12 NOTE — Patient Instructions (Signed)
Medication Instructions:  Your physician recommends that you continue on your current medications as directed. Please refer to the Current Medication list given to you today.  If you need a refill on your cardiac medications before your next appointment, please call your pharmacy.   Lab work: None Ordered  If you have labs (blood work) drawn today and your tests are completely normal, you will receive your results only by: Marland Kitchen MyChart Message (if you have MyChart) OR . A paper copy in the mail If you have any lab test that is abnormal or we need to change your treatment, we will call you to review the results.  Testing/Procedures: Your physician has requested that you have en exercise stress myoview. For further information please visit [email or web address]. Please follow instruction sheet, as given.  Follow-Up: At [facility], you and your health needs are our priority.  As part of our continuing mission to provide you with exceptional heart care, we have created designated Provider Care Teams.  These Care Teams include your primary Cardiologist (physician) and Advanced Practice Providers (APPs -  Physician Assistants and Nurse Practitioners) who all work together to provide you with the care you need, when you need it. . You will need a follow up appointment in 1 year.  Please call our office 2 months in advance to schedule this appointment.  You may see Casandra Doffing, MD or one of the following Advanced Practice Providers on your designated Care Team:   . Lyda Jester, PA-C . Dayna Dunn, PA-C . Ermalinda Barrios, PA-C  Any Other Special Instructions Will Be Listed Below (If Applicable).

## 2018-12-27 ENCOUNTER — Encounter (INDEPENDENT_AMBULATORY_CARE_PROVIDER_SITE_OTHER): Payer: Medicare Other | Admitting: Ophthalmology

## 2018-12-27 ENCOUNTER — Other Ambulatory Visit: Payer: Self-pay

## 2018-12-27 DIAGNOSIS — E11311 Type 2 diabetes mellitus with unspecified diabetic retinopathy with macular edema: Secondary | ICD-10-CM | POA: Diagnosis not present

## 2018-12-27 DIAGNOSIS — E113513 Type 2 diabetes mellitus with proliferative diabetic retinopathy with macular edema, bilateral: Secondary | ICD-10-CM | POA: Diagnosis not present

## 2018-12-27 DIAGNOSIS — H43811 Vitreous degeneration, right eye: Secondary | ICD-10-CM

## 2018-12-27 DIAGNOSIS — H35033 Hypertensive retinopathy, bilateral: Secondary | ICD-10-CM | POA: Diagnosis not present

## 2018-12-27 DIAGNOSIS — I10 Essential (primary) hypertension: Secondary | ICD-10-CM | POA: Diagnosis not present

## 2019-01-09 ENCOUNTER — Telehealth (HOSPITAL_COMMUNITY): Payer: Self-pay | Admitting: *Deleted

## 2019-01-09 ENCOUNTER — Other Ambulatory Visit (HOSPITAL_COMMUNITY)
Admission: RE | Admit: 2019-01-09 | Discharge: 2019-01-09 | Disposition: A | Discharge status: Home / Self Care | Payer: Medicare Other | Source: Ambulatory Visit | Attending: Interventional Cardiology | Admitting: Interventional Cardiology

## 2019-01-09 DIAGNOSIS — Z20828 Contact with and (suspected) exposure to other viral communicable diseases: Secondary | ICD-10-CM | POA: Diagnosis not present

## 2019-01-09 DIAGNOSIS — Z01812 Encounter for preprocedural laboratory examination: Secondary | ICD-10-CM | POA: Diagnosis present

## 2019-01-09 NOTE — Telephone Encounter (Signed)
Patient given detailed instructions per Myocardial Perfusion Study Information Sheet for the test on 01/13/19 at 8:15. Patient notified to arrive 15 minutes early and that it is imperative to arrive on time for appointment to keep from having the test rescheduled.  If you need to cancel or reschedule your appointment, please call the office within 24 hours of your appointment. . Patient verbalized understanding.Jeremy Johnston

## 2019-01-10 LAB — NOVEL CORONAVIRUS, NAA (HOSP ORDER, SEND-OUT TO REF LAB; TAT 18-24 HRS): SARS-CoV-2, NAA: NOT DETECTED

## 2019-01-13 ENCOUNTER — Ambulatory Visit (HOSPITAL_COMMUNITY): Payer: Medicare Other | Attending: Cardiovascular Disease

## 2019-01-13 ENCOUNTER — Other Ambulatory Visit: Payer: Self-pay

## 2019-01-13 DIAGNOSIS — I25119 Atherosclerotic heart disease of native coronary artery with unspecified angina pectoris: Secondary | ICD-10-CM | POA: Insufficient documentation

## 2019-01-13 LAB — MYOCARDIAL PERFUSION IMAGING
Estimated workload: 10.1 METS
Exercise duration (min): 9 min
Exercise duration (sec): 15 s
LV dias vol: 64 mL (ref 62–150)
LV sys vol: 26 mL
MPHR: 153 {beats}/min
Peak HR: 134 {beats}/min
Percent HR: 87 %
Rest HR: 85 {beats}/min
SDS: 2
SRS: 5
SSS: 8
TID: 0.65

## 2019-01-13 MED ORDER — TECHNETIUM TC 99M TETROFOSMIN IV KIT
32.9000 | PACK | Freq: Once | INTRAVENOUS | Status: AC | PRN
  Administered 2019-01-13: 32.9 via INTRAVENOUS
  Filled 2019-01-13: qty 33

## 2019-01-13 MED ORDER — TECHNETIUM TC 99M TETROFOSMIN IV KIT
10.1000 | PACK | Freq: Once | INTRAVENOUS | Status: AC | PRN
  Administered 2019-01-13: 10.1 via INTRAVENOUS
  Filled 2019-01-13: qty 11

## 2019-01-22 ENCOUNTER — Encounter (INDEPENDENT_AMBULATORY_CARE_PROVIDER_SITE_OTHER): Payer: Medicare Other | Admitting: Ophthalmology

## 2019-01-31 ENCOUNTER — Encounter (INDEPENDENT_AMBULATORY_CARE_PROVIDER_SITE_OTHER): Payer: Medicare Other | Admitting: Ophthalmology

## 2019-01-31 DIAGNOSIS — H35033 Hypertensive retinopathy, bilateral: Secondary | ICD-10-CM | POA: Diagnosis not present

## 2019-01-31 DIAGNOSIS — I10 Essential (primary) hypertension: Secondary | ICD-10-CM | POA: Diagnosis not present

## 2019-01-31 DIAGNOSIS — H43813 Vitreous degeneration, bilateral: Secondary | ICD-10-CM

## 2019-01-31 DIAGNOSIS — E113513 Type 2 diabetes mellitus with proliferative diabetic retinopathy with macular edema, bilateral: Secondary | ICD-10-CM | POA: Diagnosis not present

## 2019-01-31 DIAGNOSIS — E11311 Type 2 diabetes mellitus with unspecified diabetic retinopathy with macular edema: Secondary | ICD-10-CM

## 2019-02-07 ENCOUNTER — Encounter: Payer: Self-pay | Admitting: Sports Medicine

## 2019-02-07 ENCOUNTER — Ambulatory Visit (INDEPENDENT_AMBULATORY_CARE_PROVIDER_SITE_OTHER): Payer: Medicare Other | Admitting: Sports Medicine

## 2019-02-07 ENCOUNTER — Other Ambulatory Visit: Payer: Self-pay

## 2019-02-07 DIAGNOSIS — I6523 Occlusion and stenosis of bilateral carotid arteries: Secondary | ICD-10-CM | POA: Diagnosis not present

## 2019-02-07 DIAGNOSIS — M25521 Pain in right elbow: Secondary | ICD-10-CM | POA: Diagnosis not present

## 2019-02-07 DIAGNOSIS — S46211A Strain of muscle, fascia and tendon of other parts of biceps, right arm, initial encounter: Secondary | ICD-10-CM

## 2019-02-07 NOTE — Progress Notes (Signed)
Subjective:    CC: Right elbow injury  HPI:  Jeremy Johnston is a pleasant 68 year old male avid Jeremy Johnston, recently he was bowling and felt a pull in his right elbow, his fingers got stuck on the ball.  He had moderate pain which resolved over the next several days, he went bowling again, and had a similar issue but this time with resultant large amount of bruising.  Now he has mild pain over the anterior right elbow, he has some weakness to supination, mild, persistent.  I reviewed the past medical history, family history, social history, surgical history, and allergies today and no changes were needed.  Please see the problem list section below in epic for further details.  Past Medical History: Past Medical History:  Diagnosis Date  . Cancer ([place])    kidney cancer-both kidneys remain  . Chronic kidney disease    Robotic Kidney surgery for kidney cancer tumor-kidney remains-  . Coronary artery disease   . Diabetes mellitus (HCC)    Insulin pump -Jeremy Johnston, and has a glucose transmitter attached.  . Diabetic retinopathy ([place])    left eye  . Hyperlipidemia   . Hypertension   . Sciatica   . Vitreous hemorrhage (HCC)    left eye   Past Surgical History: Past Surgical History:  Procedure Laterality Date  . CATARACT EXTRACTION, BILATERAL Bilateral    11'15  . CORONARY ARTERY BYPASS GRAFT  10/08   x4 vessels([place])  . DIAGNOSTIC LAPAROSCOPY    . EYE SURGERY     retina peele  . HERNIA REPAIR    . KIDNEY SURGERY     tumor removed  . PARS PLANA VITRECTOMY 27 GAUGE Left 12/12/2016   Procedure: PARS PLANA VITRECTOMY 66 GAUGE , ENDOLASER GAS/ FLUID EXCHAGNE;  Surgeon: Jeremy Pedro, MD;  Location: [facility];  Service: Ophthalmology;  Laterality: Left;  Jeremy Johnston Kitchen VASECTOMY     Social History: Social History   Socioeconomic History  . Marital status: Married    Spouse name: Not on file  . Number of children: Not on file  . Years of education: Not on file  . Highest education level: Not on file   Occupational History  . Not on file  Social Needs  . Financial resource strain: Not on file  . Food insecurity    Worry: Not on file    Inability: Not on file  . Transportation needs    Medical: Not on file    Non-medical: Not on file  Tobacco Use  . Smoking status: Never Smoker  . Smokeless tobacco: Never Used  Substance and Sexual Activity  . Alcohol use: No  . Drug use: No  . Sexual activity: Not on file  Lifestyle  . Physical activity    Days per week: Not on file    Minutes per session: Not on file  . Stress: Not on file  Relationships  . Social Herbalist on phone: Not on file    Gets together: Not on file    Attends religious service: Not on file    Active member of club or organization: Not on file    Attends meetings of clubs or organizations: Not on file    Relationship status: Not on file  Other Topics Concern  . Not on file  Social History Narrative  . Not on file   Family History: Family History  Problem Relation Age of Onset  . Heart attack Father   . Heart attack Sister   .  Diabetes Sister    Allergies: No Known Allergies Medications: See med rec.  Review of Systems: No headache, visual changes, nausea, vomiting, diarrhea, constipation, dizziness, abdominal pain, skin rash, fevers, chills, night sweats, swollen lymph nodes, weight loss, chest pain, body aches, joint swelling, muscle aches, shortness of breath, mood changes, visual or auditory hallucinations.  Objective:    General: Well Developed, well nourished, and in no acute distress.  Neuro: Alert and oriented x3, extra-ocular muscles intact, sensation grossly intact.  HEENT: Normocephalic, atraumatic, pupils equal round reactive to light, neck supple, no masses, no lymphadenopathy, thyroid nonpalpable.  Skin: Warm and dry, no rashes noted.  Cardiac: Regular rate and rhythm, no murmurs rubs or gallops.  Respiratory: Clear to auscultation bilaterally. Not using accessory muscles,  speaking in full sentences.  Abdominal: Soft, nontender, nondistended, positive bowel sounds, no masses, no organomegaly.  Right elbow: Swollen with a palpable hematoma over the distal biceps anteriorly Range of motion full pronation, supination, flexion, extension. Strength is good in all directions with the exception of supination in which she has 4 -/5 strength.  This is highly consistent with a tear of the distal biceps from its insertion at the radial tuberosity. Stable to varus, valgus stress. Negative moving valgus stress test. No discrete areas of tenderness to palpation. Ulnar nerve does not sublux. Negative cubital tunnel Tinel's.  Impression and Recommendations:    The patient was counselled, risk factors were discussed, anticipatory guidance given.  Right distal biceps tendon tear I think there has been an acute tear of the right distal biceps tendon when bowling. X-rays, MRI on Sunday. He really is not having a whole lot of pain but he does have significant weakness to supination of the right forearm. Adding some formal physical therapy. Continue compression sleeve. Return to see me to go over MRI results. Even if he does have a full tear I do suspect we will be able to rehab through it and he will be able to continue bowling without surgery.   ___________________________________________ Jeremy Johnston. Jeremy Johnston, M.D., ABFM., CAQSM. Primary Care and Sports Medicine [facility]  Adjunct Professor of [employer]

## 2019-02-07 NOTE — Assessment & Plan Note (Addendum)
I think there has been an acute tear of the right distal biceps tendon when bowling. X-rays, MRI on Sunday. He really is not having a whole lot of pain but he does have significant weakness to supination of the right forearm. Adding some formal physical therapy. Continue compression sleeve. Return to see me to go over MRI results. Even if he does have a full tear I do suspect we will be able to rehab through it and he will be able to continue bowling without surgery.

## 2019-02-09 ENCOUNTER — Ambulatory Visit (INDEPENDENT_AMBULATORY_CARE_PROVIDER_SITE_OTHER): Payer: Medicare Other

## 2019-02-09 ENCOUNTER — Other Ambulatory Visit: Payer: Self-pay

## 2019-02-09 DIAGNOSIS — M25521 Pain in right elbow: Secondary | ICD-10-CM | POA: Diagnosis not present

## 2019-02-09 DIAGNOSIS — S46211A Strain of muscle, fascia and tendon of other parts of biceps, right arm, initial encounter: Secondary | ICD-10-CM

## 2019-02-10 NOTE — Progress Notes (Signed)
Pt has seen results on MyChart. I also called patient and he wants to go over next steps. Follow up scheduled for 02/11/2019.

## 2019-02-11 ENCOUNTER — Encounter: Payer: Self-pay | Admitting: Sports Medicine

## 2019-02-11 ENCOUNTER — Other Ambulatory Visit: Payer: Self-pay

## 2019-02-11 ENCOUNTER — Ambulatory Visit (INDEPENDENT_AMBULATORY_CARE_PROVIDER_SITE_OTHER): Payer: Medicare Other | Admitting: Sports Medicine

## 2019-02-11 DIAGNOSIS — S46211D Strain of muscle, fascia and tendon of other parts of biceps, right arm, subsequent encounter: Secondary | ICD-10-CM

## 2019-02-11 DIAGNOSIS — I6523 Occlusion and stenosis of bilateral carotid arteries: Secondary | ICD-10-CM | POA: Diagnosis not present

## 2019-02-11 NOTE — Assessment & Plan Note (Signed)
MRI confirms complete rupture of the distal biceps,He still has some hematoma. Otherwise no pain, good function, only a bit of weakness to supination which we will try to rehab through. Follow-up in 1 month.

## 2019-02-11 NOTE — Progress Notes (Signed)
Subjective:    CC: Right elbow injury  HPI: Jeremy Johnston is a pleasant 68 year old male, he recalls bowling, his finger stuck in the bowling ball and it forced his wrist into pronation as he was trying to actively supinate.  He felt immediate pain, and subsequently ended up with bruising and weakness.  We suspected distal biceps tear, MRI confirmed the above.  Overall he is doing well, he has no pain, he has full function.  I reviewed the past medical history, family history, social history, surgical history, and allergies today and no changes were needed.  Please see the problem list section below in epic for further details.  Past Medical History: Past Medical History:  Diagnosis Date  . Cancer ([place])    kidney cancer-both kidneys remain  . Chronic kidney disease    Robotic Kidney surgery for kidney cancer tumor-kidney remains-  . Coronary artery disease   . Diabetes mellitus (HCC)    Insulin pump -[name], and has a glucose transmitter attached.  . Diabetic retinopathy ([place])    left eye  . Hyperlipidemia   . Hypertension   . Sciatica   . Vitreous hemorrhage (HCC)    left eye   Past Surgical History: Past Surgical History:  Procedure Laterality Date  . CATARACT EXTRACTION, BILATERAL Bilateral    11'15  . CORONARY ARTERY BYPASS GRAFT  10/08   x4 vessels([place])  . DIAGNOSTIC LAPAROSCOPY    . EYE SURGERY     retina peele  . HERNIA REPAIR    . KIDNEY SURGERY     tumor removed  . PARS PLANA VITRECTOMY 27 GAUGE Left 12/12/2016   Procedure: PARS PLANA VITRECTOMY 44 GAUGE , ENDOLASER GAS/ FLUID EXCHAGNE;  Surgeon: Hayden Pedro, MD;  Location: [facility];  Service: Ophthalmology;  Laterality: Left;  Marland Kitchen VASECTOMY     Social History: Social History   Socioeconomic History  . Marital status: Married    Spouse name: Not on file  . Number of children: Not on file  . Years of education: Not on file  . Highest education level: Not on file  Occupational History  . Not on file  Social  Needs  . Financial resource strain: Not on file  . Food insecurity    Worry: Not on file    Inability: Not on file  . Transportation needs    Medical: Not on file    Non-medical: Not on file  Tobacco Use  . Smoking status: Never Smoker  . Smokeless tobacco: Never Used  Substance and Sexual Activity  . Alcohol use: No  . Drug use: No  . Sexual activity: Not on file  Lifestyle  . Physical activity    Days per week: Not on file    Minutes per session: Not on file  . Stress: Not on file  Relationships  . Social Herbalist on phone: Not on file    Gets together: Not on file    Attends religious service: Not on file    Active member of club or organization: Not on file    Attends meetings of clubs or organizations: Not on file    Relationship status: Not on file  Other Topics Concern  . Not on file  Social History Narrative  . Not on file   Family History: Family History  Problem Relation Age of Onset  . Heart attack Father   . Heart attack Sister   . Diabetes Sister    Allergies: No Known Allergies Medications:  See med rec.  Review of Systems: No fevers, chills, night sweats, weight loss, chest pain, or shortness of breath.   Objective:    General: Well Developed, well nourished, and in no acute distress.  Neuro: Alert and oriented x3, extra-ocular muscles intact, sensation grossly intact.  HEENT: Normocephalic, atraumatic, pupils equal round reactive to light, neck supple, no masses, no lymphadenopathy, thyroid nonpalpable.  Skin: Warm and dry, no rashes. Cardiac: Regular rate and rhythm, no murmurs rubs or gallops, no lower extremity edema.  Respiratory: Clear to auscultation bilaterally. Not using accessory muscles, speaking in full sentences.  MRI confirms full-thickness retracted distal biceps tear with surrounding hematoma.  Impression and Recommendations:    Right distal biceps tendon tear MRI confirms complete rupture of the distal biceps,He  still has some hematoma. Otherwise no pain, good function, only a bit of weakness to supination which we will try to rehab through. Follow-up in 1 month.   ___________________________________________ Gwen Her. Dianah Field, M.D., ABFM., CAQSM. Primary Care and Sports Medicine [facility] [employer]  Adjunct Professor of [employer] [facility] [employer]

## 2019-02-13 ENCOUNTER — Other Ambulatory Visit: Payer: Self-pay

## 2019-02-13 ENCOUNTER — Ambulatory Visit (INDEPENDENT_AMBULATORY_CARE_PROVIDER_SITE_OTHER): Payer: Medicare Other | Admitting: Rehabilitative and Restorative Service Providers"

## 2019-02-13 DIAGNOSIS — M25521 Pain in right elbow: Secondary | ICD-10-CM

## 2019-02-13 DIAGNOSIS — M6281 Muscle weakness (generalized): Secondary | ICD-10-CM

## 2019-02-13 NOTE — Patient Instructions (Signed)
Access Code: [email or web address]  URL: [email or web address]  Date: 02/13/2019  Prepared by: Rudell Cobb   Exercises Standing Elbow Flexion Extension AROM - 10 reps - 3 sets - 2x daily - 7x weekly Wrist Flexion with Dumbbell - 10 reps - 3 sets - 2x daily - 7x weekly Wrist Extension with Dumbbell - 10 reps - 3 sets - 2x daily - 7x weekly Standing Forearm Pronation and Supination AROM - 10 reps - 3 sets - 2x daily - 7x weekly Patient Education Ice Massage

## 2019-02-13 NOTE — Therapy (Signed)
[facility] [place], [place], [address] Phone: [phone]   Fax:  [phone]  Physical Therapy Evaluation  Patient Details  Name: Jeremy Johnston MRN: [id number] Date of Birth: 09-Aug-1950 Referring Provider (PT): Silverio Decamp, MD   Encounter Date: 02/13/2019  PT End of Session - 02/13/19 1748    Visit Number  1    Number of Visits  12    Date for PT Re-Evaluation  04/14/19    Authorization Type  medicare and generic supplement    PT Start Time  [id number]    PT Stop Time  1615    PT Time Calculation (min)  42 min       Past Medical History:  Diagnosis Date  . Cancer ([place])    kidney cancer-both kidneys remain  . Chronic kidney disease    Robotic Kidney surgery for kidney cancer tumor-kidney remains-  . Coronary artery disease   . Diabetes mellitus (HCC)    Insulin pump -[name], and has a glucose transmitter attached.  . Diabetic retinopathy ([employer])    left eye  . Hyperlipidemia   . Hypertension   . Sciatica   . Vitreous hemorrhage (HCC)    left eye    Past Surgical History:  Procedure Laterality Date  . CATARACT EXTRACTION, BILATERAL Bilateral    11'15  . CORONARY ARTERY BYPASS GRAFT  10/08   x4 vessels([place])  . DIAGNOSTIC LAPAROSCOPY    . EYE SURGERY     retina peele  . HERNIA REPAIR    . KIDNEY SURGERY     tumor removed  . PARS PLANA VITRECTOMY 27 GAUGE Left 12/12/2016   Procedure: PARS PLANA VITRECTOMY 65 GAUGE , ENDOLASER GAS/ FLUID EXCHAGNE;  Surgeon: Hayden Pedro, MD;  Location: [facility];  Service: Ophthalmology;  Laterality: Left;  Marland Kitchen VASECTOMY      There were no vitals filed for this visit.   Subjective Assessment - 02/13/19 1537    Subjective  The patient sustained an injury while bowling on November 1 (he released the ball wrong).  He developed bruising and swelling immediately after the injury.  He tried to return to bowling with a compression sleeve and developed  worsening pain, bruising in the medial aspect of the right elbow/arm.  He has soreness "dull ache", but no pain.    Patient Stated Goals  Return to bowling.    Currently in Pain?  Yes    Pain Score  0-No pain    Pain Location  Elbow    Pain Orientation  Right    Pain Descriptors / Indicators  Aching;Dull    Pain Type  Acute pain    Pain Onset  1 to 4 weeks ago    Pain Frequency  Intermittent    Aggravating Factors   more soreness than pain         [facility] PT Assessment - 02/13/19 1540      Assessment   Medical Diagnosis  S46.211A (ICD-10-CM) - Tear of right biceps muscle, initial encounter    Referring Provider (PT)  Silverio Decamp, MD    Onset Date/Surgical Date  02/02/19    Hand Dominance  Right    Prior Therapy  none      Precautions   Precautions  None      Restrictions   Weight Bearing Restrictions  No      Balance Screen   Has the patient fallen in the past 6 months  No  Has the patient had a decrease in activity level because of a fear of falling?   No    Is the patient reluctant to leave their home because of a fear of falling?   No      Home Film/video editor residence    Living Arrangements  Spouse/significant other      Prior Function   Level of Independence  Independent    Leisure  bowling and fishing      Sensation   Light Touch  Impaired Detail   patient got some mild tingling x 15 minutes last night    Additional Comments  He felt like position of R hand and swelling of elbow may have contributed      ROM / Strength   AROM / PROM / Strength  AROM;Strength      AROM   Overall AROM   Within functional limits for tasks performed    Overall AROM Comments  achiness during R forearm supination provokes achiness in the posterior aspect of the elbow "sore"      Strength   Overall Strength  Deficits    Overall Strength Comments  gets a dull ache with R elbow flexion    Strength Assessment Site   Elbow;Forearm;Wrist;Shoulder;Hand    Right/Left Shoulder  Right;Left    Right Shoulder Flexion  5/5    Right Shoulder ABduction  5/5    Left Shoulder Flexion  5/5    Left Shoulder ABduction  5/5    Right/Left Elbow  Right;Left    Right Elbow Flexion  5/5    Right Elbow Extension  5/5    Left Elbow Flexion  5/5    Left Elbow Extension  5/5    Right/Left Forearm  Right;Left    Right Forearm Pronation  3+/5   with dull ache   Right Forearm Supination  4/5    Left Forearm Pronation  5/5    Left Forearm Supination  5/5    Right/Left Wrist  Right;Left    Right Wrist Flexion  5/5    Right Wrist Extension  4/5   dull ache     Palpation   Palpation comment  Mild dull ache with palpation of R elbow and L olecranon process and ulnar tunnel                Objective measurements completed on examination: See above findings.      [facility] Adult PT Treatment/Exercise - 02/13/19 1548      Self-Care   Self-Care  Other Self-Care Comments    Other Self-Care Comments   ice massage             PT Education - 02/13/19 1748    Education Details  HEP initiated    Person(s) Educated  Patient    Methods  Explanation;Demonstration;Handout    Comprehension  Verbalized understanding;Returned demonstration       PT Short Term Goals - 02/13/19 1749      PT SHORT TERM GOAL #1   Title  The patient will be indep with HEP.    Time  4    Period  Weeks    Target Date  03/15/19      PT SHORT TERM GOAL #2   Title  The patient will improve strength pronation/supination to 5/5.    Time  4    Period  Weeks    Target Date  03/15/19      PT SHORT TERM GOAL #  3   Title  The patient will tolerate lifting 5 lbs to low cabinet to demonstrate ability to tolerate loading R UE for functional tasks.    Time  4    Period  Weeks    Target Date  03/15/19        PT Long Term Goals - 02/13/19 1751      PT LONG TERM GOAL #1   Title  The patient will be indep with progression of HEP.     Time  8    Period  Weeks    Target Date  04/14/19      PT LONG TERM GOAL #2   Title  The patient will be < or equal to 25% limited per FOTO to demo return to functional tasks.    Time  8    Period  Weeks    Target Date  04/14/19      PT LONG TERM GOAL #3   Title  The patient will be able to tolerate casting fishing line to return to recreational activities.    Time  8    Period  Weeks    Target Date  04/14/19      PT LONG TERM GOAL #4   Title  The patient will return to bowling without pain/swelling R elbow.    Time  8    Period  Weeks    Target Date  04/14/19             Plan - 02/13/19 1753    Clinical Impression Statement  The patient is a 68 year old male s/p short head of the biceps tear during bowling.  He presents with pain with pronation/supination, pain with elbow flexion with resistance, mild weakness with resisted pronation/supination R UE, swelling in R elbow.  Deficits lead to dec'd ability to participate in recreational activities and IADLs.  PT to address deficits to promote improved functional status.    Examination-Activity Limitations  Lift    Examination-Participation Restrictions  Community Activity;Other    Stability/Clinical Decision Making  Stable/Uncomplicated    Clinical Decision Making  Low    Rehab Potential  Good    PT Frequency  --   2x/week 4 weeks, 1x/week 4 weeks   PT Treatment/Interventions  ADLs/Self Care Home Management;Cryotherapy;Electrical Stimulation;Vasopneumatic Device;Manual techniques;Patient/family education;Therapeutic exercise;Therapeutic activities    PT Next Visit Plan  Check HEP, progress to tolerance strengthening in shoulder, elbow, wrist.  Consider vasopneumatic for edema if still present.    PT Home Exercise Plan  Access Code: [email or web address]    Consulted and Agree with Plan of Care  Patient       Patient will benefit from skilled therapeutic intervention in order to improve the following deficits and impairments:  Decreased  range of motion, Pain, Decreased strength  Visit Diagnosis: Pain in right elbow  Muscle weakness (generalized)     Problem List Patient Active Problem List   Diagnosis Date Noted  . Right distal biceps tendon tear 02/07/2019  . Vitreous hemorrhage, left eye ([place]) 12/12/2016  . Hypertensive heart disease 10/17/2016  . Essential hypertension 10/15/2014  . Complications affecting other specified body systems, hypertension 04/17/2013  . Mixed hyperlipidemia 04/17/2013  . Coronary atherosclerosis of native coronary artery 04/17/2013  . Bilateral carotid artery disease ([place]) 04/17/2013  . Nonspecific abnormal electrocardiogram (ECG) (EKG) 04/17/2013    Carsin Randazzo, PT 02/13/2019, 6:01 PM  [facility] [place], [place], [address] Phone: [phone]   Fax:  [phone]  Name: Jareb Talk MRN: [id number] Date of Birth: 06/19/1950

## 2019-02-18 ENCOUNTER — Encounter: Payer: Self-pay | Admitting: Physical Therapy

## 2019-02-18 ENCOUNTER — Ambulatory Visit (INDEPENDENT_AMBULATORY_CARE_PROVIDER_SITE_OTHER): Payer: Medicare Other | Admitting: Physical Therapy

## 2019-02-18 ENCOUNTER — Other Ambulatory Visit: Payer: Self-pay

## 2019-02-18 ENCOUNTER — Ambulatory Visit: Payer: Medicare Other | Admitting: Physical Therapy

## 2019-02-18 DIAGNOSIS — M25521 Pain in right elbow: Secondary | ICD-10-CM | POA: Diagnosis not present

## 2019-02-18 DIAGNOSIS — M6281 Muscle weakness (generalized): Secondary | ICD-10-CM | POA: Diagnosis not present

## 2019-02-18 NOTE — Therapy (Signed)
[facility] [place] [place], [place], [address] Phone: [phone]   Fax:  [phone]  Physical Therapy Treatment  Patient Details  Name: Jeremy Johnston MRN: [id number] Date of Birth: Sep 23, 1950 Referring Provider (PT): Silverio Decamp, MD   Encounter Date: 02/18/2019  PT End of Session - 02/18/19 0929    Visit Number  2    Number of Visits  12    Date for PT Re-Evaluation  04/14/19    Authorization Type  medicare and generic supplement    PT Start Time  0929    PT Stop Time  1004    PT Time Calculation (min)  35 min       Past Medical History:  Diagnosis Date  . Cancer ([place])    kidney cancer-both kidneys remain  . Chronic kidney disease    Robotic Kidney surgery for kidney cancer tumor-kidney remains-  . Coronary artery disease   . Diabetes mellitus (HCC)    Insulin pump -[name], and has a glucose transmitter attached.  . Diabetic retinopathy ([place])    left eye  . Hyperlipidemia   . Hypertension   . Sciatica   . Vitreous hemorrhage (HCC)    left eye    Past Surgical History:  Procedure Laterality Date  . CATARACT EXTRACTION, BILATERAL Bilateral    11'15  . CORONARY ARTERY BYPASS GRAFT  10/08   x4 vessels([place])  . DIAGNOSTIC LAPAROSCOPY    . EYE SURGERY     retina peele  . HERNIA REPAIR    . KIDNEY SURGERY     tumor removed  . PARS PLANA VITRECTOMY 27 GAUGE Left 12/12/2016   Procedure: PARS PLANA VITRECTOMY 39 GAUGE , ENDOLASER GAS/ FLUID EXCHAGNE;  Surgeon: Hayden Pedro, MD;  Location: [facility];  Service: Ophthalmology;  Laterality: Left;  Marland Kitchen VASECTOMY      There were no vitals filed for this visit.  Subjective Assessment - 02/18/19 0931    Subjective  He states that his brusing has improved and the swelling has gone down in his forearm. He has pain when he rests his weight on his elbow (burning pain).    Currently in Pain?  No/denies         [facility] PT Assessment - 02/18/19 0001       Assessment   Medical Diagnosis  S46.211A (ICD-10-CM) - Tear of right biceps muscle, initial encounter    Referring Provider (PT)  Silverio Decamp, MD    Onset Date/Surgical Date  02/02/19    Hand Dominance  Right    Next MD Visit  03/13/19    Prior Therapy  none        [facility] Adult PT Treatment/Exercise - 02/18/19 0001      Exercises   Exercises  Wrist;Elbow      Elbow Exercises   Elbow Flexion  Strengthening;Right;10 reps   2 lb weight     Wrist Exercises   Wrist Flexion  Strengthening;Right;10 reps   5 sec hold   Bar Weights/Barbell (Wrist Flexion)  2 lbs    Wrist Extension  Strengthening;Right;10 reps   5 sec hold   Bar Weights/Barbell (Wrist Extension)  2 lbs    Wrist Radial Deviation  Strengthening;Right;10 reps    Bar Weights/Barbell (Radial Deviation)  2 lbs    Other wrist exercises   Rt pronation/supination 2lb weight x 10 reps      Manual Therapy   Manual Therapy  Soft tissue mobilization;Taping  Manual therapy comments  Pt seated with arm supported on table.     Soft tissue mobilization  IASTM to Rt wrist flexors (prox) and distal bicep to decrease fascial adhesions and improve movement     Kinesiotex  Edema      Kinesiotix   Edema  I strip of reg [place] tape applied with 10% stretch over medial Rt elbow into forearm and I strip applied with 25% stretch over medial distal upper arm (area of bruising) - to decompress tissue, decrease bruising and increase proprioception.                PT Short Term Goals - 02/13/19 1749      PT SHORT TERM GOAL #1   Title  The patient will be indep with HEP.    Time  4    Period  Weeks    Target Date  03/15/19      PT SHORT TERM GOAL #2   Title  The patient will improve strength pronation/supination to 5/5.    Time  4    Period  Weeks    Target Date  03/15/19      PT SHORT TERM GOAL #3   Title  The patient will tolerate lifting 5 lbs to low cabinet to demonstrate ability to tolerate loading R UE  for functional tasks.    Time  4    Period  Weeks    Target Date  03/15/19        PT Long Term Goals - 02/13/19 1751      PT LONG TERM GOAL #1   Title  The patient will be indep with progression of HEP.    Time  8    Period  Weeks    Target Date  04/14/19      PT LONG TERM GOAL #2   Title  The patient will be < or equal to 25% limited per FOTO to demo return to functional tasks.    Time  8    Period  Weeks    Target Date  04/14/19      PT LONG TERM GOAL #3   Title  The patient will be able to tolerate casting fishing line to return to recreational activities.    Time  8    Period  Weeks    Target Date  04/14/19      PT LONG TERM GOAL #4   Title  The patient will return to bowling without pain/swelling R elbow.    Time  8    Period  Weeks    Target Date  04/14/19            Plan - 02/18/19 1104    Clinical Impression Statement  Pt with improved appearance of Rt elbow; decreased sweling and brusing.Reviewed HEP with pt; gave additional exercises. Pt tolerated all exercises well without reports of pain. Pt making progress toward STG #3 with increased tolerance of weight with exercise. Continues to benefit form skilled PT for improved functional mobility.    Examination-Activity Limitations  Lift    Examination-Participation Restrictions  Community Activity;Other    Stability/Clinical Decision Making  Stable/Uncomplicated    Rehab Potential  Good    PT Frequency  --   2x/week 4 weeks, 1x/week 4 weeks   PT Treatment/Interventions  ADLs/Self Care Home Management;Cryotherapy;Electrical Stimulation;Vasopneumatic Device;Manual techniques;Patient/family education;Therapeutic exercise;Therapeutic activities    PT Next Visit Plan  progress to tolerance strengthening in shoulder, elbow, wrist; manual    PT  Home Exercise Plan  Access Code: [email or web address] and Agree with Plan of Care  Patient       Patient will benefit from skilled therapeutic intervention in order to  improve the following deficits and impairments:  Decreased range of motion, Pain, Decreased strength  Visit Diagnosis: Muscle weakness (generalized)  Pain in right elbow     Problem List Patient Active Problem List   Diagnosis Date Noted  . Right distal biceps tendon tear 02/07/2019  . Vitreous hemorrhage, left eye ([place]) 12/12/2016  . Hypertensive heart disease 10/17/2016  . Essential hypertension 10/15/2014  . Complications affecting other specified body systems, hypertension 04/17/2013  . Mixed hyperlipidemia 04/17/2013  . Coronary atherosclerosis of native coronary artery 04/17/2013  . Bilateral carotid artery disease ([place]) 04/17/2013  . Nonspecific abnormal electrocardiogram (ECG) (EKG) 04/17/2013    Gardiner Rhyme, SPTA 02/18/2019, 11:12 AM   Kerin Perna, PTA 02/18/19 1:01 PM   [facility] [place], [place], [address] Phone: [phone]   Fax:  [phone]  Name: Jeremy Johnston MRN: [id number] Date of Birth: [birth year]

## 2019-02-18 NOTE — Patient Instructions (Signed)
Access Code: [email or web address]  URL: [email or web address]  Date: 02/18/2019  Prepared by: Kerin Perna   Exercises  Added: Seated Wrist Radial Deviation with Anchored Resistance - 10 reps - 2 sets - 2x daily - 7x weekly  Standing Wrist Flexion Stretch - 3 reps - 1 sets - 20 hold - 2x daily - 7x weekly  Standing Wrist Extension Stretch - 3 reps - 1 sets - 20 hold - 2x daily - 7x weekly

## 2019-02-21 ENCOUNTER — Encounter: Payer: Medicare Other | Admitting: Physical Therapy

## 2019-02-24 ENCOUNTER — Other Ambulatory Visit: Payer: Self-pay

## 2019-02-24 ENCOUNTER — Ambulatory Visit (INDEPENDENT_AMBULATORY_CARE_PROVIDER_SITE_OTHER): Payer: Medicare Other | Admitting: Rehabilitative and Restorative Service Providers"

## 2019-02-24 ENCOUNTER — Encounter: Payer: Self-pay | Admitting: Rehabilitative and Restorative Service Providers"

## 2019-02-24 DIAGNOSIS — M6281 Muscle weakness (generalized): Secondary | ICD-10-CM | POA: Diagnosis not present

## 2019-02-24 DIAGNOSIS — M25521 Pain in right elbow: Secondary | ICD-10-CM

## 2019-02-24 NOTE — Patient Instructions (Signed)
Access Code: [email or web address]  URL: [email or web address]  Date: 02/24/2019  Prepared by: Rudell Cobb   Exercises Standing Elbow Flexion Extension AROM - 10 reps - 2 sets - 2x daily - 7x weekly Wrist Flexion with Dumbbell - 10 reps - 2 sets - 2x daily - 7x weekly Wrist Extension with Dumbbell - 10 reps - 2 sets - 2x daily - 7x weekly Forearm Pronation with Dumbbell - 10 reps - 2 sets - 1x daily - 7x weekly Seated Wrist Radial Deviation with Anchored Resistance - 10 reps - 2 sets - 2x daily - 7x weekly Standing Wrist Flexion Stretch - 3 reps - 20 seconds hold - 2x daily - 7x weekly Standing Wrist Extension Stretch - 3 reps - 20 hold - 2x daily - 7x weekly Standing Shoulder Flexion with Dumbbells - 10 reps - 2 sets - 2x daily - 7x weekly Shoulder Abduction with Dumbbells - Thumbs Up - 10 reps - 2 sets - 2x daily - 7x weekly Supine Thoracic Mobilization Towel Roll Vertical with Arm Stretch - 1 reps - 1 sets - 60 seconds hold - 2x daily - 7x weekly

## 2019-02-24 NOTE — Therapy (Signed)
[place], [place], [address] Phone: [phone]   Fax:  [phone]  Physical Therapy Treatment  Patient Details  Name: Jeremy Johnston MRN: [id number] Date of Birth: 01-19-1951 Referring Provider (PT): Silverio Decamp, MD   Encounter Date: 02/24/2019  PT End of Session - 02/24/19 0956    Visit Number  3    Number of Visits  12    Date for PT Re-Evaluation  04/14/19    Authorization Type  medicare and generic supplement    PT Start Time  0933    PT Stop Time  1013    PT Time Calculation (min)  40 min       Past Medical History:  Diagnosis Date  . Cancer ([place])    kidney cancer-both kidneys remain  . Chronic kidney disease    Robotic Kidney surgery for kidney cancer tumor-kidney remains-  . Coronary artery disease   . Diabetes mellitus (HCC)    Insulin pump -[name], and has a glucose transmitter attached.  . Diabetic retinopathy ([place])    left eye  . Hyperlipidemia   . Hypertension   . Sciatica   . Vitreous hemorrhage (HCC)    left eye    Past Surgical History:  Procedure Laterality Date  . CATARACT EXTRACTION, BILATERAL Bilateral    11'15  . CORONARY ARTERY BYPASS GRAFT  10/08   x4 vessels([place])  . DIAGNOSTIC LAPAROSCOPY    . EYE SURGERY     retina peele  . HERNIA REPAIR    . KIDNEY SURGERY     tumor removed  . PARS PLANA VITRECTOMY 27 GAUGE Left 12/12/2016   Procedure: PARS PLANA VITRECTOMY 31 GAUGE , ENDOLASER GAS/ FLUID EXCHAGNE;  Surgeon: Hayden Pedro, MD;  Location: [facility];  Service: Ophthalmology;  Laterality: Left;  Marland Kitchen VASECTOMY      There were no vitals filed for this visit.  Subjective Assessment - 02/24/19 0935    Subjective  The tape came off yesterday.  He reports he was able to fish and cast a line without pain in the right arm.  He notes he forgot at times about his elbow and would pull the line in.  He denies pain at rest, but does get occasional deep  aching after activities.    Patient Stated Goals  Return to bowling.    Currently in Pain?  No/denies                       [facility] Adult PT Treatment/Exercise - 02/24/19 0938      Exercises   Exercises  Wrist;Elbow;Shoulder      Elbow Exercises   Elbow Flexion  Strengthening;Right;15 reps    Bar Weights/Barbell (Elbow Flexion)  3 lbs    Elbow Extension  Strengthening;Right   12 reps   Bar Weights/Barbell (Elbow Extension)  3 lbs    Forearm Supination  Strengthening;Right   12 reps   Bar Weights/Barbell (Forearm Supination)  1 lb;2 lbs    Forearm Pronation  Strengthening;Right   12 reps   Bar Weights/Barbell (Forearm Pronation)  1 lb;2 lbs    Other elbow exercises  UE D1 and D2 patterns with 1 lb weight x 10 reps      Shoulder Exercises: Supine   Other Supine Exercises  Thoracic self mobilization towel roll in "T" and "V" positions      Shoulder Exercises: Standing   Flexion  12 reps;Strengthening;Right    Shoulder  Flexion Weight (lbs)  3 lbs    ABduction  Strengthening;Both;12 reps    Shoulder ABduction Weight (lbs)  3 lbs    Other Standing Exercises  Wall scapular protraction/retraction x 10 reps    Other Standing Exercises  Wall press up x 10 reps      Manual Therapy   Manual Therapy  Soft tissue mobilization    Manual therapy comments  Seated with arm support    Soft tissue mobilization  R wrist flexors brachioradialis             PT Education - 02/24/19 1257    Education Details  HEP    Person(s) Educated  Patient    Methods  Explanation;Demonstration;Handout    Comprehension  Verbalized understanding;Returned demonstration       PT Short Term Goals - 02/13/19 1749      PT SHORT TERM GOAL #1   Title  The patient will be indep with HEP.    Time  4    Period  Weeks    Target Date  03/15/19      PT SHORT TERM GOAL #2   Title  The patient will improve strength pronation/supination to 5/5.    Time  4    Period  Weeks    Target Date   03/15/19      PT SHORT TERM GOAL #3   Title  The patient will tolerate lifting 5 lbs to low cabinet to demonstrate ability to tolerate loading R UE for functional tasks.    Time  4    Period  Weeks    Target Date  03/15/19        PT Long Term Goals - 02/24/19 1308      PT LONG TERM GOAL #1   Title  The patient will be indep with progression of HEP.    Time  8    Period  Weeks      PT LONG TERM GOAL #2   Title  The patient will be < or equal to 25% limited per FOTO to demo return to functional tasks.    Time  8    Period  Weeks      PT LONG TERM GOAL #3   Title  The patient will be able to tolerate casting fishing line to return to recreational activities.    Time  8    Period  Weeks    Status  Achieved      PT LONG TERM GOAL #4   Title  The patient will return to bowling without pain/swelling R elbow.    Time  8    Period  Weeks            Plan - 02/24/19 1309    Clinical Impression Statement  The patient met LTG for return to fishing.  He was able to cast and pull line in without increased pain.  PT progressed patient into greater resistance today with pronation<>supination, shoulder strengthening (maintaining extended elbow position), and flexion/extension of the elbow.  Continue working to [employer].    PT Treatment/Interventions  ADLs/Self Care Home Management;Cryotherapy;Electrical Stimulation;Vasopneumatic Device;Manual techniques;Patient/family education;Therapeutic exercise;Therapeutic activities    PT Next Visit Plan  Check STGs, continue progressing into functional movements adding resistance with end goal of simulating bowling with light weight initially, manual, strengthening R UE    PT Home Exercise Plan  Access Code: [email or web address]    Consulted and Agree with Plan of Care  Patient  Patient will benefit from skilled therapeutic intervention in order to improve the following deficits and impairments:  Decreased range of motion, Pain, Decreased  strength  Visit Diagnosis: Pain in right elbow  Muscle weakness (generalized)     Problem List Patient Active Problem List   Diagnosis Date Noted  . Right distal biceps tendon tear 02/07/2019  . Vitreous hemorrhage, left eye ([place]) 12/12/2016  . Hypertensive heart disease 10/17/2016  . Essential hypertension 10/15/2014  . Complications affecting other specified body systems, hypertension 04/17/2013  . Mixed hyperlipidemia 04/17/2013  . Coronary atherosclerosis of native coronary artery 04/17/2013  . Bilateral carotid artery disease ([place]) 04/17/2013  . Nonspecific abnormal electrocardiogram (ECG) (EKG) 04/17/2013    WEAVER,CHRISTINA, PT 02/24/2019, 1:10 PM  [facility] [place], [place], [address] Phone: [phone]   Fax:  [phone]  Name: Brannon Levene MRN: [id number] Date of Birth: 07/09/1950

## 2019-02-26 ENCOUNTER — Encounter: Payer: Medicare Other | Admitting: Rehabilitative and Restorative Service Providers"

## 2019-03-03 ENCOUNTER — Ambulatory Visit (INDEPENDENT_AMBULATORY_CARE_PROVIDER_SITE_OTHER): Payer: Medicare Other | Admitting: Rehabilitative and Restorative Service Providers"

## 2019-03-03 ENCOUNTER — Other Ambulatory Visit: Payer: Self-pay

## 2019-03-03 ENCOUNTER — Encounter: Payer: Self-pay | Admitting: Rehabilitative and Restorative Service Providers"

## 2019-03-03 DIAGNOSIS — M6281 Muscle weakness (generalized): Secondary | ICD-10-CM | POA: Diagnosis not present

## 2019-03-03 DIAGNOSIS — M25521 Pain in right elbow: Secondary | ICD-10-CM | POA: Diagnosis not present

## 2019-03-03 NOTE — Patient Instructions (Addendum)
Access Code: [email or web address]  URL: [email or web address]  Date: 03/03/2019  Prepared by: Rudell Cobb   Program Notes  Ice for 20 minutes after exercises or as needed for swelling.   Exercises Standing Shoulder Flexion with Dumbbells - 10 reps - 2 sets - 1x daily - 7x weekly Shoulder Abduction with Dumbbells - Thumbs Up - 10 reps - 2 sets - 1x daily - 7x weekly Standing Bicep Curls Neutral with Dumbbells - 10 reps - 2 sets - 1x daily - 7x weekly Shoulder extension with resistance - Neutral - 10 reps - 2 sets - 3 seconds hold - 1x daily - 7x weekly Single Arm Shoulder Extension with Dumbbell - 10 reps - 2 sets - 1x daily - 7x weekly Forearm Pronation with Dumbbell - 10 reps - 2 sets - 1x daily - 7x weekly Standing Wrist Flexion Stretch - 3 reps - 20 seconds hold - 2x daily - 7x weekly Standing Wrist Extension Stretch - 3 reps - 20 hold - 2x daily - 7x weekly  Door frame stretch @ 90 degrees.

## 2019-03-04 NOTE — Therapy (Signed)
[place], [place], [address] Phone: [phone]   Fax:  [phone]  Physical Therapy Treatment  Patient Details  Name: Jeremy Johnston MRN: [id number] Date of Birth: 05/27/1950 Referring Provider (PT): Silverio Decamp, MD   Encounter Date: 03/03/2019  PT End of Session - 03/03/19 1526    Visit Number  4    Number of Visits  12    Date for PT Re-Evaluation  04/14/19    Authorization Type  medicare and generic supplement    PT Start Time  1522    PT Stop Time  1600    PT Time Calculation (min)  38 min       Past Medical History:  Diagnosis Date  . Cancer ([place])    kidney cancer-both kidneys remain  . Chronic kidney disease    Robotic Kidney surgery for kidney cancer tumor-kidney remains-  . Coronary artery disease   . Diabetes mellitus (HCC)    Insulin pump -[name], and has a glucose transmitter attached.  . Diabetic retinopathy ([place])    left eye  . Hyperlipidemia   . Hypertension   . Sciatica   . Vitreous hemorrhage (HCC)    left eye    Past Surgical History:  Procedure Laterality Date  . CATARACT EXTRACTION, BILATERAL Bilateral    11'15  . CORONARY ARTERY BYPASS GRAFT  10/08   x4 vessels([place])  . DIAGNOSTIC LAPAROSCOPY    . EYE SURGERY     retina peele  . HERNIA REPAIR    . KIDNEY SURGERY     tumor removed  . PARS PLANA VITRECTOMY 27 GAUGE Left 12/12/2016   Procedure: PARS PLANA VITRECTOMY 55 GAUGE , ENDOLASER GAS/ FLUID EXCHAGNE;  Surgeon: Hayden Pedro, MD;  Location: [facility];  Service: Ophthalmology;  Laterality: Left;  Marland Kitchen VASECTOMY      There were no vitals filed for this visit.  Subjective Assessment - 03/03/19 1522    Subjective  No pain, gets occasional deep ache.  He reports he picked up a 3 gallon tank of gas and remembered afterward to be cautious.  He did not get pain with that movement.    Patient Stated Goals  Return to bowling.    Currently in Pain?   No/denies                       [facility] Adult PT Treatment/Exercise - 03/03/19 1528      Exercises   Exercises  Wrist;Elbow;Shoulder      Elbow Exercises   Elbow Flexion  Strengthening;Right;10 reps    Bar Weights/Barbell (Elbow Flexion)  5 lbs    Elbow Extension  Strengthening;Right;10 reps    Bar Weights/Barbell (Elbow Extension)  5 lbs    Forearm Supination  Strengthening;Right;10 reps    Bar Weights/Barbell (Forearm Supination)  3 lbs    Forearm Pronation  Strengthening;Right;10 reps    Bar Weights/Barbell (Forearm Pronation)  3 lbs    Other elbow exercises  UE D1 and D2 pattern with red theraband anchored in door. x 10 reps    Other elbow exercises  Standing using 5 lb weight to extend R shoulder and then move through beginning portion of bowling mechanics x 10 reps (using weight like a pendulum and gently swinging while in lunged position).      Shoulder Exercises: Standing   Flexion  Strengthening;Right;10 reps    Shoulder Flexion Weight (lbs)  5 bs    ABduction  Strengthening;Right;10 reps    Shoulder ABduction Weight (lbs)  5 lbs    Extension  Strengthening;Right;10 reps    Theraband Level (Shoulder Extension)  Level 2 (Red)    Retraction  Strengthening;Both;10 reps    Retraction Limitations  in wall push up position      Shoulder Exercises: ROM/Strengthening   UBE (Upper Arm Bike)  L2 forward x 2.5 minutes, L1 backwards x 1.0 minutes      Shoulder Exercises: Stretch   Other Shoulder Stretches  Door Frame stretch at 90 degrees.      Wrist Exercises   Wrist Flexion  PROM    Wrist Flexion Limitations  stretching x 3 reps x 10 seconds    Wrist Extension  PROM    Wrist Extension Limitations  stretching x 3 reps x 10 seconds             PT Education - 03/03/19 1610    Education Details  HEP    Person(s) Educated  Patient    Methods  Explanation;Demonstration;Handout    Comprehension  Verbalized understanding;Returned demonstration       PT  Short Term Goals - 03/03/19 1524      PT SHORT TERM GOAL #1   Title  The patient will be indep with HEP.    Time  4    Period  Weeks    Status  Achieved    Target Date  03/15/19      PT SHORT TERM GOAL #2   Title  The patient will improve strength pronation/supination to 5/5.    Time  4    Period  Weeks    Target Date  03/15/19      PT SHORT TERM GOAL #3   Title  The patient will tolerate lifting 5 lbs to low cabinet to demonstrate ability to tolerate loading R UE for functional tasks.    Time  4    Period  Weeks    Status  Achieved    Target Date  03/15/19        PT Long Term Goals - 02/24/19 1308      PT LONG TERM GOAL #1   Title  The patient will be indep with progression of HEP.    Time  8    Period  Weeks      PT LONG TERM GOAL #2   Title  The patient will be < or equal to 25% limited per FOTO to demo return to functional tasks.    Time  8    Period  Weeks      PT LONG TERM GOAL #3   Title  The patient will be able to tolerate casting fishing line to return to recreational activities.    Time  8    Period  Weeks    Status  Achieved      PT LONG TERM GOAL #4   Title  The patient will return to bowling without pain/swelling R elbow.    Time  8    Period  Weeks            Plan - 03/04/19 1317    Clinical Impression Statement  The patient has met 2 STGs and 1 LTG.  Continue progressing working on task specific training for return to bowling.    PT Treatment/Interventions  ADLs/Self Care Home Management;Cryotherapy;Electrical Stimulation;Vasopneumatic Device;Manual techniques;Patient/family education;Therapeutic exercise;Therapeutic activities    PT Next Visit Plan  Continue working towards STGs progressing task specific bowling training.  PT Home Exercise Plan  Access Code: [email or web address]    Consulted and Agree with Plan of Care  Patient       Patient will benefit from skilled therapeutic intervention in order to improve the following deficits and  impairments:  Decreased range of motion, Pain, Decreased strength  Visit Diagnosis: Pain in right elbow  Muscle weakness (generalized)     Problem List Patient Active Problem List   Diagnosis Date Noted  . Right distal biceps tendon tear 02/07/2019  . Vitreous hemorrhage, left eye ([place]) 12/12/2016  . Hypertensive heart disease 10/17/2016  . Essential hypertension 10/15/2014  . Complications affecting other specified body systems, hypertension 04/17/2013  . Mixed hyperlipidemia 04/17/2013  . Coronary atherosclerosis of native coronary artery 04/17/2013  . Bilateral carotid artery disease ([place]) 04/17/2013  . Nonspecific abnormal electrocardiogram (ECG) (EKG) 04/17/2013    Makenzi Bannister, PT 03/04/2019, 1:19 PM  [facility] [place], [place], [address] Phone: [phone]   Fax:  [phone]  Name: Jeremy Johnston MRN: [id number] Date of Birth: May 24, 1950

## 2019-03-05 ENCOUNTER — Encounter (INDEPENDENT_AMBULATORY_CARE_PROVIDER_SITE_OTHER): Payer: Medicare Other | Admitting: Ophthalmology

## 2019-03-05 ENCOUNTER — Other Ambulatory Visit: Payer: Self-pay

## 2019-03-05 DIAGNOSIS — E11311 Type 2 diabetes mellitus with unspecified diabetic retinopathy with macular edema: Secondary | ICD-10-CM

## 2019-03-05 DIAGNOSIS — H43811 Vitreous degeneration, right eye: Secondary | ICD-10-CM

## 2019-03-05 DIAGNOSIS — E113513 Type 2 diabetes mellitus with proliferative diabetic retinopathy with macular edema, bilateral: Secondary | ICD-10-CM

## 2019-03-05 DIAGNOSIS — I10 Essential (primary) hypertension: Secondary | ICD-10-CM | POA: Diagnosis not present

## 2019-03-05 DIAGNOSIS — H35033 Hypertensive retinopathy, bilateral: Secondary | ICD-10-CM

## 2019-03-07 ENCOUNTER — Encounter: Payer: Medicare Other | Admitting: Rehabilitative and Restorative Service Providers"

## 2019-03-07 ENCOUNTER — Encounter (INDEPENDENT_AMBULATORY_CARE_PROVIDER_SITE_OTHER): Payer: Medicare Other | Admitting: Ophthalmology

## 2019-03-10 ENCOUNTER — Ambulatory Visit (INDEPENDENT_AMBULATORY_CARE_PROVIDER_SITE_OTHER): Payer: Medicare Other | Admitting: Rehabilitative and Restorative Service Providers"

## 2019-03-10 ENCOUNTER — Other Ambulatory Visit: Payer: Self-pay

## 2019-03-10 DIAGNOSIS — M25521 Pain in right elbow: Secondary | ICD-10-CM

## 2019-03-10 DIAGNOSIS — M6281 Muscle weakness (generalized): Secondary | ICD-10-CM | POA: Diagnosis not present

## 2019-03-10 NOTE — Therapy (Signed)
[place], [place], [address] Phone: [phone]   Fax:  [phone]  Physical Therapy Treatment  Patient Details  Name: Jeremy Johnston MRN: [id number] Date of Birth: [birth year] Referring Provider (PT): Silverio Decamp, MD   Encounter Date: 03/10/2019  PT End of Session - 03/10/19 0719    Visit Number  5    Number of Visits  12    Date for PT Re-Evaluation  04/14/19    Authorization Type  medicare and generic supplement    PT Start Time  0715    PT Stop Time  0801    PT Time Calculation (min)  46 min    Activity Tolerance  Patient tolerated treatment well    Behavior During Therapy  [facility] for tasks assessed/performed       Past Medical History:  Diagnosis Date  . Cancer ([place])    kidney cancer-both kidneys remain  . Chronic kidney disease    Robotic Kidney surgery for kidney cancer tumor-kidney remains-  . Coronary artery disease   . Diabetes mellitus ([facility])    Insulin pump -[name], and has a glucose transmitter attached.  . Diabetic retinopathy ([place])    left eye  . Hyperlipidemia   . Hypertension   . Sciatica   . Vitreous hemorrhage ([facility])    left eye    Past Surgical History:  Procedure Laterality Date  . CATARACT EXTRACTION, BILATERAL Bilateral    11'15  . CORONARY ARTERY BYPASS GRAFT  10/08   x4 vessels([place])  . DIAGNOSTIC LAPAROSCOPY    . EYE SURGERY     retina peele  . HERNIA REPAIR    . KIDNEY SURGERY     tumor removed  . PARS PLANA VITRECTOMY 27 GAUGE Left 12/12/2016   Procedure: PARS PLANA VITRECTOMY 78 GAUGE , ENDOLASER GAS/ FLUID EXCHAGNE;  Surgeon: Hayden Pedro, MD;  Location: [facility];  Service: Ophthalmology;  Laterality: Left;  Marland Kitchen VASECTOMY      There were no vitals filed for this visit.  Subjective Assessment - 03/10/19 0719    Subjective  Patient continues iwth occasional sensation of "deep ache" in R arm, no pain.  He is performing HEP daily.     Patient Stated Goals  Return to bowling.    Currently in Pain?  No/denies                       [facility] Adult PT Treatment/Exercise - 03/10/19 0718      Therapeutic Activites    Therapeutic Activities  Other Therapeutic Activities    Other Therapeutic Activities  The patient and PT discussed bowling mechanics and beginning task specific training.  He is going to use sofa to practice swing mechanics with 14 lb bowling ball (he cannot use a lighter ball b/c this one is the best fit for his hand per patient).      Exercises   Exercises  Wrist;Elbow;Shoulder      Elbow Exercises   Elbow Flexion  Strengthening;10 reps;Both    Bar Weights/Barbell (Elbow Flexion)  --   7 lbs   Forearm Supination  Right;10 reps    Bar Weights/Barbell (Forearm Supination)  3 lbs    Forearm Supination Limitations  with arm flexed to 90 degrees for stabilization    Forearm Pronation  Right;Strengthening;10 reps    Bar Weights/Barbell (Forearm Pronation)  3 lbs    Forearm Pronation Limitations  with arm flexed to 90 degrees  Other elbow exercises  Extended R arm with hand flat against wall for stretch.      Shoulder Exercises: Prone   Retraction  Strengthening;Both;15 reps    Retraction Limitations  with arms at 90 degrees abduction    Flexion  Right;Left;Strengthening;10 reps    Other Prone Exercises  quadriped UE flexion alternating UEs    Other Prone Exercises  Push up position with shoulder stabilization      Shoulder Exercises: Standing   Flexion  Strengthening;Right;10 reps    Shoulder Flexion Weight (lbs)  7 lbs    ABduction  Strengthening;Right;10 reps    Shoulder ABduction Weight (lbs)  7 lbs    Extension  Strengthening;Both;10 reps    Extension Weight (lbs)  7 lbs    Other Standing Exercises  Wall push up x 10 reps      Shoulder Exercises: ROM/Strengthening   UBE (Upper Arm Bike)  L1 and L 2 2 minutes forward, 1 minute backwards.      Shoulder Exercises: Stretch   Other  Shoulder Stretches  door frame stretch at 30 degrees abduction and then 90 degrees abduction.             PT Education - 03/10/19 1322    Education Details  HEP updated    Person(s) Educated  Patient    Methods  Explanation;Demonstration;Handout    Comprehension  Verbalized understanding;Returned demonstration       PT Short Term Goals - 03/10/19 0732      PT SHORT TERM GOAL #1   Title  The patient will be indep with HEP.    Time  4    Period  Weeks    Status  Achieved    Target Date  03/15/19      PT SHORT TERM GOAL #2   Title  The patient will improve strength pronation/supination to 5/5.    Time  4    Period  Weeks    Status  Achieved    Target Date  03/15/19      PT SHORT TERM GOAL #3   Title  The patient will tolerate lifting 5 lbs to low cabinet to demonstrate ability to tolerate loading R UE for functional tasks.    Time  4    Period  Weeks    Status  Achieved    Target Date  03/15/19        PT Long Term Goals - 03/10/19 0731      PT LONG TERM GOAL #1   Title  The patient will be indep with progression of HEP.    Time  8    Period  Weeks      PT LONG TERM GOAL #2   Title  The patient will be < or equal to 25% limited per FOTO to demo return to functional tasks.    Time  8    Period  Weeks      PT LONG TERM GOAL #3   Title  The patient will be able to tolerate casting fishing line to return to recreational activities.    Time  8    Period  Weeks    Status  Achieved      PT LONG TERM GOAL #4   Title  The patient will return to bowling without pain/swelling R elbow.    Time  8    Period  Weeks            Plan - 03/10/19 3716  Clinical Impression Statement  The patient has met all STGs and 1 LTG.  He is tolerating ther ex with increasing resistance well.  PT encouraged initating return to task specific sport/bowling training.  He is going to begin with a bowling ball release onto the couch standing in stride position to initiate the  motion for return to bowling.  He is tolerating 7 lbs of resistance during UE training in the clinic.  Recommended low repetitions to begin to decrease overall load as his bowling ball is a 14 lb ball.    PT Treatment/Interventions  ADLs/Self Care Home Management;Cryotherapy;Electrical Stimulation;Vasopneumatic Device;Manual techniques;Patient/family education;Therapeutic exercise;Therapeutic activities    PT Next Visit Plan  Continue working to [place] progressing back to return to bowling.    PT Home Exercise Plan  Access Code: [email or web address]    Consulted and Agree with Plan of Care  Patient       Patient will benefit from skilled therapeutic intervention in order to improve the following deficits and impairments:  Decreased range of motion, Pain, Decreased strength  Visit Diagnosis: Pain in right elbow  Muscle weakness (generalized)     Problem List Patient Active Problem List   Diagnosis Date Noted  . Right distal biceps tendon tear 02/07/2019  . Vitreous hemorrhage, left eye ([place]) 12/12/2016  . Hypertensive heart disease 10/17/2016  . Essential hypertension 10/15/2014  . Complications affecting other specified body systems, hypertension 04/17/2013  . Mixed hyperlipidemia 04/17/2013  . Coronary atherosclerosis of native coronary artery 04/17/2013  . Bilateral carotid artery disease ([place]) 04/17/2013  . Nonspecific abnormal electrocardiogram (ECG) (EKG) 04/17/2013    Thomasenia Dowse, PT 03/10/2019, 1:36 PM  [facility] [place], [place], [address] Phone: [phone]   Fax:  [phone]  Name: Jeremy Johnston MRN: [id number] Date of Birth: [birth year]

## 2019-03-10 NOTE — Patient Instructions (Signed)
Access Code: [email or web address]  URL: [email or web address]  Date: 03/10/2019  Prepared by: Rudell Cobb   Program Notes  TASK SPECIFIC TRAINING:  Bowling=begin with bowling ball release onto the couch while your are standing in a staggered stance.  Ice for 20 minutes after exercises or as needed for swelling.   Exercises Standing Shoulder Flexion with Dumbbells - 10 reps - 2 sets - 1x daily - 7x weekly Shoulder Abduction with Dumbbells - Thumbs Up - 10 reps - 2 sets - 1x daily - 7x weekly Standing Bicep Curls Neutral with Dumbbells - 10 reps - 2 sets - 1x daily - 7x weekly Shoulder extension with resistance - Neutral - 10 reps - 2 sets - 3 seconds hold - 1x daily - 7x weekly Single Arm Shoulder Extension with Dumbbell - 10 reps - 2 sets - 1x daily - 7x weekly Forearm Pronation with Dumbbell - 10 reps - 2 sets - 1x daily - 7x weekly Standing Wrist Flexion Stretch - 3 reps - 20 seconds hold - 2x daily - 7x weekly Standing Wrist Extension Stretch - 3 reps - 20 hold - 2x daily - 7x weekly Doorway Pec Stretch at 90 Degrees Abduction - 1 reps - 3 sets - 30 seconds hold - 2x daily - 7x weekly

## 2019-03-13 ENCOUNTER — Ambulatory Visit (INDEPENDENT_AMBULATORY_CARE_PROVIDER_SITE_OTHER): Payer: Medicare Other | Admitting: Sports Medicine

## 2019-03-13 ENCOUNTER — Other Ambulatory Visit: Payer: Self-pay

## 2019-03-13 ENCOUNTER — Encounter: Payer: Self-pay | Admitting: Sports Medicine

## 2019-03-13 DIAGNOSIS — S46211D Strain of muscle, fascia and tendon of other parts of biceps, right arm, subsequent encounter: Secondary | ICD-10-CM | POA: Diagnosis not present

## 2019-03-13 DIAGNOSIS — I6523 Occlusion and stenosis of bilateral carotid arteries: Secondary | ICD-10-CM | POA: Diagnosis not present

## 2019-03-13 NOTE — Progress Notes (Signed)
Subjective:    CC: Follow-up  HPI: This is a pleasant 68 year old male, he was bowling, unfortunately his ball release was not as expected, and he felt a pop in his anterior elbow.  He had immediate bruising, weakness, clinically this represented a complete distal biceps rupture, MRI confirmed what was expected.  He has done extremely well with formal physical therapy, he has no pain, his strength is back and is eager to get back into the bowling alley.  I reviewed the past medical history, family history, social history, surgical history, and allergies today and no changes were needed.  Please see the problem list section below in epic for further details.  Past Medical History: Past Medical History:  Diagnosis Date  . Cancer ([facility])    kidney cancer-both kidneys remain  . Chronic kidney disease    Robotic Kidney surgery for kidney cancer tumor-kidney remains-  . Coronary artery disease   . Diabetes mellitus (HCC)    Insulin pump -[name], and has a glucose transmitter attached.  . Diabetic retinopathy ([place])    left eye  . Hyperlipidemia   . Hypertension   . Sciatica   . Vitreous hemorrhage (HCC)    left eye   Past Surgical History: Past Surgical History:  Procedure Laterality Date  . CATARACT EXTRACTION, BILATERAL Bilateral    11'15  . CORONARY ARTERY BYPASS GRAFT  10/08   x4 vessels([place])  . DIAGNOSTIC LAPAROSCOPY    . EYE SURGERY     retina peele  . HERNIA REPAIR    . KIDNEY SURGERY     tumor removed  . PARS PLANA VITRECTOMY 27 GAUGE Left 12/12/2016   Procedure: PARS PLANA VITRECTOMY 66 GAUGE , ENDOLASER GAS/ FLUID EXCHAGNE;  Surgeon: Hayden Pedro, MD;  Location: [facility];  Service: Ophthalmology;  Laterality: Left;  Marland Kitchen VASECTOMY     Social History: Social History   Socioeconomic History  . Marital status: Married    Spouse name: Not on file  . Number of children: Not on file  . Years of education: Not on file  . Highest education level: Not on file   Occupational History  . Not on file  Tobacco Use  . Smoking status: Never Smoker  . Smokeless tobacco: Never Used  Substance and Sexual Activity  . Alcohol use: No  . Drug use: No  . Sexual activity: Not on file  Other Topics Concern  . Not on file  Social History Narrative  . Not on file   Social Determinants of Health   Financial Resource Strain:   . Difficulty of Paying Living Expenses: Not on file  Food Insecurity:   . Worried About Charity fundraiser in the Last Year: Not on file  . Ran Out of Food in the Last Year: Not on file  Transportation Needs:   . Lack of Transportation (Medical): Not on file  . Lack of Transportation (Non-Medical): Not on file  Physical Activity:   . Days of Exercise per Week: Not on file  . Minutes of Exercise per Session: Not on file  Stress:   . Feeling of Stress : Not on file  Social Connections:   . Frequency of Communication with Friends and Family: Not on file  . Frequency of Social Gatherings with Friends and Family: Not on file  . Attends Religious Services: Not on file  . Active Member of Clubs or Organizations: Not on file  . Attends Archivist Meetings: Not on file  . Marital  Status: Not on file   Family History: Family History  Problem Relation Age of Onset  . Heart attack Father   . Heart attack Sister   . Diabetes Sister    Allergies: No Known Allergies Medications: See med rec.  Review of Systems: No fevers, chills, night sweats, weight loss, chest pain, or shortness of breath.   Objective:    General: Well Developed, well nourished, and in no acute distress.  Neuro: Alert and oriented x3, extra-ocular muscles intact, sensation grossly intact.  HEENT: Normocephalic, atraumatic, pupils equal round reactive to light, neck supple, no masses, no lymphadenopathy, thyroid nonpalpable.  Skin: Warm and dry, no rashes. Cardiac: Regular rate and rhythm, no murmurs rubs or gallops, no lower extremity edema.   Respiratory: Clear to auscultation bilaterally. Not using accessory muscles, speaking in full sentences. Right elbow:  Unremarkable to inspection. Range of motion full pronation, supination, flexion, extension. Strength is full to all of the above directions, specifically to supination against resistance. Stable to varus, valgus stress. Negative moving valgus stress test. No discrete areas of tenderness to palpation. Ulnar nerve does not sublux. Negative cubital tunnel Tinel's.  Impression and Recommendations:    Right distal biceps tendon tear Complete right distal biceps rupture, we have been able to rehab through it, he has good strength, no pain. I am okay with him going back to bowling though he will start slowly, he will warm up and stretch appropriately prior to sessions and wear his compression sleeve. Return as needed.   ___________________________________________ Gwen Her. Dianah Field, M.D., ABFM., CAQSM. Primary Care and Sports Medicine [employer] [facility]  Adjunct Professor of [employer] [employer]

## 2019-03-13 NOTE — Assessment & Plan Note (Signed)
Complete right distal biceps rupture, we have been able to rehab through it, he has good strength, no pain. I am okay with him going back to bowling though he will start slowly, he will warm up and stretch appropriately prior to sessions and wear his compression sleeve. Return as needed.

## 2019-03-19 ENCOUNTER — Encounter: Payer: Self-pay | Admitting: Rehabilitative and Restorative Service Providers"

## 2019-03-19 NOTE — Therapy (Signed)
[facility] [address] [place], [place], [address] Phone: [phone]   Fax:  [phone]  Patient Details  Name: Jeremy Johnston MRN: [id number] Date of Birth: 05/19/1950 Referring Provider:  Thekkekandam, Thomas J, MD  Encounter Date: last treatment date 03/10/2019  PHYSICAL THERAPY DISCHARGE SUMMARY  Visits from Start of Care: 5  Current functional level related to goals / functional outcomes: PT Long Term Goals - 03/10/19 0731      PT LONG TERM GOAL #1   Title  The patient will be indep with progression of HEP.    Time  8    Period  Weeks      PT LONG TERM GOAL #2   Title  The patient will be < or equal to 25% limited per FOTO to demo return to functional tasks.    Time  8    Period  Weeks      PT LONG TERM GOAL #3   Title  The patient will be able to tolerate casting fishing line to return to recreational activities.    Time  8    Period  Weeks    Status  Achieved      PT LONG TERM GOAL #4   Title  The patient will return to bowling without pain/swelling R elbow.    Time  8    Period  Weeks      The patient did not return for last visit due to being cleared to return to bowling.  He has HEP and was progressing to return to sport specific activities.   Remaining deficits: Weakness in R UE addressed through progression of HEP   Education / Equipment: HEP, return to bowling  Plan: Patient agrees to discharge.  Patient goals were partially met. Patient is being discharged due to being pleased with the current functional level.  ?????     Thank you for the referral of this patient. Christina Weaver, MPT   WEAVER,CHRISTINA 03/19/2019, 7:40 AM  [facility] [address] , [place], [address] Phone: [phone]   Fax:  [phone] 

## 2019-03-20 ENCOUNTER — Encounter: Payer: Self-pay | Admitting: Rehabilitative and Restorative Service Providers"

## 2019-04-09 ENCOUNTER — Encounter (INDEPENDENT_AMBULATORY_CARE_PROVIDER_SITE_OTHER): Payer: Medicare Other | Admitting: Ophthalmology

## 2019-04-09 DIAGNOSIS — H35033 Hypertensive retinopathy, bilateral: Secondary | ICD-10-CM

## 2019-04-09 DIAGNOSIS — I10 Essential (primary) hypertension: Secondary | ICD-10-CM

## 2019-04-09 DIAGNOSIS — E113513 Type 2 diabetes mellitus with proliferative diabetic retinopathy with macular edema, bilateral: Secondary | ICD-10-CM

## 2019-04-09 DIAGNOSIS — E11311 Type 2 diabetes mellitus with unspecified diabetic retinopathy with macular edema: Secondary | ICD-10-CM

## 2019-04-09 DIAGNOSIS — H43813 Vitreous degeneration, bilateral: Secondary | ICD-10-CM

## 2019-05-07 ENCOUNTER — Encounter (INDEPENDENT_AMBULATORY_CARE_PROVIDER_SITE_OTHER): Payer: Medicare Other | Admitting: Ophthalmology

## 2019-05-07 ENCOUNTER — Other Ambulatory Visit: Payer: Self-pay

## 2019-05-07 DIAGNOSIS — E113513 Type 2 diabetes mellitus with proliferative diabetic retinopathy with macular edema, bilateral: Secondary | ICD-10-CM

## 2019-05-07 DIAGNOSIS — H43813 Vitreous degeneration, bilateral: Secondary | ICD-10-CM

## 2019-05-07 DIAGNOSIS — I10 Essential (primary) hypertension: Secondary | ICD-10-CM | POA: Diagnosis not present

## 2019-05-07 DIAGNOSIS — E11311 Type 2 diabetes mellitus with unspecified diabetic retinopathy with macular edema: Secondary | ICD-10-CM | POA: Diagnosis not present

## 2019-05-07 DIAGNOSIS — H35033 Hypertensive retinopathy, bilateral: Secondary | ICD-10-CM

## 2019-06-04 ENCOUNTER — Other Ambulatory Visit: Payer: Self-pay

## 2019-06-04 ENCOUNTER — Encounter (INDEPENDENT_AMBULATORY_CARE_PROVIDER_SITE_OTHER): Payer: Medicare Other | Admitting: Ophthalmology

## 2019-06-04 DIAGNOSIS — E11311 Type 2 diabetes mellitus with unspecified diabetic retinopathy with macular edema: Secondary | ICD-10-CM

## 2019-06-04 DIAGNOSIS — H35033 Hypertensive retinopathy, bilateral: Secondary | ICD-10-CM

## 2019-06-04 DIAGNOSIS — E113513 Type 2 diabetes mellitus with proliferative diabetic retinopathy with macular edema, bilateral: Secondary | ICD-10-CM

## 2019-06-04 DIAGNOSIS — H43813 Vitreous degeneration, bilateral: Secondary | ICD-10-CM

## 2019-06-04 DIAGNOSIS — I10 Essential (primary) hypertension: Secondary | ICD-10-CM | POA: Diagnosis not present

## 2019-07-09 ENCOUNTER — Encounter (INDEPENDENT_AMBULATORY_CARE_PROVIDER_SITE_OTHER): Payer: Medicare Other | Admitting: Ophthalmology

## 2019-07-11 ENCOUNTER — Encounter (INDEPENDENT_AMBULATORY_CARE_PROVIDER_SITE_OTHER): Payer: Medicare Other | Admitting: Ophthalmology

## 2019-07-11 DIAGNOSIS — E113513 Type 2 diabetes mellitus with proliferative diabetic retinopathy with macular edema, bilateral: Secondary | ICD-10-CM | POA: Diagnosis not present

## 2019-07-11 DIAGNOSIS — E11311 Type 2 diabetes mellitus with unspecified diabetic retinopathy with macular edema: Secondary | ICD-10-CM

## 2019-07-11 DIAGNOSIS — H35033 Hypertensive retinopathy, bilateral: Secondary | ICD-10-CM

## 2019-07-11 DIAGNOSIS — I10 Essential (primary) hypertension: Secondary | ICD-10-CM

## 2019-07-11 DIAGNOSIS — H43813 Vitreous degeneration, bilateral: Secondary | ICD-10-CM

## 2019-08-21 ENCOUNTER — Other Ambulatory Visit: Payer: Self-pay

## 2019-08-21 ENCOUNTER — Encounter (INDEPENDENT_AMBULATORY_CARE_PROVIDER_SITE_OTHER): Payer: Medicare Other | Admitting: Ophthalmology

## 2019-08-21 DIAGNOSIS — E113513 Type 2 diabetes mellitus with proliferative diabetic retinopathy with macular edema, bilateral: Secondary | ICD-10-CM

## 2019-08-21 DIAGNOSIS — H35033 Hypertensive retinopathy, bilateral: Secondary | ICD-10-CM

## 2019-08-21 DIAGNOSIS — E11311 Type 2 diabetes mellitus with unspecified diabetic retinopathy with macular edema: Secondary | ICD-10-CM | POA: Diagnosis not present

## 2019-08-21 DIAGNOSIS — I10 Essential (primary) hypertension: Secondary | ICD-10-CM | POA: Diagnosis not present

## 2019-08-21 DIAGNOSIS — H43813 Vitreous degeneration, bilateral: Secondary | ICD-10-CM

## 2019-08-22 ENCOUNTER — Encounter (INDEPENDENT_AMBULATORY_CARE_PROVIDER_SITE_OTHER): Payer: Medicare Other | Admitting: Ophthalmology

## 2019-09-03 ENCOUNTER — Other Ambulatory Visit: Payer: Self-pay

## 2019-09-03 MED ORDER — EZETIMIBE 10 MG PO TABS: 10.0000 mg | ORAL_TABLET | Freq: Every day | ORAL | 0 refills | Status: DC

## 2019-09-03 NOTE — Telephone Encounter (Signed)
Pt's medication was sent to pt's pharmacy as requested. Confirmation received.  °

## 2019-10-01 ENCOUNTER — Encounter (INDEPENDENT_AMBULATORY_CARE_PROVIDER_SITE_OTHER): Payer: Medicare Other | Admitting: Ophthalmology

## 2019-10-01 ENCOUNTER — Other Ambulatory Visit: Payer: Self-pay

## 2019-10-01 DIAGNOSIS — I10 Essential (primary) hypertension: Secondary | ICD-10-CM | POA: Diagnosis not present

## 2019-10-01 DIAGNOSIS — E113513 Type 2 diabetes mellitus with proliferative diabetic retinopathy with macular edema, bilateral: Secondary | ICD-10-CM | POA: Diagnosis not present

## 2019-10-01 DIAGNOSIS — E11311 Type 2 diabetes mellitus with unspecified diabetic retinopathy with macular edema: Secondary | ICD-10-CM

## 2019-10-01 DIAGNOSIS — H35033 Hypertensive retinopathy, bilateral: Secondary | ICD-10-CM | POA: Diagnosis not present

## 2019-10-01 DIAGNOSIS — H43813 Vitreous degeneration, bilateral: Secondary | ICD-10-CM

## 2019-10-28 ENCOUNTER — Encounter (HOSPITAL_COMMUNITY): Payer: Medicare Other

## 2019-10-29 ENCOUNTER — Other Ambulatory Visit (HOSPITAL_COMMUNITY): Payer: Self-pay | Admitting: Interventional Cardiology

## 2019-10-29 ENCOUNTER — Ambulatory Visit (HOSPITAL_COMMUNITY)
Admission: RE | Admit: 2019-10-29 | Discharge: 2019-10-29 | Disposition: A | Discharge status: Home / Self Care | Payer: Medicare Other | Source: Ambulatory Visit | Attending: Cardiology | Admitting: Cardiology

## 2019-10-29 ENCOUNTER — Other Ambulatory Visit: Payer: Self-pay

## 2019-10-29 DIAGNOSIS — I6523 Occlusion and stenosis of bilateral carotid arteries: Secondary | ICD-10-CM | POA: Insufficient documentation

## 2019-10-31 ENCOUNTER — Telehealth: Payer: Self-pay | Admitting: *Deleted

## 2019-10-31 DIAGNOSIS — I6523 Occlusion and stenosis of bilateral carotid arteries: Secondary | ICD-10-CM

## 2019-10-31 NOTE — Telephone Encounter (Signed)
-----   Message from Jettie Booze, MD sent at 10/30/2019 11:25 PM EDT ----- Moderate LICA stenosis.  Repeat study in one year.

## 2019-10-31 NOTE — Telephone Encounter (Signed)
Pt has been notified of carotid results by phone with verbal understanding. Advised pt to continue current Tx plan and watch diet for fried, fatty foods and salt intake. Pt thanked me for the call. Pt is agreeable to plan of care to repeat carotids in 1 yr. Order has been placed. Pt states he had lab done recently with PCP. I asked pt to be please have those faxed over to Dr. Irish Lack for his chart as well. Pt states she had a complete BMP panel and cholesterol was checked.  Patient notified of result.  Please refer to phone note from today for complete details.   Julaine Hua, [place] 10/31/2019 9:24 AM

## 2019-11-14 ENCOUNTER — Other Ambulatory Visit: Payer: Self-pay

## 2019-11-14 ENCOUNTER — Encounter (INDEPENDENT_AMBULATORY_CARE_PROVIDER_SITE_OTHER): Payer: Medicare Other | Admitting: Ophthalmology

## 2019-11-14 DIAGNOSIS — H35033 Hypertensive retinopathy, bilateral: Secondary | ICD-10-CM | POA: Diagnosis not present

## 2019-11-14 DIAGNOSIS — H43813 Vitreous degeneration, bilateral: Secondary | ICD-10-CM

## 2019-11-14 DIAGNOSIS — I10 Essential (primary) hypertension: Secondary | ICD-10-CM | POA: Diagnosis not present

## 2019-11-14 DIAGNOSIS — E113513 Type 2 diabetes mellitus with proliferative diabetic retinopathy with macular edema, bilateral: Secondary | ICD-10-CM

## 2019-11-14 DIAGNOSIS — E11311 Type 2 diabetes mellitus with unspecified diabetic retinopathy with macular edema: Secondary | ICD-10-CM | POA: Diagnosis not present

## 2019-12-19 ENCOUNTER — Other Ambulatory Visit: Payer: Self-pay

## 2019-12-19 ENCOUNTER — Encounter (INDEPENDENT_AMBULATORY_CARE_PROVIDER_SITE_OTHER): Payer: Medicare Other | Admitting: Ophthalmology

## 2019-12-19 DIAGNOSIS — I10 Essential (primary) hypertension: Secondary | ICD-10-CM | POA: Diagnosis not present

## 2019-12-19 DIAGNOSIS — E11311 Type 2 diabetes mellitus with unspecified diabetic retinopathy with macular edema: Secondary | ICD-10-CM

## 2019-12-19 DIAGNOSIS — H35033 Hypertensive retinopathy, bilateral: Secondary | ICD-10-CM | POA: Diagnosis not present

## 2019-12-19 DIAGNOSIS — H43811 Vitreous degeneration, right eye: Secondary | ICD-10-CM

## 2019-12-19 DIAGNOSIS — E113513 Type 2 diabetes mellitus with proliferative diabetic retinopathy with macular edema, bilateral: Secondary | ICD-10-CM | POA: Diagnosis not present

## 2019-12-29 ENCOUNTER — Other Ambulatory Visit: Payer: Self-pay | Admitting: Interventional Cardiology

## 2020-01-16 ENCOUNTER — Encounter (INDEPENDENT_AMBULATORY_CARE_PROVIDER_SITE_OTHER): Payer: Medicare Other | Admitting: Ophthalmology

## 2020-01-26 ENCOUNTER — Encounter (INDEPENDENT_AMBULATORY_CARE_PROVIDER_SITE_OTHER): Payer: Medicare Other | Admitting: Ophthalmology

## 2020-01-26 ENCOUNTER — Other Ambulatory Visit: Payer: Self-pay

## 2020-01-26 DIAGNOSIS — H35033 Hypertensive retinopathy, bilateral: Secondary | ICD-10-CM

## 2020-01-26 DIAGNOSIS — H43811 Vitreous degeneration, right eye: Secondary | ICD-10-CM

## 2020-01-26 DIAGNOSIS — I10 Essential (primary) hypertension: Secondary | ICD-10-CM

## 2020-01-26 DIAGNOSIS — E11311 Type 2 diabetes mellitus with unspecified diabetic retinopathy with macular edema: Secondary | ICD-10-CM | POA: Diagnosis not present

## 2020-01-26 DIAGNOSIS — E113513 Type 2 diabetes mellitus with proliferative diabetic retinopathy with macular edema, bilateral: Secondary | ICD-10-CM | POA: Diagnosis not present

## 2020-02-03 NOTE — Progress Notes (Signed)
Cardiology Office Note   Date:  02/04/2020   ID:  Jeremy Johnston, DOB [birth year], MRN [id number]  PCP:  Ivan Anchors, MD    No chief complaint on file.  CAD  Wt Readings from Last 3 Encounters:  02/04/20 168 lb 9.6 oz (76.5 kg)  03/13/19 171 lb (77.6 kg)  02/11/19 166 lb (75.3 kg)       History of Present Illness: Jeremy Johnston is a 69 y.o. male  who has had DM and CAD. DM diagnosed in 1986.He had CABG in 2008. He was essentially asypmtomatic at the time of his CABG.Change on routine ECG in 2008 prompted stress testing and w/u that led to CABG.  He had a stress test in 2011 with a small inferior defect. He had a ETT in 2014 which was unremarkable per his report. He is alternating nuclear tests and ETT on an annual basis.   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 He had an ETT (9 min on treadmill) in 6/16 which was low risk. 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  Normal ETT in 8/17.   2020 stress test showed: "Nuclear stress EF: 60%.  Blood pressure demonstrated a normal response to exercise.  There was no ST segment deviation noted during stress.  Defect 1: There is a large defect of moderate severity present in the basal inferior, basal inferolateral, mid inferior, mid inferolateral, apical inferior and apical lateral location.  Findings consistent with prior myocardial infarction inferiorly with peri-infarct ischemia in the mid to apical inferolateral region.  This is a low risk study.  The left ventricular ejection fraction is normal (55-65%)."  Tore biceps tendon in 2020 while bowling.  He still does these exercises.   Since the last visit, ha shad some trouble swallowing.  GERD sx as well, which improve with standing or shifting to a vertical position.  Denies : Chest pain. Dizziness. Leg edema. Nitroglycerin use. Orthopnea. Palpitations. Paroxysmal nocturnal dyspnea.  Shortness of breath. Syncope.   Bowls 4 days/week. He walks on the treadmill other days.  He will go at 2.5-3 mph for 40 minutes.    BP at home tends to be lower.  Better with lower dose quinapril.    Past Medical History:  Diagnosis Date  . Cancer ([facility])    kidney cancer-both kidneys remain  . Chronic kidney disease    Robotic Kidney surgery for kidney cancer tumor-kidney remains-  . Coronary artery disease   . Diabetes mellitus (HCC)    Insulin pump -[name], and has a glucose transmitter attached.  . Diabetic retinopathy ([facility])    left eye  . Hyperlipidemia   . Hypertension   . Sciatica   . Vitreous hemorrhage ([facility])    left eye    Past Surgical History:  Procedure Laterality Date  . CATARACT EXTRACTION, BILATERAL Bilateral    11'15  . CORONARY ARTERY BYPASS GRAFT  10/08   x4 vessels([place])  . DIAGNOSTIC LAPAROSCOPY    . EYE SURGERY     retina peele  . HERNIA REPAIR    . KIDNEY SURGERY     tumor removed  . PARS PLANA VITRECTOMY 27 GAUGE Left 12/12/2016   Procedure: PARS PLANA VITRECTOMY 75 GAUGE , ENDOLASER GAS/ FLUID EXCHAGNE;  Surgeon: Hayden Pedro, MD;  Location: [facility];  Service: Ophthalmology;  Laterality: Left;  Marland Kitchen VASECTOMY       Current  Outpatient Medications  Medication Sig Dispense Refill  . Aflibercept (EYLEA IO) Inject into the eye.    Marland Kitchen Besifloxacin HCl (BESIVANCE OP) Place 1 drop into the left eye 4 (four) times daily. The day of and 3-4 days after eye injection every 6 weeks    . brimonidine (ALPHAGAN) 0.15 % ophthalmic solution Place 1 drop into the right eye 2 (two) times daily.  11  . clopidogrel (PLAVIX) 75 MG tablet Take 1 tablet (75 mg total) by mouth daily with breakfast. 90 tablet 3  . dorzolamide-timolol (COSOPT) 22.3-6.8 MG/ML ophthalmic solution Place 1 drop into the right eye 2 (two) times daily.    Marland Kitchen ezetimibe (ZETIA) 10 MG tablet TAKE ONE TABLET BY MOUTH DAILY. PLEASE MAKE YEARLY APPOINTMENT FOR FUTURE REFILLS 30 tablet 0  . famotidine  (PEPCID) 20 MG tablet Take 20 mg by mouth daily as needed for heartburn or indigestion.    . Insulin Human (INSULIN PUMP) 100 unit/ml SOLN Inject into the skin. Use with insulin as directed    . loratadine (CLARITIN) 10 MG tablet Take 10 mg by mouth daily as needed for allergies.    . Multiple Vitamin (MULTIVITAMIN WITH MINERALS) TABS tablet Take 1 tablet by mouth 2 (two) times a week.     Marland Kitchen NOVOLOG 100 UNIT/ML injection Inject 1 Units into the skin continuous. Basal rate 1 unit per hour, may vary.  Bolus 1 unit per 10g of carbs    . quinapril (ACCUPRIL) 5 MG tablet Take 1 tablet (5 mg total) by mouth daily. 90 tablet 3  . rosuvastatin (CRESTOR) 40 MG tablet Take 1 tablet (40 mg total) by mouth at bedtime. 90 tablet 3   No current facility-administered medications for this visit.    Allergies:   Patient has no known allergies.    Social History:  The patient  reports that he has never smoked. He has never used smokeless tobacco. He reports that he does not drink alcohol and does not use drugs.   Family History:  The patient's family history includes Diabetes in his sister; Heart attack in his father and sister.    ROS:  Please see the history of present illness.   Otherwise, review of systems are positive for GERD sx.   All other systems are reviewed and negative.    PHYSICAL EXAM: VS:  BP (!) 144/62   Pulse 81   Ht 5\' 7"  (1.702 m)   Wt 168 lb 9.6 oz (76.5 kg)   BMI 26.41 kg/m  , BMI Body mass index is 26.41 kg/m. GEN: Well nourished, well developed, in no acute distress  HEENT: normal  Neck: no JVD, carotid bruits, or masses Cardiac: RRR; no murmurs, rubs, or gallops,no edema  Respiratory:  clear to auscultation bilaterally, normal work of breathing GI: soft, nontender, nondistended, + BS MS: no deformity or atrophy  Skin: warm and dry, no rash Neuro:  Strength and sensation are intact Psych: euthymic mood, full affect   EKG:   The ekg ordered today demonstrates NSR,  inferior Q waves, no ST changes   Recent Labs: No results found for requested labs within last 8760 hours.   Lipid Panel    Component Value Date/Time   CHOL 127 10/25/2017 0842   TRIG 58 10/25/2017 0842   HDL 40 10/25/2017 0842   CHOLHDL 3.2 10/25/2017 0842   LDLCALC 75 10/25/2017 0842     Other studies Reviewed: Additional studies/ records that were reviewed today with results demonstrating: labs results reviewed  A1C 6.9, LDL 85.   ASSESSMENT AND PLAN:  1. CAD: Continue aggressive secondary prevention.  Stress test in 2020 did not show reversible ischemia.  Continue regular activity. 2. DM: A1C 6.9.  Whole food, plant based diet.  Insulin pump.  3. Hyperlipidemia: LDL 85. Continue rosuvastatin.   4. Hypertensive heart disease: The current medical regimen is effective;  continue present plan and medications.  5. Carotid artery disease: Moderate left-sided disease.  Continue with annual carotid Dopplers.   Current medicines are reviewed at length with the patient today.  The patient concerns regarding his medicines were addressed.  The following changes have been made:  No change  Labs/ tests ordered today include:  No orders of the defined types were placed in this encounter.   Recommend 150 minutes/week of aerobic exercise Low fat, low carb, high fiber diet recommended  Disposition:   FU in 1 year   Signed, Larae Grooms, MD  02/04/2020 9:29 AM    [facility] [address], [place], [place]  [address] Phone: [phone]; Fax: [phone]

## 2020-02-04 ENCOUNTER — Other Ambulatory Visit: Payer: Self-pay

## 2020-02-04 ENCOUNTER — Ambulatory Visit (INDEPENDENT_AMBULATORY_CARE_PROVIDER_SITE_OTHER): Payer: Medicare Other | Admitting: Interventional Cardiology

## 2020-02-04 ENCOUNTER — Encounter: Payer: Self-pay | Admitting: Interventional Cardiology

## 2020-02-04 VITALS — BP 144/62 | HR 81 | Ht 67.0 in | Wt 168.6 lb

## 2020-02-04 DIAGNOSIS — I6523 Occlusion and stenosis of bilateral carotid arteries: Secondary | ICD-10-CM | POA: Diagnosis not present

## 2020-02-04 DIAGNOSIS — E1159 Type 2 diabetes mellitus with other circulatory complications: Secondary | ICD-10-CM

## 2020-02-04 DIAGNOSIS — I25119 Atherosclerotic heart disease of native coronary artery with unspecified angina pectoris: Secondary | ICD-10-CM | POA: Diagnosis not present

## 2020-02-04 DIAGNOSIS — E782 Mixed hyperlipidemia: Secondary | ICD-10-CM

## 2020-02-04 DIAGNOSIS — I10 Essential (primary) hypertension: Secondary | ICD-10-CM | POA: Diagnosis not present

## 2020-02-04 MED ORDER — QUINAPRIL HCL 5 MG PO TABS: 5.0000 mg | ORAL_TABLET | Freq: Every day | ORAL | 3 refills | Status: DC

## 2020-02-04 MED ORDER — CLOPIDOGREL BISULFATE 75 MG PO TABS
75.0000 mg | ORAL_TABLET | Freq: Every day | ORAL | 3 refills | Status: DC
Start: 2020-02-04 — End: 2021-02-07

## 2020-02-04 MED ORDER — EZETIMIBE 10 MG PO TABS: 10.0000 mg | ORAL_TABLET | Freq: Every day | ORAL | 3 refills | Status: DC

## 2020-02-04 MED ORDER — ROSUVASTATIN CALCIUM 40 MG PO TABS
40.0000 mg | ORAL_TABLET | Freq: Every day | ORAL | 3 refills | Status: DC
Start: 2020-02-04 — End: 2021-02-07

## 2020-02-04 NOTE — Patient Instructions (Signed)
Medication Instructions:  Your physician recommends that you continue on your current medications as directed. Please refer to the Current Medication list given to you today.  *If you need a refill on your cardiac medications before your next appointment, please call your pharmacy*   Lab Work: None If you have labs (blood work) drawn today and your tests are completely normal, you will receive your results only by: Marland Kitchen MyChart Message (if you have MyChart) OR . A paper copy in the mail If you have any lab test that is abnormal or we need to change your treatment, we will call you to review the results.   Follow-Up: At [facility], you and your health needs are our priority.  As part of our continuing mission to provide you with exceptional heart care, we have created designated Provider Care Teams.  These Care Teams include your primary Cardiologist (physician) and Advanced Practice Providers (APPs -  Physician Assistants and Nurse Practitioners) who all work together to provide you with the care you need, when you need it.  Your next appointment:   1 year(s)  The format for your next appointment:   In Person  Provider:   You may see Larae Grooms, MD or one of the following Advanced Practice Providers on your designated Care Team:    Melina Copa, PA-C  Ermalinda Barrios, PA-C    Other Instructions  High-Fiber Diet Fiber, also called dietary fiber, is a type of carbohydrate that is found in fruits, vegetables, whole grains, and beans. A high-fiber diet can have many health benefits. Your health care provider may recommend a high-fiber diet to help:  Prevent constipation. Fiber can make your bowel movements more regular.  Lower your cholesterol.  Relieve the following conditions: ? Swelling of veins in the anus (hemorrhoids). ? Swelling and irritation (inflammation) of specific areas of the digestive tract (uncomplicated diverticulosis). ? A problem of the large intestine  (colon) that sometimes causes pain and diarrhea (irritable bowel syndrome, IBS).  Prevent overeating as part of a weight-loss plan.  Prevent heart disease, type 2 diabetes, and certain cancers. What is my plan? The recommended daily fiber intake in grams (g) includes:  38 g for men age 21 or younger.  30 g for men over age 55.  25 g for women age 29 or younger.  21 g for women over age 36. You can get the recommended daily intake of dietary fiber by:  Eating a variety of fruits, vegetables, grains, and beans.  Taking a fiber supplement, if it is not possible to get enough fiber through your diet. What do I need to know about a high-fiber diet?  It is better to get fiber through food sources rather than from fiber supplements. There is not a lot of research about how effective supplements are.  Always check the fiber content on the nutrition facts label of any prepackaged food. Look for foods that contain 5 g of fiber or more per serving.  Talk with a diet and nutrition specialist (dietitian) if you have questions about specific foods that are recommended or not recommended for your medical condition, especially if those foods are not listed below.  Gradually increase how much fiber you consume. If you increase your intake of dietary fiber too quickly, you may have bloating, cramping, or gas.  Drink plenty of water. Water helps you to digest fiber. What are tips for following this plan?  Eat a wide variety of high-fiber foods.  Make sure that half  of the grains that you eat each day are whole grains.  Eat breads and cereals that are made with whole-grain flour instead of refined flour or white flour.  Eat brown rice, bulgur wheat, or millet instead of white rice.  Start the day with a breakfast that is high in fiber, such as a cereal that contains 5 g of fiber or more per serving.  Use beans in place of meat in soups, salads, and pasta dishes.  Eat high-fiber snacks, such  as berries, raw vegetables, nuts, and popcorn.  Choose whole fruits and vegetables instead of processed forms like juice or sauce. What foods can I eat?  Fruits Berries. Pears. Apples. Oranges. Avocado. Prunes and raisins. Dried figs. Vegetables Sweet potatoes. Spinach. Kale. Artichokes. Cabbage. Broccoli. Cauliflower. Green peas. Carrots. Squash. Grains Whole-grain breads. Multigrain cereal. Oats and oatmeal. Brown rice. Barley. Bulgur wheat. [place]. Quinoa. Bran muffins. Popcorn. Rye wafer crackers. Meats and other proteins Navy, kidney, and pinto beans. Soybeans. Split peas. Lentils. Nuts and seeds. Dairy Fiber-fortified yogurt. Beverages Fiber-fortified soy milk. Fiber-fortified orange juice. Other foods Fiber bars. The items listed above may not be a complete list of recommended foods and beverages. Contact a dietitian for more options. What foods are not recommended? Fruits Fruit juice. Cooked, strained fruit. Vegetables Fried potatoes. Canned vegetables. Well-cooked vegetables. Grains White bread. Pasta made with refined flour. White rice. Meats and other proteins Fatty cuts of meat. Fried chicken or fried fish. Dairy Milk. Yogurt. Cream cheese. Sour cream. Fats and oils Butters. Beverages Soft drinks. Other foods Cakes and pastries. The items listed above may not be a complete list of foods and beverages to avoid. Contact a dietitian for more information. Summary  Fiber is a type of carbohydrate. It is found in fruits, vegetables, whole grains, and beans.  There are many health benefits of eating a high-fiber diet, such as preventing constipation, lowering blood cholesterol, helping with weight loss, and reducing your risk of heart disease, diabetes, and certain cancers.  Gradually increase your intake of fiber. Increasing too fast can result in cramping, bloating, and gas. Drink plenty of water while you increase your fiber.  The best sources of fiber include  whole fruits and vegetables, whole grains, nuts, seeds, and beans. This information is not intended to replace advice given to you by your health care provider. Make sure you discuss any questions you have with your health care provider. Document Revised: 01/22/2017 Document Reviewed: 01/22/2017 Elsevier Patient Education  2020 [employer].

## 2020-03-01 ENCOUNTER — Other Ambulatory Visit: Payer: Self-pay

## 2020-03-01 ENCOUNTER — Encounter (INDEPENDENT_AMBULATORY_CARE_PROVIDER_SITE_OTHER): Payer: Medicare Other | Admitting: Ophthalmology

## 2020-03-01 DIAGNOSIS — I10 Essential (primary) hypertension: Secondary | ICD-10-CM | POA: Diagnosis not present

## 2020-03-01 DIAGNOSIS — E11311 Type 2 diabetes mellitus with unspecified diabetic retinopathy with macular edema: Secondary | ICD-10-CM | POA: Diagnosis not present

## 2020-03-01 DIAGNOSIS — H43811 Vitreous degeneration, right eye: Secondary | ICD-10-CM

## 2020-03-01 DIAGNOSIS — H35033 Hypertensive retinopathy, bilateral: Secondary | ICD-10-CM | POA: Diagnosis not present

## 2020-03-01 DIAGNOSIS — E113513 Type 2 diabetes mellitus with proliferative diabetic retinopathy with macular edema, bilateral: Secondary | ICD-10-CM | POA: Diagnosis not present

## 2020-04-05 ENCOUNTER — Encounter (INDEPENDENT_AMBULATORY_CARE_PROVIDER_SITE_OTHER): Payer: Medicare Other | Admitting: Ophthalmology

## 2020-04-09 ENCOUNTER — Other Ambulatory Visit: Payer: Self-pay

## 2020-04-09 ENCOUNTER — Encounter (INDEPENDENT_AMBULATORY_CARE_PROVIDER_SITE_OTHER): Payer: Medicare Other | Admitting: Ophthalmology

## 2020-04-09 DIAGNOSIS — I10 Essential (primary) hypertension: Secondary | ICD-10-CM

## 2020-04-09 DIAGNOSIS — H43811 Vitreous degeneration, right eye: Secondary | ICD-10-CM | POA: Diagnosis not present

## 2020-04-09 DIAGNOSIS — E113513 Type 2 diabetes mellitus with proliferative diabetic retinopathy with macular edema, bilateral: Secondary | ICD-10-CM | POA: Diagnosis not present

## 2020-04-09 DIAGNOSIS — H35033 Hypertensive retinopathy, bilateral: Secondary | ICD-10-CM | POA: Diagnosis not present

## 2020-04-09 LAB — HM DIABETES EYE EXAM

## 2020-05-17 ENCOUNTER — Other Ambulatory Visit: Payer: Self-pay

## 2020-05-17 ENCOUNTER — Encounter (INDEPENDENT_AMBULATORY_CARE_PROVIDER_SITE_OTHER): Payer: Medicare Other | Admitting: Ophthalmology

## 2020-05-17 DIAGNOSIS — I10 Essential (primary) hypertension: Secondary | ICD-10-CM

## 2020-05-17 DIAGNOSIS — H35033 Hypertensive retinopathy, bilateral: Secondary | ICD-10-CM | POA: Diagnosis not present

## 2020-05-17 DIAGNOSIS — E113513 Type 2 diabetes mellitus with proliferative diabetic retinopathy with macular edema, bilateral: Secondary | ICD-10-CM

## 2020-05-17 DIAGNOSIS — H43811 Vitreous degeneration, right eye: Secondary | ICD-10-CM | POA: Diagnosis not present

## 2020-06-09 ENCOUNTER — Ambulatory Visit (INDEPENDENT_AMBULATORY_CARE_PROVIDER_SITE_OTHER): Payer: Medicare Other | Admitting: Podiatry

## 2020-06-09 ENCOUNTER — Other Ambulatory Visit: Payer: Self-pay

## 2020-06-09 ENCOUNTER — Encounter: Payer: Self-pay | Admitting: Podiatry

## 2020-06-09 DIAGNOSIS — I999 Unspecified disorder of circulatory system: Secondary | ICD-10-CM | POA: Diagnosis not present

## 2020-06-09 DIAGNOSIS — M79675 Pain in left toe(s): Secondary | ICD-10-CM | POA: Diagnosis not present

## 2020-06-09 DIAGNOSIS — L600 Ingrowing nail: Secondary | ICD-10-CM

## 2020-06-09 DIAGNOSIS — B351 Tinea unguium: Secondary | ICD-10-CM | POA: Diagnosis not present

## 2020-06-09 NOTE — Progress Notes (Signed)
Subjective:   Patient ID: Jeremy Johnston, male   DOB: 70 y.o.   MRN: [id number]   HPI Patient states he has a thick deformed left hallux nail that is becoming sore.  Patient had it worked on previously but it is been more of a problem he does have a significant history of trauma around that nail bed.  Patient does not smoke it is reasonable health and tries to be active and states he is able to do activities without cramping or pain   Review of Systems  All other systems reviewed and are negative.       Objective:  Physical Exam Vitals and nursing note reviewed.  Constitutional:      Appearance: He is well-developed and well-nourished.  Cardiovascular:     Pulses: Intact distal pulses.  Pulmonary:     Effort: Pulmonary effort is normal.  Musculoskeletal:        General: Normal range of motion.  Skin:    General: Skin is warm.  Neurological:     Mental Status: He is alert.     Neurovascular status indicates diminishment of pulses PT DP left over right with cap fill that is normal.  Patient does have good neurological sensation and has a severely thickened hallux nail left that is dystrophic and moderately tender     Assessment:  Possibility for vascular disease even though he has no claudication symptomatology with appropriate damage left hallux nail that is comfortable     Plan:  H&P reviewed condition and due to the fact I could not feel his pulses adequately I just went ahead and debrided today explaining that this can be done and that nail removal would require circulatory studies before we would do this.  Patient will be seen back for routine care as needed encouraged to call with questions

## 2020-06-21 ENCOUNTER — Encounter (INDEPENDENT_AMBULATORY_CARE_PROVIDER_SITE_OTHER): Payer: Medicare Other | Admitting: Ophthalmology

## 2020-06-24 ENCOUNTER — Other Ambulatory Visit: Payer: Self-pay

## 2020-06-24 ENCOUNTER — Encounter (INDEPENDENT_AMBULATORY_CARE_PROVIDER_SITE_OTHER): Payer: Medicare Other | Admitting: Ophthalmology

## 2020-06-24 DIAGNOSIS — H35033 Hypertensive retinopathy, bilateral: Secondary | ICD-10-CM | POA: Diagnosis not present

## 2020-06-24 DIAGNOSIS — H43811 Vitreous degeneration, right eye: Secondary | ICD-10-CM

## 2020-06-24 DIAGNOSIS — E113513 Type 2 diabetes mellitus with proliferative diabetic retinopathy with macular edema, bilateral: Secondary | ICD-10-CM

## 2020-06-24 DIAGNOSIS — I10 Essential (primary) hypertension: Secondary | ICD-10-CM | POA: Diagnosis not present

## 2020-06-25 ENCOUNTER — Encounter (INDEPENDENT_AMBULATORY_CARE_PROVIDER_SITE_OTHER): Payer: Medicare Other | Admitting: Ophthalmology

## 2020-07-29 ENCOUNTER — Other Ambulatory Visit: Payer: Self-pay

## 2020-07-29 ENCOUNTER — Encounter (INDEPENDENT_AMBULATORY_CARE_PROVIDER_SITE_OTHER): Payer: Medicare Other | Admitting: Ophthalmology

## 2020-07-29 DIAGNOSIS — E113313 Type 2 diabetes mellitus with moderate nonproliferative diabetic retinopathy with macular edema, bilateral: Secondary | ICD-10-CM | POA: Diagnosis not present

## 2020-07-29 DIAGNOSIS — H35033 Hypertensive retinopathy, bilateral: Secondary | ICD-10-CM

## 2020-07-29 DIAGNOSIS — H43811 Vitreous degeneration, right eye: Secondary | ICD-10-CM | POA: Diagnosis not present

## 2020-07-29 DIAGNOSIS — I10 Essential (primary) hypertension: Secondary | ICD-10-CM | POA: Diagnosis not present

## 2020-09-02 ENCOUNTER — Other Ambulatory Visit: Payer: Self-pay

## 2020-09-02 ENCOUNTER — Encounter (INDEPENDENT_AMBULATORY_CARE_PROVIDER_SITE_OTHER): Payer: Medicare Other | Admitting: Ophthalmology

## 2020-09-02 DIAGNOSIS — I10 Essential (primary) hypertension: Secondary | ICD-10-CM

## 2020-09-02 DIAGNOSIS — H35033 Hypertensive retinopathy, bilateral: Secondary | ICD-10-CM | POA: Diagnosis not present

## 2020-09-02 DIAGNOSIS — H43811 Vitreous degeneration, right eye: Secondary | ICD-10-CM | POA: Diagnosis not present

## 2020-09-02 DIAGNOSIS — E113513 Type 2 diabetes mellitus with proliferative diabetic retinopathy with macular edema, bilateral: Secondary | ICD-10-CM

## 2020-10-15 ENCOUNTER — Other Ambulatory Visit: Payer: Self-pay

## 2020-10-15 ENCOUNTER — Encounter (INDEPENDENT_AMBULATORY_CARE_PROVIDER_SITE_OTHER): Payer: Medicare Other | Admitting: Ophthalmology

## 2020-10-15 DIAGNOSIS — H43811 Vitreous degeneration, right eye: Secondary | ICD-10-CM

## 2020-10-15 DIAGNOSIS — E113513 Type 2 diabetes mellitus with proliferative diabetic retinopathy with macular edema, bilateral: Secondary | ICD-10-CM | POA: Diagnosis not present

## 2020-10-15 DIAGNOSIS — H35033 Hypertensive retinopathy, bilateral: Secondary | ICD-10-CM

## 2020-10-15 DIAGNOSIS — I10 Essential (primary) hypertension: Secondary | ICD-10-CM

## 2020-10-28 ENCOUNTER — Telehealth: Payer: Self-pay | Admitting: Interventional Cardiology

## 2020-10-28 DIAGNOSIS — I10 Essential (primary) hypertension: Secondary | ICD-10-CM

## 2020-10-28 DIAGNOSIS — I6523 Occlusion and stenosis of bilateral carotid arteries: Secondary | ICD-10-CM

## 2020-10-28 DIAGNOSIS — I2510 Atherosclerotic heart disease of native coronary artery without angina pectoris: Secondary | ICD-10-CM

## 2020-10-28 NOTE — Telephone Encounter (Signed)
Patient notified.  I verbally went over ETT instructions with patient.

## 2020-10-28 NOTE — Telephone Encounter (Signed)
Ok to order ETT.  JV

## 2020-10-28 NOTE — Telephone Encounter (Signed)
I spoke with patient.  He is feeling fine and not having any issues.  He reports he used to have nuclear stress tests done after he had CABG and at last visit Dr Irish Lack mentioned he could probably have ETT done in the future instead of nuclear.  He is calling to see if ETT should be done prior to December appointment

## 2020-10-28 NOTE — Telephone Encounter (Signed)
Patient is requesting an order to have an ETT prior to appointment on 03/17/21 with Dr. Irish Lack.

## 2020-10-28 NOTE — Telephone Encounter (Signed)
Patient is requesting a new order for a carotid. He is hoping to schedule his carotid for 12/02/20, but the current order expires on 10/30/20.

## 2020-10-28 NOTE — Telephone Encounter (Signed)
New order placed

## 2020-10-29 ENCOUNTER — Encounter (HOSPITAL_COMMUNITY): Payer: Medicare Other

## 2020-11-19 ENCOUNTER — Encounter (INDEPENDENT_AMBULATORY_CARE_PROVIDER_SITE_OTHER): Payer: Medicare Other | Admitting: Ophthalmology

## 2020-11-19 IMAGING — DX DG ELBOW COMPLETE 3+V*R*
4 series · 4 of 4 positions shown · non-contrast
Comparison: None.

CLINICAL DATA: Suspect right distal biceps tear

EXAM:
RIGHT ELBOW - COMPLETE 3+ VIEW

[elbow ap]
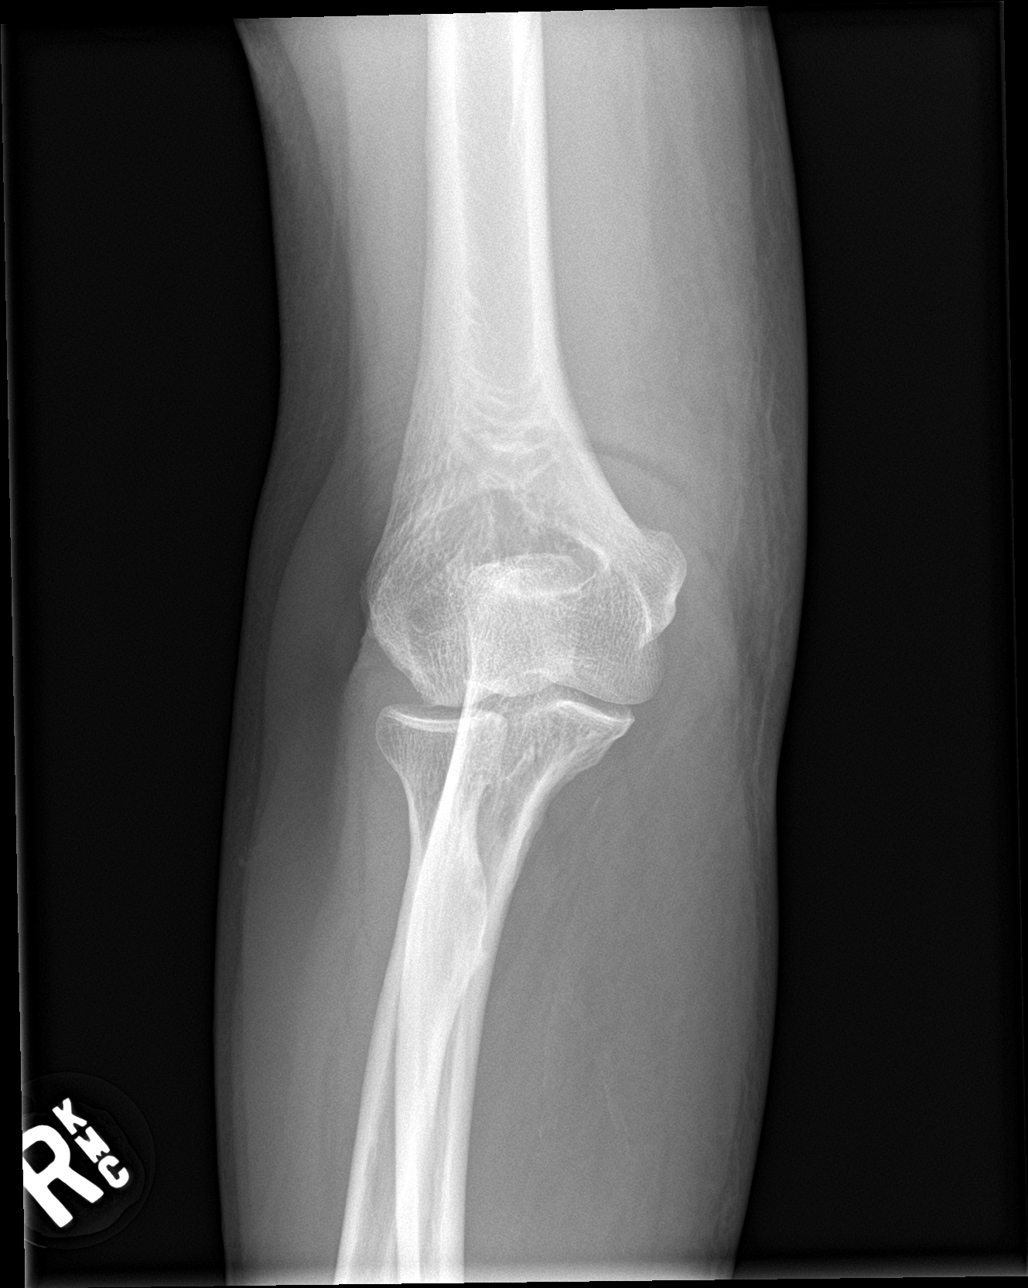

[elbow obl (1 of 2)]
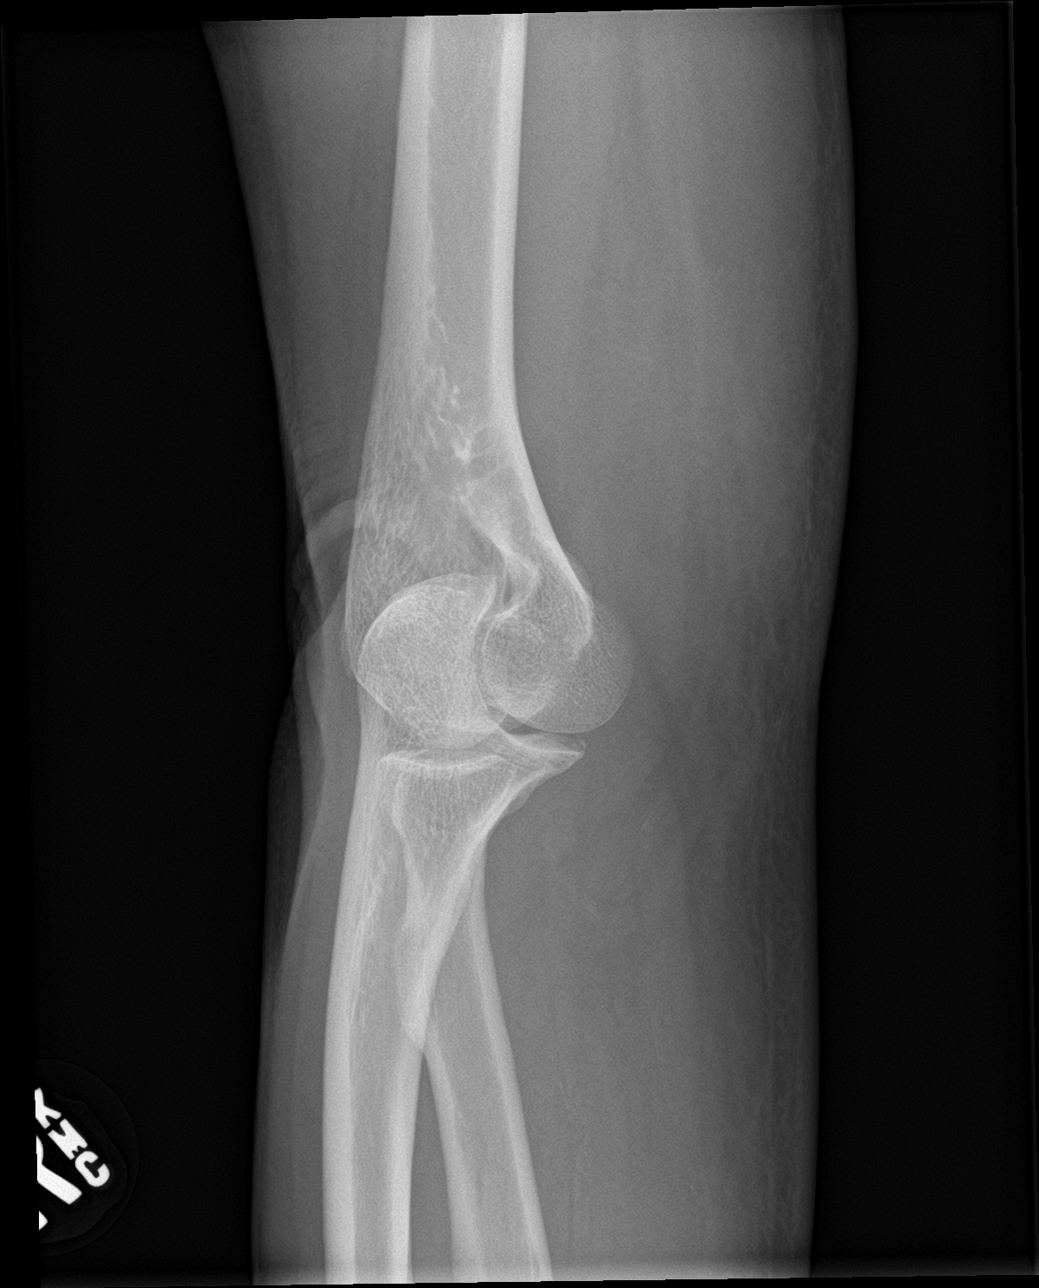

[elbow obl (2 of 2)]
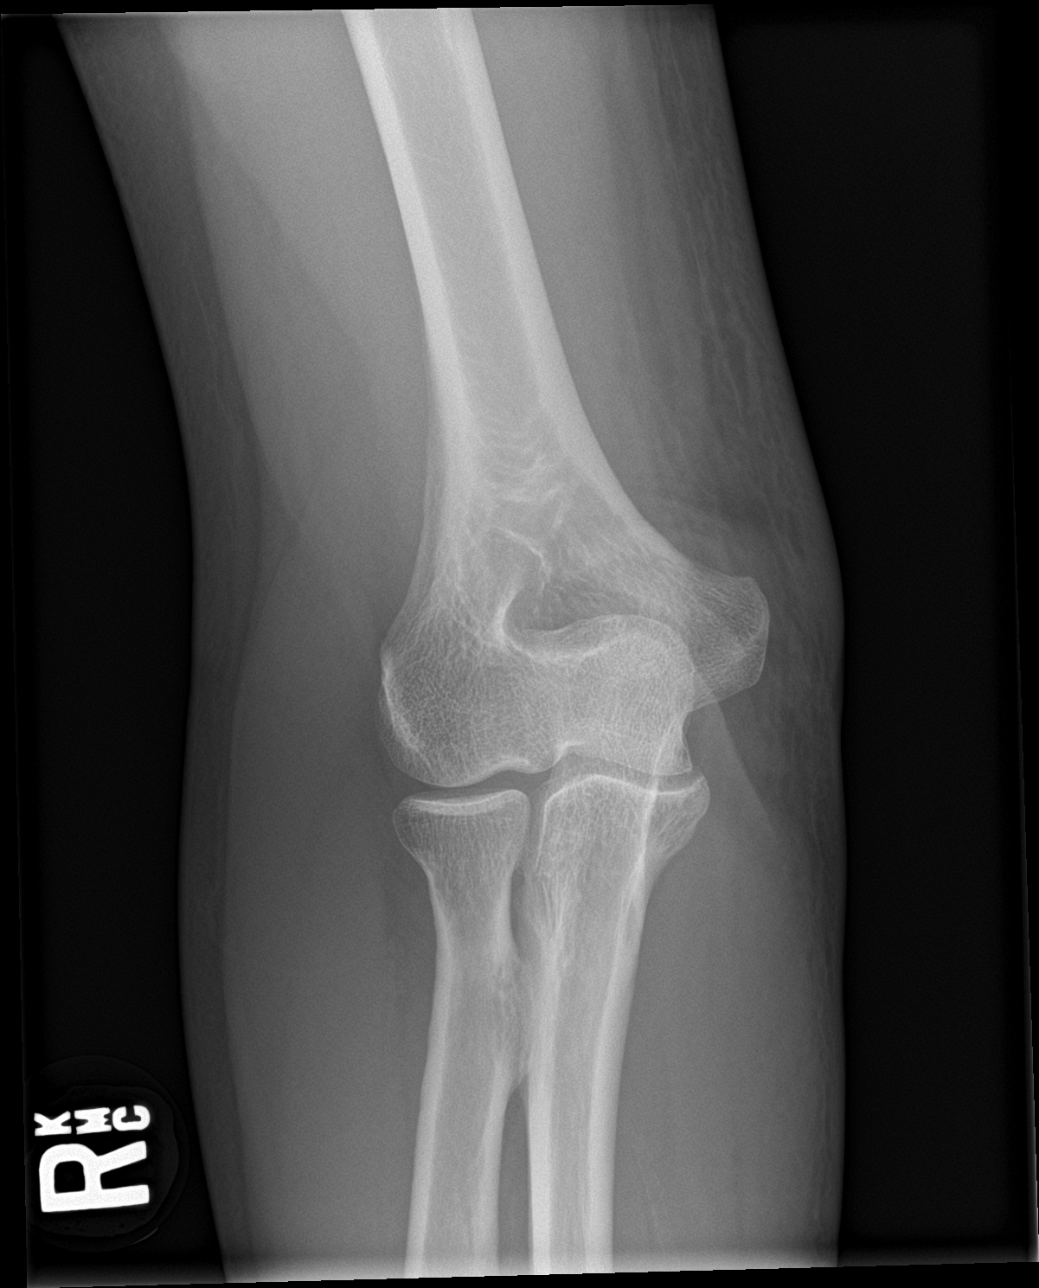

[elbow lat]
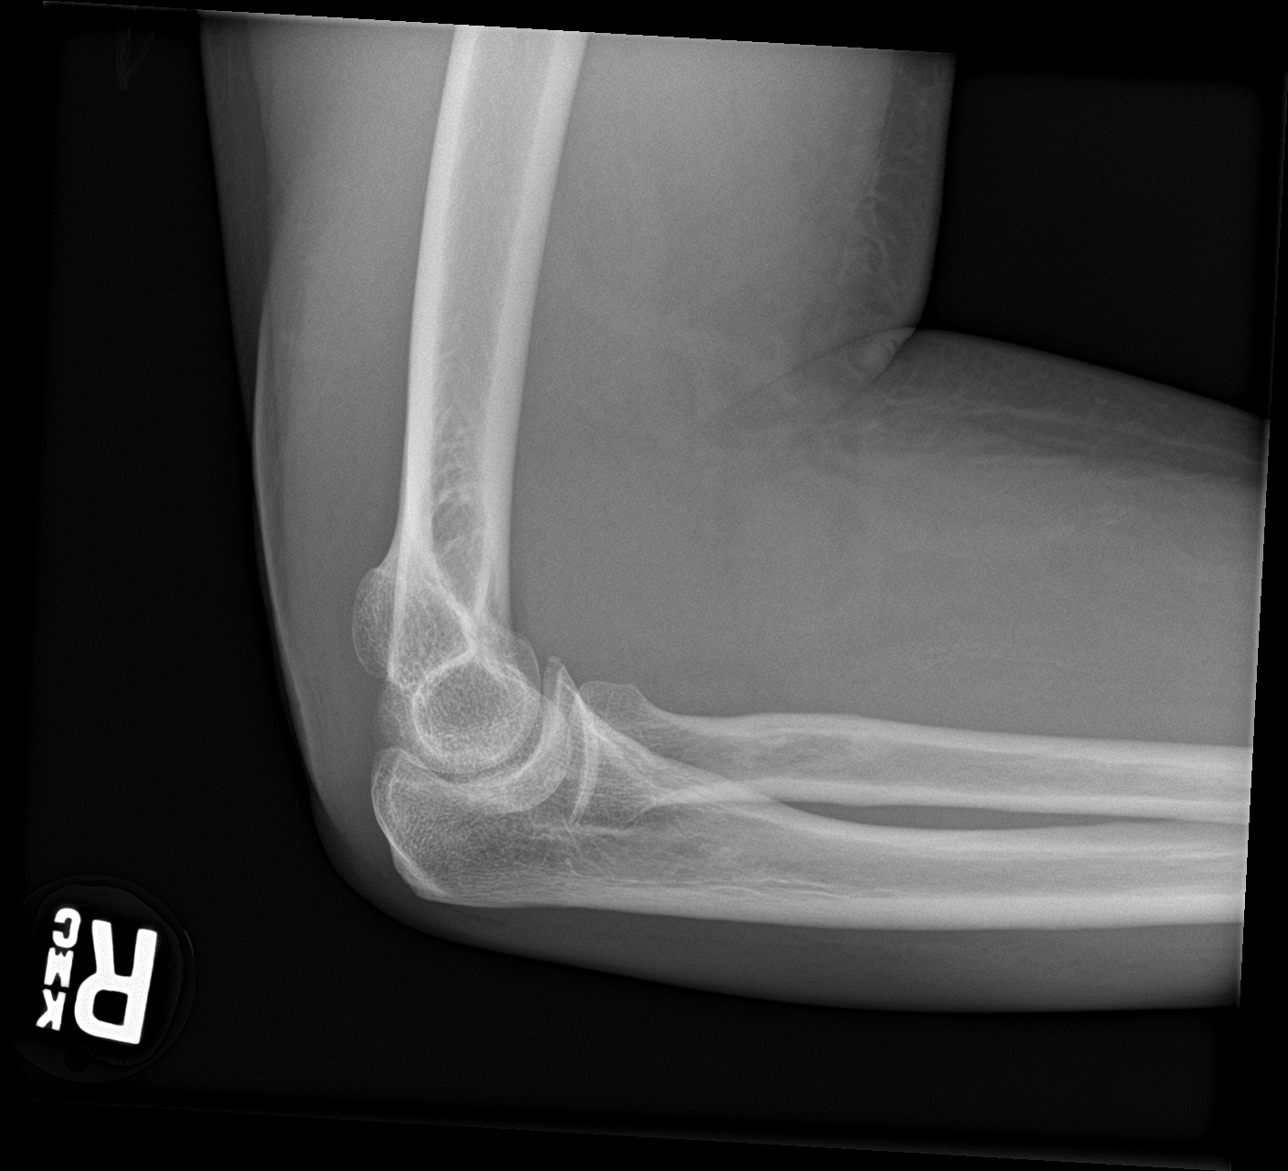

[4 of 4 positions shown; findings below may reference images not displayed]

FINDINGS: No fracture or dislocation of the right elbow. Joint spaces are
preserved. No elbow joint effusion. Soft tissues are unremarkable.
IMPRESSION: No fracture or dislocation of the right elbow. Joint spaces are
preserved. No elbow joint effusion.

## 2020-11-19 IMAGING — MR MR ELBOW*R* W/O CM
6 of 7 series · 30 of 40 positions shown · non-contrast
Comparison: None.

CLINICAL DATA: Suspect right distal biceps tear Injury x 2 weeks,
bowling and bowl got stuck on thumb and pulled arm

EXAM:
MRI OF THE RIGHT ELBOW WITHOUT CONTRAST
TECHNIQUE: Multiplanar, multisequence MR imaging of the elbow was performed. No
intravenous contrast was administered.

[Series 4: T1 · axial · 4.0mm · 0.44mm/px · z∈[-76,+74]mm · 6 of 35 slices shown (1 of 2)]
[im 1/35]
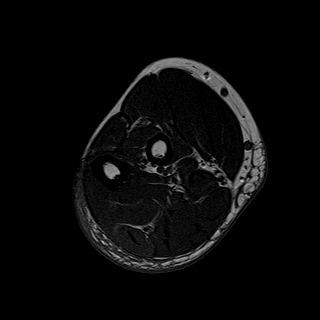
[im 7/35]
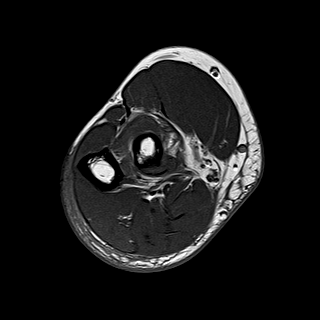
[im 14/35]
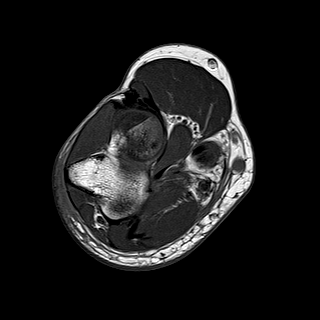
[im 21/35]
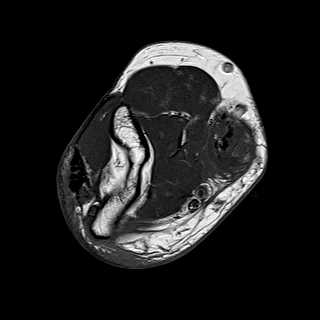
[im 28/35]
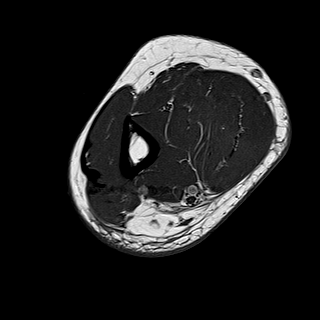
[im 35/35]
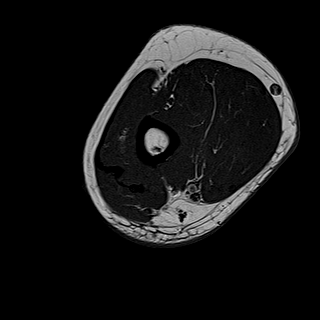

[Series 5: T2 fat-sat · axial · 4.0mm · 0.55mm/px · z∈[-76,+74]mm · 6 of 35 slices shown (1 of 2)]
[im 1/35]
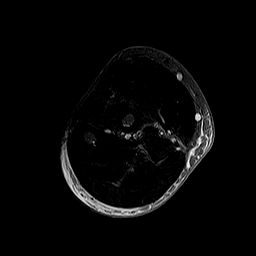
[im 7/35]
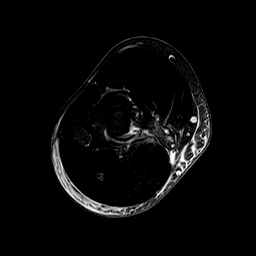
[im 14/35]
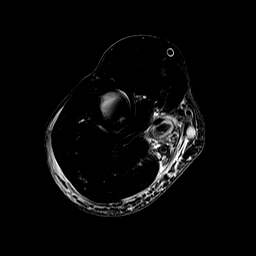
[im 21/35]
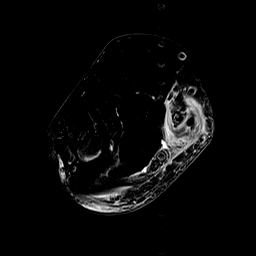
[im 28/35]
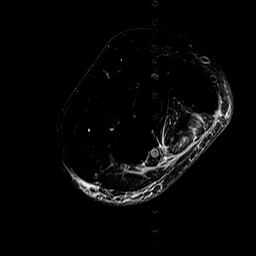
[im 35/35]
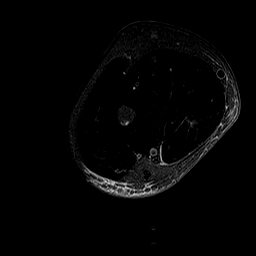

[Series 7: T1 · sagittal · 4.0mm · 0.50mm/px · 1 of 25 slices shown (2 of 2)]
[im 1/25]
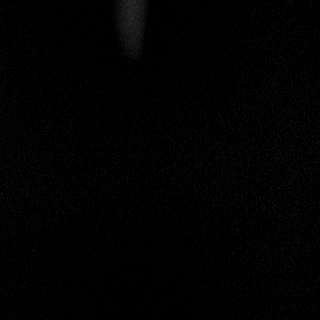

[Series 9: PD fat-sat · coronal · 3.0mm · 0.31mm/px · 6 of 33 slices shown (1 of 2)]
[im 1/33]
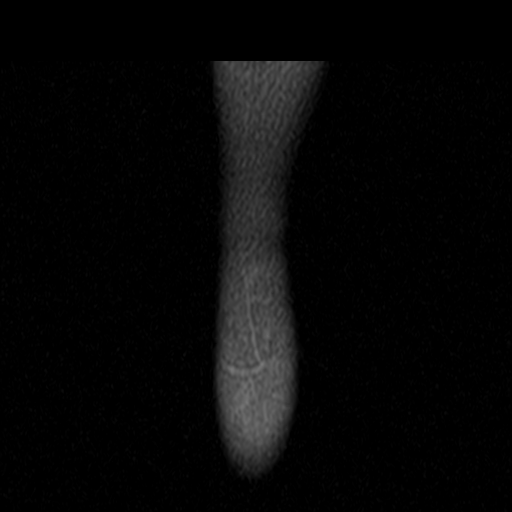
[im 7/33]
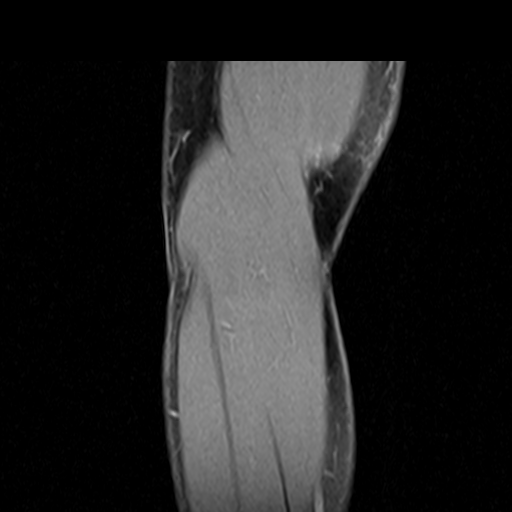
[im 13/33]
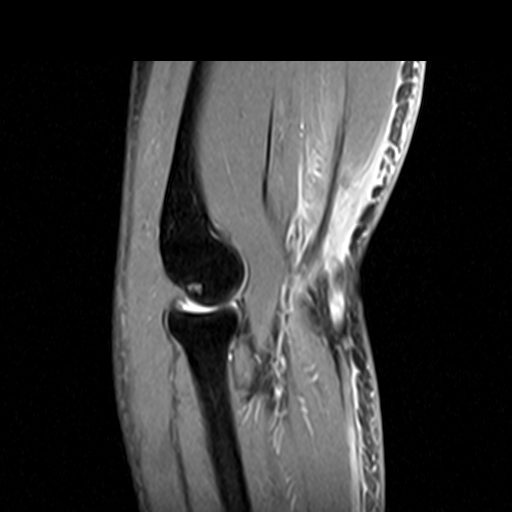
[im 20/33]
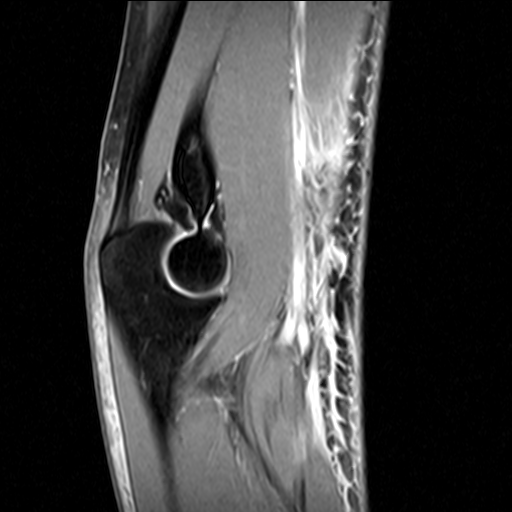
[im 26/33]
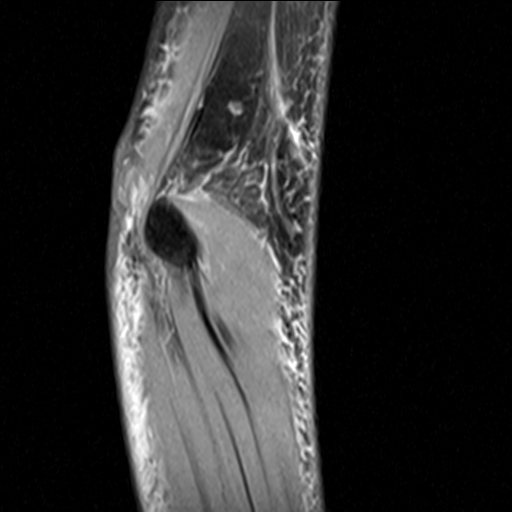
[im 33/33]
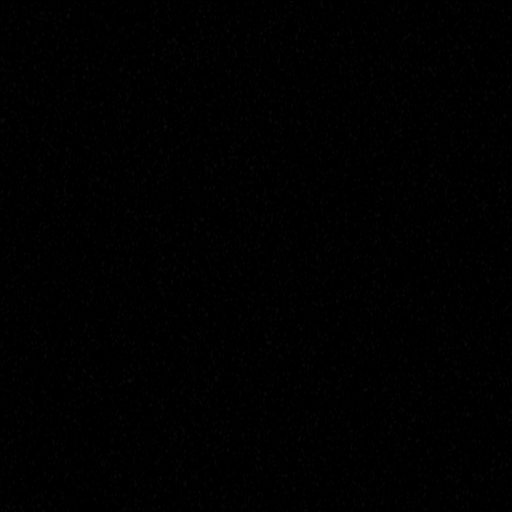

[Series 10: T2 fat-sat · sagittal · 4.0mm · 0.62mm/px · 4 of 25 slices shown (2 of 2)]
[im 1/25]
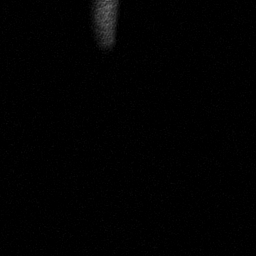
[im 9/25]
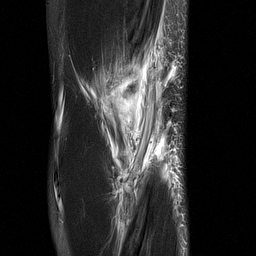
[im 17/25]
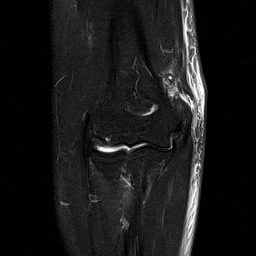
[im 25/25]
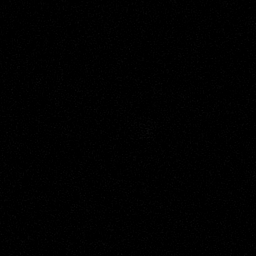

[Series 23: PD fat-sat · sagittal · 3.0mm · 0.27mm/px · 7 of 41 slices shown (2 of 2)]
[im 1/41]
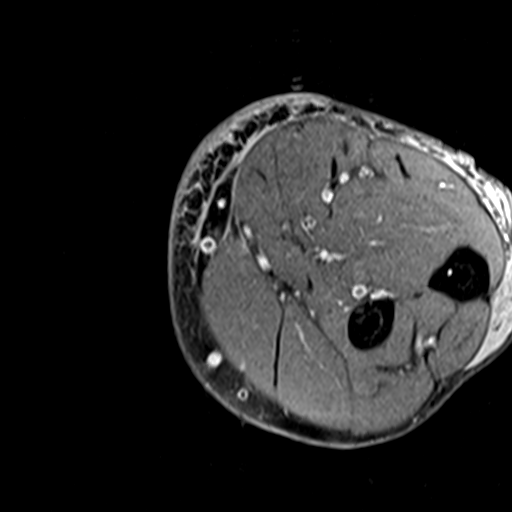
[im 7/41]
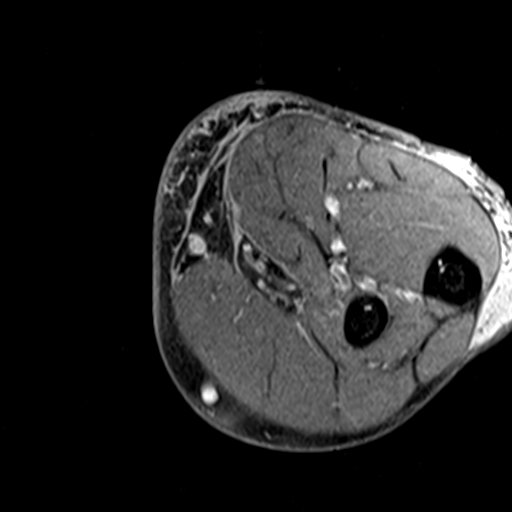
[im 14/41]
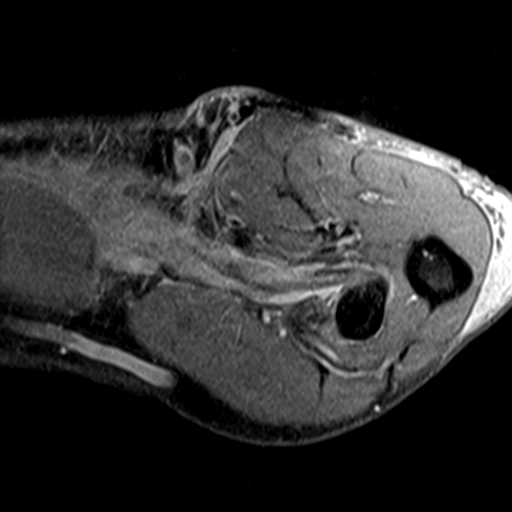
[im 21/41]
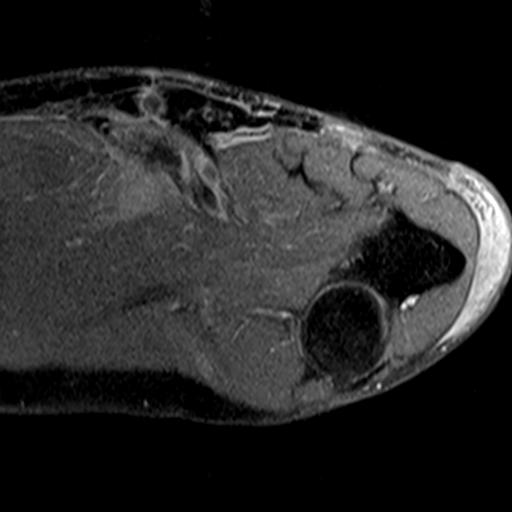
[im 27/41]
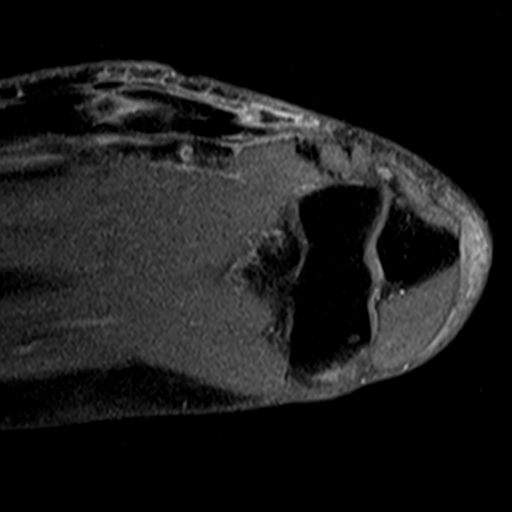
[im 34/41]
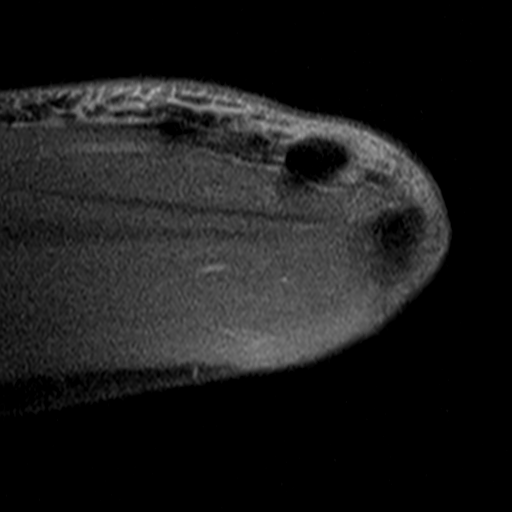
[im 41/41]
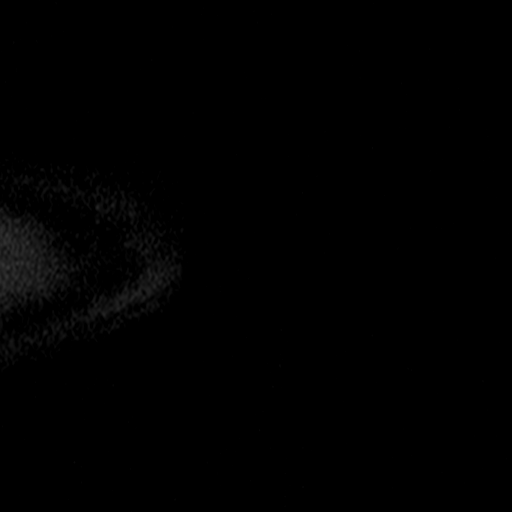

[30 of 40 positions shown; findings below may reference images not displayed]

FINDINGS: TENDONS

Common forearm flexor origin: Intact.

Common forearm extensor origin: Intact.

Biceps: Complete tear of the distal short head of the biceps tendon
with 2 cm of retraction and severe surrounding soft tissue edema and
fluid. Soft tissue edema in the distal short head of the biceps
muscle at the musculotendinous junction consistent with muscle
strain and partial tear with a 18 x 10 mm hematoma along the distal
margin of the musculotendinous junction. Long head of the distal
biceps tendon is intact.

Triceps: Intact.

LIGAMENTS

Medial stabilizers: Intact.

Lateral stabilizers:  Intact.

Cartilage: No chondral defect.

Joint: No joint effusion

Cubital tunnel: . normal cubital tunnel.  Normal ulnar nerve.

Bones: No acute osseous abnormality.  No aggressive osseous lesion.
IMPRESSION: 1. Complete tear of the distal short head of the biceps tendon with
2 cm of retraction and severe surrounding soft tissue edema and
fluid. Soft tissue edema in the distal short head of the biceps
muscle at the musculotendinous junction consistent with muscle
strain and partial tear with a 18 x 10 mm hematoma along the distal
margin of the musculotendinous junction. Long head of the distal
biceps tendon is intact.

## 2020-11-24 ENCOUNTER — Encounter (INDEPENDENT_AMBULATORY_CARE_PROVIDER_SITE_OTHER): Payer: Medicare Other | Admitting: Ophthalmology

## 2020-11-24 ENCOUNTER — Other Ambulatory Visit: Payer: Self-pay

## 2020-11-24 DIAGNOSIS — H35033 Hypertensive retinopathy, bilateral: Secondary | ICD-10-CM

## 2020-11-24 DIAGNOSIS — H43811 Vitreous degeneration, right eye: Secondary | ICD-10-CM | POA: Diagnosis not present

## 2020-11-24 DIAGNOSIS — I10 Essential (primary) hypertension: Secondary | ICD-10-CM

## 2020-11-24 DIAGNOSIS — E113513 Type 2 diabetes mellitus with proliferative diabetic retinopathy with macular edema, bilateral: Secondary | ICD-10-CM

## 2020-11-25 ENCOUNTER — Encounter (INDEPENDENT_AMBULATORY_CARE_PROVIDER_SITE_OTHER): Payer: Medicare Other | Admitting: Ophthalmology

## 2020-11-30 ENCOUNTER — Telehealth: Payer: Self-pay | Admitting: Lab

## 2020-11-30 NOTE — Progress Notes (Signed)
  Chronic Care Management   Outreach Note  11/30/2020 Name: Jeremy Johnston MRN: [id number] DOB: 11/17/1950  Referred by: Ivan Anchors, MD Reason for referral : Medication Management   An unsuccessful telephone outreach was attempted today. The patient was referred to the pharmacist for assistance with care management and care coordination.   Follow Up Plan:  [place]

## 2020-12-07 ENCOUNTER — Telehealth: Payer: Self-pay | Admitting: Lab

## 2020-12-07 NOTE — Progress Notes (Signed)
  Chronic Care Management   Outreach Note  12/07/2020 Name: Jeremy Johnston MRN: [id number] DOB: [birth year]  Referred by: Ivan Anchors, MD Reason for referral : Medication Management   A second unsuccessful telephone outreach was attempted today. The patient was referred to pharmacist for assistance with care management and care coordination.  Follow Up Plan:   [place]

## 2020-12-16 ENCOUNTER — Ambulatory Visit (HOSPITAL_COMMUNITY)
Admission: RE | Admit: 2020-12-16 | Discharge: 2020-12-16 | Disposition: A | Discharge status: Home / Self Care | Payer: Medicare Other | Source: Ambulatory Visit | Attending: Cardiology | Admitting: Cardiology

## 2020-12-16 ENCOUNTER — Other Ambulatory Visit (HOSPITAL_COMMUNITY): Payer: Self-pay | Admitting: Interventional Cardiology

## 2020-12-16 ENCOUNTER — Other Ambulatory Visit: Payer: Self-pay

## 2020-12-16 DIAGNOSIS — I6523 Occlusion and stenosis of bilateral carotid arteries: Secondary | ICD-10-CM

## 2020-12-20 ENCOUNTER — Telehealth: Payer: Self-pay | Admitting: Lab

## 2020-12-20 NOTE — Progress Notes (Signed)
  Chronic Care Management   Outreach Note  12/20/2020 Name: [place] MRN: [id number] DOB: 1950/04/26  Referred by: Ivan Anchors, MD Reason for referral : Medication Management   Third unsuccessful telephone outreach was attempted today. The patient was referred to the pharmacist for assistance with care management and care coordination.   Follow Up Plan:  [place]

## 2020-12-21 ENCOUNTER — Other Ambulatory Visit: Payer: Self-pay | Admitting: *Deleted

## 2020-12-21 NOTE — Progress Notes (Signed)
Opened in error

## 2021-01-05 ENCOUNTER — Other Ambulatory Visit: Payer: Self-pay

## 2021-01-05 ENCOUNTER — Encounter (INDEPENDENT_AMBULATORY_CARE_PROVIDER_SITE_OTHER): Payer: Medicare Other | Admitting: Ophthalmology

## 2021-01-05 DIAGNOSIS — H43811 Vitreous degeneration, right eye: Secondary | ICD-10-CM

## 2021-01-05 DIAGNOSIS — E113513 Type 2 diabetes mellitus with proliferative diabetic retinopathy with macular edema, bilateral: Secondary | ICD-10-CM

## 2021-01-05 DIAGNOSIS — H35033 Hypertensive retinopathy, bilateral: Secondary | ICD-10-CM | POA: Diagnosis not present

## 2021-01-05 DIAGNOSIS — I10 Essential (primary) hypertension: Secondary | ICD-10-CM

## 2021-01-07 ENCOUNTER — Other Ambulatory Visit: Payer: Self-pay | Admitting: *Deleted

## 2021-01-07 DIAGNOSIS — I25118 Atherosclerotic heart disease of native coronary artery with other forms of angina pectoris: Secondary | ICD-10-CM

## 2021-01-07 NOTE — Progress Notes (Signed)
Attestation order for ETT

## 2021-01-25 ENCOUNTER — Other Ambulatory Visit: Payer: Self-pay

## 2021-01-25 ENCOUNTER — Ambulatory Visit (INDEPENDENT_AMBULATORY_CARE_PROVIDER_SITE_OTHER): Payer: Medicare Other

## 2021-01-25 DIAGNOSIS — I2510 Atherosclerotic heart disease of native coronary artery without angina pectoris: Secondary | ICD-10-CM | POA: Diagnosis not present

## 2021-01-25 DIAGNOSIS — I10 Essential (primary) hypertension: Secondary | ICD-10-CM

## 2021-01-25 LAB — EXERCISE TOLERANCE TEST
Angina Index: 0
Duke Treadmill Score: 9
Estimated workload: 10.4
Exercise duration (min): 9 min
Exercise duration (sec): 23 s
MPHR: 150 {beats}/min
Peak HR: 131 {beats}/min
Percent HR: 87 %
RPE: 17
Rest HR: 60 {beats}/min
ST Depression (mm): 0 mm

## 2021-01-26 ENCOUNTER — Other Ambulatory Visit: Payer: Self-pay | Admitting: Interventional Cardiology

## 2021-02-07 ENCOUNTER — Other Ambulatory Visit: Payer: Self-pay | Admitting: Interventional Cardiology

## 2021-02-16 ENCOUNTER — Encounter (INDEPENDENT_AMBULATORY_CARE_PROVIDER_SITE_OTHER): Payer: Medicare Other | Admitting: Ophthalmology

## 2021-02-16 ENCOUNTER — Other Ambulatory Visit: Payer: Self-pay

## 2021-02-16 DIAGNOSIS — H35033 Hypertensive retinopathy, bilateral: Secondary | ICD-10-CM | POA: Diagnosis not present

## 2021-02-16 DIAGNOSIS — I10 Essential (primary) hypertension: Secondary | ICD-10-CM | POA: Diagnosis not present

## 2021-02-16 DIAGNOSIS — H43811 Vitreous degeneration, right eye: Secondary | ICD-10-CM

## 2021-02-16 DIAGNOSIS — E113513 Type 2 diabetes mellitus with proliferative diabetic retinopathy with macular edema, bilateral: Secondary | ICD-10-CM | POA: Diagnosis not present

## 2021-03-15 NOTE — Progress Notes (Signed)
Cardiology Office Note   Date:  03/17/2021   ID:  Jeremy Johnston, DOB November 17, 1950, MRN [id number]  PCP:  Ivan Anchors, MD    No chief complaint on file.  CAD  Wt Readings from Last 3 Encounters:  03/17/21 170 lb 6.4 oz (77.3 kg)  02/04/20 168 lb 9.6 oz (76.5 kg)  03/13/19 171 lb (77.6 kg)       History of Present Illness: Jeremy Johnston is a 70 y.o. male  who has had DM and CAD.  DM diagnosed in 1986. He had CABG in 2008.  He was essentially asypmtomatic at the time of his CABG. Change on routine ECG in 2008 prompted stress testing and w/u that led to CABG.     He had a stress test in 2011 with a small inferior defect.  He had a ETT in 2014 which was unremarkable per his report.  He is alternating nuclear tests and ETT on an annual basis.      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  He works for [employer]. He had an ETT (9 min on treadmill) in 6/16 which was low risk.  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   Normal ETT in 8/17.    2020 stress test showed: "Nuclear stress EF: 60%. Blood pressure demonstrated a normal response to exercise. There was no ST segment deviation noted during stress. Defect 1: There is a large defect of moderate severity present in the basal inferior, basal inferolateral, mid inferior, mid inferolateral, apical inferior and apical lateral location. Findings consistent with prior myocardial infarction inferiorly with peri-infarct ischemia in the mid to apical inferolateral region. This is a low risk study. The left ventricular ejection fraction is normal (55-65%)."   Tore biceps tendon in 2020 while bowling.  He still does these exercises.    In 2021: "He had some trouble swallowing.  GERD sx as well, which improve with standing or shifting to a vertical position.  Bowls 4 days/week. He walks on the treadmill other days.  He will go at 2.5-3 mph for 40 minutes.  BP at home tends to be lower.   Better with lower dose quinapril."  Normal ETT in 01/2021.    Denies : Chest pain. Dizziness. Leg edema. Nitroglycerin use. Orthopnea. Palpitations. Paroxysmal nocturnal dyspnea. Shortness of breath. Syncope.    Past Medical History:  Diagnosis Date   Cancer [facility])    kidney cancer-both kidneys remain   Chronic kidney disease    Robotic Kidney surgery for kidney cancer tumor-kidney remains-   Coronary artery disease    Diabetes mellitus ([place])    Insulin pump -[name], and has a glucose transmitter attached.   Diabetic retinopathy ([place])    left eye   Hyperlipidemia    Hypertension    Sciatica    Vitreous hemorrhage ([facility])    left eye    Past Surgical History:  Procedure Laterality Date   CATARACT EXTRACTION, BILATERAL Bilateral    11'15   CORONARY ARTERY BYPASS GRAFT  10/08   x4 vessels([place])   DIAGNOSTIC LAPAROSCOPY     EYE SURGERY     retina peele   HERNIA REPAIR     KIDNEY SURGERY     tumor removed   PARS PLANA VITRECTOMY 27 GAUGE Left 12/12/2016   Procedure: PARS PLANA VITRECTOMY 79 GAUGE , ENDOLASER GAS/ FLUID EXCHAGNE;  Surgeon: Hayden Pedro, MD;  Location: [facility];  Service: Ophthalmology;  Laterality: Left;   VASECTOMY       Current Outpatient Medications  Medication Sig Dispense Refill   Aflibercept (EYLEA IO) Inject into the eye.     aspirin 325 MG tablet Take 325 mg by mouth as needed for mild pain (REPORTS TAKING 2-3 TABLETS FOR SLEEP.).     brimonidine (ALPHAGAN) 0.15 % ophthalmic solution Place 1 drop into the right eye 2 (two) times daily.  11   clopidogrel (PLAVIX) 75 MG tablet TAKE 1 TABLET DAILY WITH   BREAKFAST 90 tablet 3   dorzolamide-timolol (COSOPT) 22.3-6.8 MG/ML ophthalmic solution Place 1 drop into the right eye 2 (two) times daily.     ezetimibe (ZETIA) 10 MG tablet TAKE ONE TABLET BY MOUTH DAILY 90 tablet 3   famotidine (PEPCID) 20 MG tablet Take 20 mg by mouth daily as needed for heartburn or indigestion.     Insulin Human (INSULIN PUMP)  100 unit/ml SOLN Inject into the skin. Use with insulin as directed     loratadine (CLARITIN) 10 MG tablet Take 10 mg by mouth daily as needed for allergies.     Multiple Vitamin (MULTIVITAMIN WITH MINERALS) TABS tablet Take 1 tablet by mouth 2 (two) times a week.      NOVOLOG 100 UNIT/ML injection Inject 1 Units into the skin continuous. Basal rate 1 unit per hour, may vary.  Bolus 1 unit per 10g of carbs     quinapril (ACCUPRIL) 5 MG tablet TAKE 1 TABLET DAILY 90 tablet 3   rosuvastatin (CRESTOR) 40 MG tablet TAKE 1 TABLET AT BEDTIME 90 tablet 3   Besifloxacin HCl (BESIVANCE OP) Place 1 drop into the left eye 4 (four) times daily. The day of and 3-4 days after eye injection every 6 weeks (Patient not taking: Reported on 03/17/2021)     No current facility-administered medications for this visit.    Allergies:   Insulin lispro    Social History:  The patient  reports that he has never smoked. He has never used smokeless tobacco. He reports that he does not drink alcohol and does not use drugs.   Family History:  The patient's family history includes Diabetes in his sister; Heart attack in his father and sister.    ROS:  Please see the history of present illness.   Otherwise, review of systems are positive for sciatic pain.   All other systems are reviewed and negative.    PHYSICAL EXAM: VS:  BP (!) 154/68    Pulse (!) 55    Ht 5\' 7"  (1.702 m)    Wt 170 lb 6.4 oz (77.3 kg)    SpO2 94%    BMI 26.69 kg/m  , BMI Body mass index is 26.69 kg/m. GEN: Well nourished, well developed, in no acute distress HEENT: normal Neck: no JVD, carotid bruits, or masses Cardiac: RRR; no murmurs, rubs, or gallops,no edema  Respiratory:  clear to auscultation bilaterally, normal work of breathing GI: soft, nontender, nondistended, + BS MS: no deformity or atrophy Skin: warm and dry, no rash Neuro:  Strength and sensation are intact Psych: euthymic mood, full affect   EKG:   The ekg ordered today  demonstrates NSR, inferior Q waves.  NSST laterally   Recent Labs: No results found for requested labs within last 8760 hours.   Lipid Panel    Component Value Date/Time   CHOL 127 10/25/2017 0842   TRIG 58 10/25/2017 0842   HDL 40 10/25/2017 0842   CHOLHDL  3.2 10/25/2017 0842   LDLCALC 75 10/25/2017 0842     Other studies Reviewed: Additional studies/ records that were reviewed today with results demonstrating: labs reviewed.   ASSESSMENT AND PLAN:  CAD: No angina. Negative ETT.  Continue aggressive secondary prevention.  DM: Healthy diet.  COntinues to walk regularly.  A1C 6.5.  Whole food, plant-based diet. Hyperlipidemia: LDL 76 in 10/2020.  COntinue high dose statin and Zetia.  He has been eating more protein in the AM.  Hypertensive heart disease: The current medical regimen is effective;  continue present plan and medications.  At home, low in the mornings.  Systolic around 017. He has lightheadedness with that reading.  Move accupril to midday since readings are higher in the afternoon, up to 140s. CArotid artery disease: moderate left sided disease.  Repeat Doppler in 12 months   Current medicines are reviewed at length with the patient today.  The patient concerns regarding his medicines were addressed.  The following changes have been made:  No change  Labs/ tests ordered today include:  No orders of the defined types were placed in this encounter.   Recommend 150 minutes/week of aerobic exercise Low fat, low carb, high fiber diet recommended  Disposition:   FU in 1 year   Signed, Larae Grooms, MD  03/17/2021 2:19 PM    [facility] [address], [place], [place]  [address] Phone: [phone]; Fax: [phone]

## 2021-03-17 ENCOUNTER — Encounter: Payer: Self-pay | Admitting: Interventional Cardiology

## 2021-03-17 ENCOUNTER — Other Ambulatory Visit: Payer: Self-pay

## 2021-03-17 ENCOUNTER — Ambulatory Visit (INDEPENDENT_AMBULATORY_CARE_PROVIDER_SITE_OTHER): Payer: Medicare Other | Admitting: Interventional Cardiology

## 2021-03-17 VITALS — BP 154/68 | HR 55 | Ht 67.0 in | Wt 170.4 lb

## 2021-03-17 DIAGNOSIS — I6523 Occlusion and stenosis of bilateral carotid arteries: Secondary | ICD-10-CM | POA: Diagnosis not present

## 2021-03-17 DIAGNOSIS — E782 Mixed hyperlipidemia: Secondary | ICD-10-CM | POA: Diagnosis not present

## 2021-03-17 DIAGNOSIS — I25118 Atherosclerotic heart disease of native coronary artery with other forms of angina pectoris: Secondary | ICD-10-CM

## 2021-03-17 DIAGNOSIS — I10 Essential (primary) hypertension: Secondary | ICD-10-CM

## 2021-03-17 DIAGNOSIS — E1159 Type 2 diabetes mellitus with other circulatory complications: Secondary | ICD-10-CM

## 2021-03-17 NOTE — Patient Instructions (Signed)
Medication Instructions:  Your physician recommends that you continue on your current medications as directed. Please refer to the Current Medication list given to you today. Take accupril a little later in the day (around noon) *If you need a refill on your cardiac medications before your next appointment, please call your pharmacy*   Lab Work: none If you have labs (blood work) drawn today and your tests are completely normal, you will receive your results only by: [place] (if you have MyChart) OR A paper copy in the mail If you have any lab test that is abnormal or we need to change your treatment, we will call you to review the results.   Testing/Procedures: none   Follow-Up: At [facility], you and your health needs are our priority.  As part of our continuing mission to provide you with exceptional heart care, we have created designated Provider Care Teams.  These Care Teams include your primary Cardiologist (physician) and Advanced Practice Providers (APPs -  Physician Assistants and Nurse Practitioners) who all work together to provide you with the care you need, when you need it.  We recommend signing up for the patient portal called "MyChart".  Sign up information is provided on this After Visit Summary.  MyChart is used to connect with patients for Virtual Visits (Telemedicine).  Patients are able to view lab/test results, encounter notes, upcoming appointments, etc.  Non-urgent messages can be sent to your provider as well.   To learn more about what you can do with MyChart, go to [email or web address].    Your next appointment:   12 month(s)  The format for your next appointment:   In Person  Provider:   Larae Grooms, MD     Other Instructions

## 2021-03-25 ENCOUNTER — Encounter (INDEPENDENT_AMBULATORY_CARE_PROVIDER_SITE_OTHER): Payer: Medicare Other | Admitting: Ophthalmology

## 2021-03-25 ENCOUNTER — Other Ambulatory Visit: Payer: Self-pay

## 2021-03-25 DIAGNOSIS — I10 Essential (primary) hypertension: Secondary | ICD-10-CM | POA: Diagnosis not present

## 2021-03-25 DIAGNOSIS — H43812 Vitreous degeneration, left eye: Secondary | ICD-10-CM | POA: Diagnosis not present

## 2021-03-25 DIAGNOSIS — E113513 Type 2 diabetes mellitus with proliferative diabetic retinopathy with macular edema, bilateral: Secondary | ICD-10-CM | POA: Diagnosis not present

## 2021-03-25 DIAGNOSIS — H35033 Hypertensive retinopathy, bilateral: Secondary | ICD-10-CM | POA: Diagnosis not present

## 2021-04-27 ENCOUNTER — Encounter (INDEPENDENT_AMBULATORY_CARE_PROVIDER_SITE_OTHER): Payer: Medicare Other | Admitting: Ophthalmology

## 2021-05-04 ENCOUNTER — Encounter (INDEPENDENT_AMBULATORY_CARE_PROVIDER_SITE_OTHER): Payer: Medicare Other | Admitting: Ophthalmology

## 2021-05-11 ENCOUNTER — Encounter (INDEPENDENT_AMBULATORY_CARE_PROVIDER_SITE_OTHER): Payer: Medicare Other | Admitting: Ophthalmology

## 2021-05-17 ENCOUNTER — Encounter (INDEPENDENT_AMBULATORY_CARE_PROVIDER_SITE_OTHER): Payer: Medicare Other | Admitting: Ophthalmology

## 2021-05-17 ENCOUNTER — Other Ambulatory Visit: Payer: Self-pay

## 2021-05-17 DIAGNOSIS — I10 Essential (primary) hypertension: Secondary | ICD-10-CM | POA: Diagnosis not present

## 2021-05-17 DIAGNOSIS — H35033 Hypertensive retinopathy, bilateral: Secondary | ICD-10-CM

## 2021-05-17 DIAGNOSIS — E113513 Type 2 diabetes mellitus with proliferative diabetic retinopathy with macular edema, bilateral: Secondary | ICD-10-CM | POA: Diagnosis not present

## 2021-05-17 DIAGNOSIS — H43812 Vitreous degeneration, left eye: Secondary | ICD-10-CM | POA: Diagnosis not present

## 2021-05-23 ENCOUNTER — Other Ambulatory Visit: Payer: Self-pay | Admitting: Interventional Cardiology

## 2021-05-24 NOTE — Telephone Encounter (Signed)
Per pharmacy, All strengths of ACCUPRIL and QUINAPRIL TABLETS are on  back order with no release date.  To prescribe an alternate please respond with appropriate changes or comment to Pharmacy. Please address

## 2021-05-25 LAB — HEMOGLOBIN A1C: Hemoglobin A1C: 6.7

## 2021-05-25 MED ORDER — LISINOPRIL 5 MG PO TABS: 5.0000 mg | ORAL_TABLET | Freq: Every day | ORAL | 3 refills | Status: DC

## 2021-05-25 NOTE — Addendum Note (Signed)
Addended by: Thompson Grayer on: 05/25/2021 09:27 AM   Modules accepted: Orders

## 2021-05-25 NOTE — Telephone Encounter (Signed)
Can change to equivalent dose of lisinopril 5mg  daily.

## 2021-05-25 NOTE — Telephone Encounter (Signed)
Jettie Booze, MD  You 40 minutes ago (8:38 AM)   OK with me to change to lisinopril 5 mg daily

## 2021-05-25 NOTE — Telephone Encounter (Signed)
Patient notified

## 2021-06-24 ENCOUNTER — Other Ambulatory Visit: Payer: Self-pay

## 2021-06-24 ENCOUNTER — Encounter (INDEPENDENT_AMBULATORY_CARE_PROVIDER_SITE_OTHER): Payer: Medicare Other | Admitting: Ophthalmology

## 2021-06-24 DIAGNOSIS — I10 Essential (primary) hypertension: Secondary | ICD-10-CM

## 2021-06-24 DIAGNOSIS — E113513 Type 2 diabetes mellitus with proliferative diabetic retinopathy with macular edema, bilateral: Secondary | ICD-10-CM | POA: Diagnosis not present

## 2021-06-24 DIAGNOSIS — H35033 Hypertensive retinopathy, bilateral: Secondary | ICD-10-CM | POA: Diagnosis not present

## 2021-06-24 DIAGNOSIS — H43811 Vitreous degeneration, right eye: Secondary | ICD-10-CM

## 2021-07-29 ENCOUNTER — Encounter (INDEPENDENT_AMBULATORY_CARE_PROVIDER_SITE_OTHER): Payer: Medicare Other | Admitting: Ophthalmology

## 2021-08-01 ENCOUNTER — Encounter (INDEPENDENT_AMBULATORY_CARE_PROVIDER_SITE_OTHER): Payer: Medicare Other | Admitting: Ophthalmology

## 2021-08-01 DIAGNOSIS — E113513 Type 2 diabetes mellitus with proliferative diabetic retinopathy with macular edema, bilateral: Secondary | ICD-10-CM | POA: Diagnosis not present

## 2021-08-01 DIAGNOSIS — H43811 Vitreous degeneration, right eye: Secondary | ICD-10-CM

## 2021-08-01 DIAGNOSIS — H35033 Hypertensive retinopathy, bilateral: Secondary | ICD-10-CM | POA: Diagnosis not present

## 2021-08-01 DIAGNOSIS — I10 Essential (primary) hypertension: Secondary | ICD-10-CM

## 2021-08-14 DIAGNOSIS — E103599 Type 1 diabetes mellitus with proliferative diabetic retinopathy without macular edema, unspecified eye: Secondary | ICD-10-CM | POA: Insufficient documentation

## 2021-08-14 DIAGNOSIS — E1042 Type 1 diabetes mellitus with diabetic polyneuropathy: Secondary | ICD-10-CM | POA: Insufficient documentation

## 2021-08-14 DIAGNOSIS — Z85528 Personal history of other malignant neoplasm of kidney: Secondary | ICD-10-CM | POA: Insufficient documentation

## 2021-08-14 NOTE — Progress Notes (Signed)
? ?New Patient Office Visit ? ?Subjective:  ?Patient ID: Jeremy Johnston, male    DOB: [birth year]  Age: 71 y.o. MRN: [id number] ? ?CC:  ?Chief Complaint  ?Patient presents with  ? Establish Care  ? Eye Exam  ?  Done with Dr. [facility]   ? ?HPI ?[name] presents to establish care. He is a pleasant 71 year old male who currently sees Cardiology and Endocrinology for management of hypertension, hyperlipidemia, and type 1 diabetes.  ? ?Has trouble with his piriformis muscle after an injury many years ago. Has had multiple workups by several different specialists with no answers. Physical therapy was not helpful. Does home stretches which are helpful. Cortisone injections in the are were not helpful.  ? ?Past Medical History:  ?Diagnosis Date  ? Arthritis   ? Cancer [facility])   ? kidney cancer-both kidneys remain  ? Chronic kidney disease   ? Robotic Kidney surgery for kidney cancer tumor-kidney remains-  ? Coronary artery disease   ? Diabetes mellitus ([place])   ? Insulin pump -[name], and has a glucose transmitter attached.  ? Diabetic retinopathy ([place])   ? left eye  ? Hyperlipidemia   ? Hypertension   ? Sciatica   ? Vitreous hemorrhage ([place])   ? left eye  ? ? ?Past Surgical History:  ?Procedure Laterality Date  ? CATARACT EXTRACTION, BILATERAL Bilateral   ? 11'15  ? CORONARY ARTERY BYPASS GRAFT  01/2007  ? x4 vessels([place])  ? DIAGNOSTIC LAPAROSCOPY    ? EYE SURGERY    ? retina peele  ? HERNIA REPAIR    ? KIDNEY SURGERY    ? tumor removed  ? PARS PLANA VITRECTOMY 27 GAUGE Left 12/12/2016  ? Procedure: PARS PLANA VITRECTOMY 58 GAUGE , ENDOLASER GAS/ FLUID EXCHAGNE;  Surgeon: Hayden Pedro, MD;  Location: [place];  Service: Ophthalmology;  Laterality: Left;  ? VASECTOMY    ? ? ?Family History  ?Problem Relation Age of Onset  ? Heart attack Father   ? Heart attack Sister   ? Diabetes Sister   ? Healthy Brother   ? Healthy Half-Brother   ? Healthy Half-Brother   ? Healthy Half-Brother   ? Healthy Half-Sister   ?  Healthy Half-Sister   ? ? ?Social History  ? ?Socioeconomic History  ? Marital status: Married  ?  Spouse name: Not on file  ? Number of children: 3  ? Years of education: Not on file  ? Highest education level: Not on file  ?Occupational History  ? Occupation: Retired  ?Tobacco Use  ? Smoking status: Never  ? Smokeless tobacco: Never  ?Vaping Use  ? Vaping Use: Never used  ?Substance and Sexual Activity  ? Alcohol use: No  ? Drug use: No  ? Sexual activity: Yes  ?  Birth control/protection: None  ?Other Topics Concern  ? Not on file  ?Social History Narrative  ? Exercise bowl 4-5 times a week. Treadmill once a week.   ? ?Social Determinants of Health  ? ?Financial Resource Strain: Not on file  ?Food Insecurity: Not on file  ?Transportation Needs: Not on file  ?Physical Activity: Not on file  ?Stress: Not on file  ?Social Connections: Not on file  ?Intimate Partner Violence: Not on file  ? ? ?ROS ?Review of Systems  ?Constitutional:  Negative for chills, fatigue, fever and unexpected weight change.  ?HENT:  Negative for congestion, rhinorrhea, sinus pressure and sore throat.   ?Eyes:  Negative for visual disturbance.  ?  Respiratory:  Negative for cough, chest tightness, shortness of breath and wheezing.   ?Cardiovascular:  Negative for chest pain, palpitations and leg swelling.  ?Gastrointestinal:  Negative for abdominal pain, constipation, diarrhea, nausea and vomiting.  ?Genitourinary:  Negative for dysuria, frequency and urgency.  ?Musculoskeletal:  Positive for myalgias (Right piriformis pain).  ?Skin:  Negative for rash.  ?Neurological:  Negative for dizziness, light-headedness and headaches.  ?Psychiatric/Behavioral:  Negative for dysphoric mood, self-injury, sleep disturbance and suicidal ideas. The patient is not nervous/anxious.   ? ?Objective:  ? ?Today's Vitals: BP (!) 142/70   Resp 16   Ht '5\' 7"'$  (1.702 m)   Wt 167 lb (75.8 kg)   SpO2 (!) 56%   BMI 26.16 kg/m?  ? ?Physical Exam ?Vitals and nursing  note reviewed.  ?Constitutional:   ?   General: He is not in acute distress. ?   Appearance: Normal appearance. He is not ill-appearing.  ?HENT:  ?   Head: Normocephalic and atraumatic.  ?Cardiovascular:  ?   Rate and Rhythm: Normal rate and regular rhythm.  ?   Pulses: Normal pulses.  ?   Heart sounds: Normal heart sounds. No murmur heard. ?  No friction rub. No gallop.  ?Pulmonary:  ?   Effort: Pulmonary effort is normal. No respiratory distress.  ?   Breath sounds: Normal breath sounds.  ?Skin: ?   General: Skin is warm and dry.  ?Neurological:  ?   Mental Status: He is alert and oriented to person, place, and time.  ?Psychiatric:     ?   Mood and Affect: Mood normal.     ?   Behavior: Behavior normal.     ?   Thought Content: Thought content normal.     ?   Judgment: Judgment normal.  ? ? ?Assessment & Plan:  ? ?1. Encounter to establish care ?Reviewed available information and discussed care concerns with patient.  ? ?2. Mixed hyperlipidemia ?3. Essential hypertension ?Managed by cardiology. ? ?4.  Piriformis muscle pain  ?Continue conservative measures and home stretching.  Consider further evaluation by Dr. Darene Lamer here in our office.   ? ?5.  Immunization due ?Prevnar 20 given in office today. ?- Pneumococcal conjugate vaccine 20-valent (Prevnar 20) ? ?6. Colon cancer screening ?Cologuard ordered. ?- Cologuard ? ?Outpatient Encounter Medications as of 08/15/2021  ?Medication Sig  ? Aflibercept (EYLEA IO) Inject into the eye.  ? aspirin 325 MG tablet Take 325 mg by mouth as needed for mild pain (REPORTS TAKING 2-3 TABLETS FOR SLEEP.).  ? brimonidine (ALPHAGAN) 0.15 % ophthalmic solution Place 1 drop into the right eye 2 (two) times daily.  ? clopidogrel (PLAVIX) 75 MG tablet TAKE 1 TABLET DAILY WITH   BREAKFAST  ? dorzolamide-timolol (COSOPT) 22.3-6.8 MG/ML ophthalmic solution Place 1 drop into the right eye 2 (two) times daily.  ? ezetimibe (ZETIA) 10 MG tablet TAKE ONE TABLET BY MOUTH DAILY  ? famotidine (PEPCID)  20 MG tablet Take 20 mg by mouth daily as needed for heartburn or indigestion.  ? Insulin Human (INSULIN PUMP) 100 unit/ml SOLN Inject into the skin. Use with insulin as directed  ? lisinopril (ZESTRIL) 5 MG tablet Take 1 tablet (5 mg total) by mouth daily.  ? loratadine (CLARITIN) 10 MG tablet Take 10 mg by mouth daily as needed for allergies.  ? Multiple Vitamin (MULTIVITAMIN WITH MINERALS) TABS tablet Take 1 tablet by mouth 2 (two) times a week.   ? NOVOLOG 100 UNIT/ML injection Inject 1  Units into the skin continuous. Basal rate 1 unit per hour, may vary.  Bolus 1 unit per 10g of carbs  ? rosuvastatin (CRESTOR) 40 MG tablet TAKE 1 TABLET AT BEDTIME  ? trimethoprim-polymyxin b (POLYTRIM) ophthalmic solution   ? [DISCONTINUED] Besifloxacin HCl (BESIVANCE OP) Place 1 drop into the left eye 4 (four) times daily. The day of and 3-4 days after eye injection every 6 weeks (Patient not taking: Reported on 03/17/2021)  ? ?No facility-administered encounter medications on file as of 08/15/2021.  ? ? ?Follow-up: Return for yearly follow up as discussed.  ? ?Clearnce Sorrel, DNP, APRN, FNP-BC ?[place] ?Primary Care and Sports Medicine ? ?

## 2021-08-15 ENCOUNTER — Encounter: Payer: Self-pay | Admitting: Medical-Surgical

## 2021-08-15 ENCOUNTER — Ambulatory Visit (INDEPENDENT_AMBULATORY_CARE_PROVIDER_SITE_OTHER): Payer: Medicare Other | Admitting: Medical-Surgical

## 2021-08-15 VITALS — BP 142/70 | Resp 16 | Ht 67.0 in | Wt 167.0 lb

## 2021-08-15 DIAGNOSIS — M7918 Myalgia, other site: Secondary | ICD-10-CM | POA: Diagnosis not present

## 2021-08-15 DIAGNOSIS — I10 Essential (primary) hypertension: Secondary | ICD-10-CM

## 2021-08-15 DIAGNOSIS — Z23 Encounter for immunization: Secondary | ICD-10-CM | POA: Diagnosis not present

## 2021-08-15 DIAGNOSIS — E782 Mixed hyperlipidemia: Secondary | ICD-10-CM

## 2021-08-15 DIAGNOSIS — Z7689 Persons encountering health services in other specified circumstances: Secondary | ICD-10-CM

## 2021-08-15 DIAGNOSIS — Z1211 Encounter for screening for malignant neoplasm of colon: Secondary | ICD-10-CM

## 2021-08-29 LAB — COLOGUARD: COLOGUARD: NEGATIVE

## 2021-09-05 ENCOUNTER — Encounter (INDEPENDENT_AMBULATORY_CARE_PROVIDER_SITE_OTHER): Payer: Medicare Other | Admitting: Ophthalmology

## 2021-09-05 DIAGNOSIS — H35033 Hypertensive retinopathy, bilateral: Secondary | ICD-10-CM

## 2021-09-05 DIAGNOSIS — I10 Essential (primary) hypertension: Secondary | ICD-10-CM

## 2021-09-05 DIAGNOSIS — E113313 Type 2 diabetes mellitus with moderate nonproliferative diabetic retinopathy with macular edema, bilateral: Secondary | ICD-10-CM

## 2021-09-05 DIAGNOSIS — H43811 Vitreous degeneration, right eye: Secondary | ICD-10-CM | POA: Diagnosis not present

## 2021-10-17 ENCOUNTER — Encounter (INDEPENDENT_AMBULATORY_CARE_PROVIDER_SITE_OTHER): Payer: Medicare Other | Admitting: Ophthalmology

## 2021-10-17 DIAGNOSIS — H43813 Vitreous degeneration, bilateral: Secondary | ICD-10-CM

## 2021-10-17 DIAGNOSIS — E113513 Type 2 diabetes mellitus with proliferative diabetic retinopathy with macular edema, bilateral: Secondary | ICD-10-CM

## 2021-10-17 DIAGNOSIS — H35033 Hypertensive retinopathy, bilateral: Secondary | ICD-10-CM | POA: Diagnosis not present

## 2021-10-17 DIAGNOSIS — I10 Essential (primary) hypertension: Secondary | ICD-10-CM | POA: Diagnosis not present

## 2021-11-07 ENCOUNTER — Ambulatory Visit (INDEPENDENT_AMBULATORY_CARE_PROVIDER_SITE_OTHER): Payer: Medicare Other | Admitting: Medical-Surgical

## 2021-11-07 VITALS — BP 135/68 | HR 64 | Ht 67.0 in | Wt 169.9 lb

## 2021-11-07 DIAGNOSIS — Z0000 Encounter for general adult medical examination without abnormal findings: Secondary | ICD-10-CM | POA: Diagnosis not present

## 2021-11-07 NOTE — Patient Instructions (Addendum)
[place] Maintenance Summary and Written Plan of Care  [name] ,  Thank you for allowing me to perform your Medicare Annual Wellness Visit and for your ongoing commitment to your health.   Health Maintenance & Immunization History Health Maintenance  Topic Date Due   Diabetic kidney evaluation - GFR measurement  11/08/2021 (Originally 10/27/2020)   Diabetic kidney evaluation - Urine ACR  11/08/2021 (Originally 10/26/2021)   Zoster Vaccines- Shingrix (1 of 2) 11/15/2021 (Originally 01/16/2001)   COVID-19 Vaccine (3 - Moderna series) 11/23/2021 (Originally 09/08/2019)   INFLUENZA VACCINE  07/02/2022 (Originally 11/01/2021)   Hepatitis C Screening  11/08/2022 (Originally 01/16/1969)   HEMOGLOBIN A1C  02/25/2022   FOOT EXAM  08/26/2022   OPHTHALMOLOGY EXAM  10/18/2022   Fecal DNA (Cologuard)  08/22/2024   TETANUS/TDAP  10/18/2027   Pneumonia Vaccine 59+ Years old  Completed   HPV VACCINES  Aged Out   Immunization History  Administered Date(s) Administered   Influenza Split 12/25/2012, 02/18/2014   Influenza, High Dose Seasonal PF 01/20/2019   Influenza,inj,Quad PF,6+ Mos 12/13/2015   Influenza,inj,quad, With Preservative 06/03/2015   Moderna Sars-Covid-2 Vaccination 06/16/2019, 07/14/2019   PNEUMOCOCCAL CONJUGATE-20 08/15/2021   Pneumococcal Conjugate-13 11/21/2017   Pneumococcal Polysaccharide-23 08/26/2013   Tdap 10/17/2017    These are the patient goals that we discussed:  Goals Addressed               This Visit's Progress     Patient Stated (pt-stated)        Continue to stay healthy.         This is a list of Health Maintenance Items that are overdue or due now: Shingrix vaccine  Urine ACR GFR Measurement  Patient declined the Shingrix vaccine.   Patient is going to see the endocrinologist on 11/23/21 and he will have GFR and Urine ACR done there.    Orders/Referrals Placed Today: No orders of the defined types were placed  in this encounter.  (Contact our referral department at [phone] if you have not spoken with someone about your referral appointment within the next 5 days)    Follow-up Plan Follow-up with Samuel Bouche, NP as planned Medicare wellness visit in one year.  AVS printed and given to the patient.      Health Maintenance, Male Adopting a healthy lifestyle and getting preventive care are important in promoting health and wellness. Ask your health care provider about: The right schedule for you to have regular tests and exams. Things you can do on your own to prevent diseases and keep yourself healthy. What should I know about diet, weight, and exercise? Eat a healthy diet  Eat a diet that includes plenty of vegetables, fruits, low-fat dairy products, and lean protein. Do not eat a lot of foods that are high in solid fats, added sugars, or sodium. Maintain a healthy weight Body mass index (BMI) is a measurement that can be used to identify possible weight problems. It estimates body fat based on height and weight. Your health care provider can help determine your BMI and help you achieve or maintain a healthy weight. Get regular exercise Get regular exercise. This is one of the most important things you can do for your health. Most adults should: Exercise for at least 150 minutes each week. The exercise should increase your heart rate and make you sweat (moderate-intensity exercise). Do strengthening exercises at least twice a week. This is in addition to the moderate-intensity exercise. Spend less  time sitting. Even light physical activity can be beneficial. Watch cholesterol and blood lipids Have your blood tested for lipids and cholesterol at 71 years of age, then have this test every 5 years. You may need to have your cholesterol levels checked more often if: Your lipid or cholesterol levels are high. You are older than 71 years of age. You are at high risk for heart disease. What  should I know about cancer screening? Many types of cancers can be detected early and may often be prevented. Depending on your health history and family history, you may need to have cancer screening at various ages. This may include screening for: Colorectal cancer. Prostate cancer. Skin cancer. Lung cancer. What should I know about heart disease, diabetes, and high blood pressure? Blood pressure and heart disease High blood pressure causes heart disease and increases the risk of stroke. This is more likely to develop in people who have high blood pressure readings or are overweight. Talk with your health care provider about your target blood pressure readings. Have your blood pressure checked: Every 3-5 years if you are 5-6 years of age. Every year if you are 18 years old or older. If you are between the ages of 47 and 14 and are a current or former smoker, ask your health care provider if you should have a one-time screening for abdominal aortic aneurysm (AAA). Diabetes Have regular diabetes screenings. This checks your fasting blood sugar level. Have the screening done: Once every three years after age 36 if you are at a normal weight and have a low risk for diabetes. More often and at a younger age if you are overweight or have a high risk for diabetes. What should I know about preventing infection? Hepatitis B If you have a higher risk for hepatitis B, you should be screened for this virus. Talk with your health care provider to find out if you are at risk for hepatitis B infection. Hepatitis C Blood testing is recommended for: Everyone born from [birth year] through 1965. Anyone with known risk factors for hepatitis C. Sexually transmitted infections (STIs) You should be screened each year for STIs, including gonorrhea and chlamydia, if: You are sexually active and are younger than 71 years of age. You are older than 71 years of age and your health care provider tells you that you are  at risk for this type of infection. Your sexual activity has changed since you were last screened, and you are at increased risk for chlamydia or gonorrhea. Ask your health care provider if you are at risk. Ask your health care provider about whether you are at high risk for HIV. Your health care provider may recommend a prescription medicine to help prevent HIV infection. If you choose to take medicine to prevent HIV, you should first get tested for HIV. You should then be tested every 3 months for as long as you are taking the medicine. Follow these instructions at home: Alcohol use Do not drink alcohol if your health care provider tells you not to drink. If you drink alcohol: Limit how much you have to 0-2 drinks a day. Know how much alcohol is in your drink. In the [place] one drink equals one 12 oz bottle of beer (355 mL), one 5 oz glass of wine (148 mL), or one 1 oz glass of hard liquor (44 mL). Lifestyle Do not use any products that contain nicotine or tobacco. These products include cigarettes, chewing tobacco, and vaping devices, such as  e-cigarettes. If you need help quitting, ask your health care provider. Do not use street drugs. Do not share needles. Ask your health care provider for help if you need support or information about quitting drugs. General instructions Schedule regular health, dental, and eye exams. Stay current with your vaccines. Tell your health care provider if: You often feel depressed. You have ever been abused or do not feel safe at home. Summary Adopting a healthy lifestyle and getting preventive care are important in promoting health and wellness. Follow your health care provider's instructions about healthy diet, exercising, and getting tested or screened for diseases. Follow your health care provider's instructions on monitoring your cholesterol and blood pressure. This information is not intended to replace advice given to you by your health care provider.  Make sure you discuss any questions you have with your health care provider. Document Revised: 08/09/2020 Document Reviewed: 08/09/2020 Elsevier Patient Education  [place].

## 2021-11-07 NOTE — Progress Notes (Signed)
MEDICARE ANNUAL WELLNESS VISIT  11/07/2021  Subjective:  Jeremy Johnston is a 71 y.o. male patient of Jeremy Bouche, NP who had a Medicare Annual Wellness Visit today. Jeremy Johnston is Retired and lives with their spouse. he has 3 children. he reports that he is socially active and does interact with friends/family regularly. he is moderately physically active and enjoys bowling.  Patient Care Team: Jeremy Bouche, NP as PCP - General (Nurse Practitioner) Jettie Booze, MD as PCP - Cardiology (Cardiology)     11/07/2021    1:25 PM 08/16/2021    9:03 AM 12/12/2016   11:00 PM 07/22/2015    2:11 PM  Advanced Directives  Does Patient Have a Medical Advance Directive? No Yes No No  Type of Corporate treasurer of [place];Out of facility DNR (pink MOST or yellow form)    Does patient want to make changes to medical advance directive?  No - Patient declined    Would patient like information on creating a medical advance directive? No - Patient declined  No - Patient declined No - patient declined information    Hospital Utilization Over the Past 12 Months: # of hospitalizations or ER visits: 1 # of surgeries: 0  Review of Systems    Patient reports that his overall health is unchanged when compared to last year.  Review of Systems: History obtained from chart review and the patient  All other systems negative.  Pain Assessment Pain : 0-10 Pain Score: 2  Pain Type: Chronic pain Pain Location: Hip Pain Orientation: Right Pain Descriptors / Indicators: Burning Pain Onset: More than a month ago Pain Frequency: Intermittent Pain Relieving Factors: being active and moving around helps the pain. Effect of Pain on Daily Activities: Wakes him from sleeping  Pain Relieving Factors: being active and moving around helps the pain.  Current Medications & Allergies (verified) Allergies as of 11/07/2021       Reactions   Insulin Lispro Other (See Comments)        Medication List         Accurate as of November 07, 2021  1:49 PM. If you have any questions, ask your nurse or doctor.          aspirin 325 MG tablet Take 325 mg by mouth as needed for mild pain (REPORTS TAKING 2-3 TABLETS FOR SLEEP.).   brimonidine 0.15 % ophthalmic solution Commonly known as: ALPHAGAN Place 1 drop into both eyes 2 (two) times daily.   clopidogrel 75 MG tablet Commonly known as: PLAVIX TAKE 1 TABLET DAILY WITH   BREAKFAST   dorzolamide-timolol 22.3-6.8 MG/ML ophthalmic solution Commonly known as: COSOPT Place 1 drop into the right eye 2 (two) times daily.   EYLEA IO Inject into the eye.   ezetimibe 10 MG tablet Commonly known as: ZETIA TAKE ONE TABLET BY MOUTH DAILY   famotidine 20 MG tablet Commonly known as: PEPCID Take 20 mg by mouth daily as needed for heartburn or indigestion.   insulin pump Soln Inject into the skin. Use with insulin as directed   lisinopril 5 MG tablet Commonly known as: ZESTRIL Take 1 tablet (5 mg total) by mouth daily.   loratadine 10 MG tablet Commonly known as: CLARITIN Take 10 mg by mouth daily as needed for allergies.   multivitamin with minerals Tabs tablet Take 1 tablet by mouth 2 (two) times a week.   NovoLOG 100 UNIT/ML injection Generic drug: insulin aspart Inject 1 Units into the skin continuous. Basal rate  1 unit per hour, may vary.  Bolus 1 unit per 10g of carbs   rosuvastatin 40 MG tablet Commonly known as: CRESTOR TAKE 1 TABLET AT BEDTIME   trimethoprim-polymyxin b ophthalmic solution Commonly known as: POLYTRIM        History (reviewed): Past Medical History:  Diagnosis Date   Arthritis    Cancer ([place])    kidney cancer-both kidneys remain   Chronic kidney disease    Robotic Kidney surgery for kidney cancer tumor-kidney remains-   Coronary artery disease    Diabetes mellitus ([facility] [place])    Insulin pump -[name], and has a glucose transmitter attached.   Diabetic retinopathy ([place])    left eye   Hyperlipidemia     Hypertension    Sciatica    Vitreous hemorrhage ([facility])    left eye   Past Surgical History:  Procedure Laterality Date   CATARACT EXTRACTION, BILATERAL Bilateral    11'15   CORONARY ARTERY BYPASS GRAFT  01/2007   x4 vessels([place])   DIAGNOSTIC LAPAROSCOPY     EYE SURGERY     retina peele   HERNIA REPAIR     KIDNEY SURGERY     tumor removed   PARS PLANA VITRECTOMY 27 GAUGE Left 12/12/2016   Procedure: PARS PLANA VITRECTOMY 10 GAUGE , ENDOLASER GAS/ FLUID EXCHAGNE;  Surgeon: Hayden Pedro, MD;  Location: [facility];  Service: Ophthalmology;  Laterality: Left;   VASECTOMY     Family History  Problem Relation Age of Onset   Heart attack Father    Heart attack Sister    Diabetes Sister    Healthy Brother    Healthy Half-Brother    Healthy Half-Brother    Healthy Half-Brother    Healthy Half-Sister    Healthy Half-Sister    Social History   Socioeconomic History   Marital status: Married    Spouse name: Jeremy Johnston   Number of children: 3   Years of education: 14   Highest education level: Associate degree: academic program  Occupational History   Occupation: Retired  Tobacco Use   Smoking status: Never   Smokeless tobacco: Never  Vaping Use   Vaping Use: Never used  Substance and Sexual Activity   Alcohol use: No   Drug use: No   Sexual activity: Yes    Birth control/protection: None  Other Topics Concern   Not on file  Social History Narrative   Lives with his wife. Exercise bowl 4-5 times a week. Treadmill once a week.    Social Determinants of Health   Financial Resource Strain: Low Risk  (11/07/2021)   Overall Financial Resource Strain (CARDIA)    Difficulty of Paying Living Expenses: Not hard at all  Food Insecurity: No Food Insecurity (11/07/2021)   Hunger Vital Sign    Worried About Running Out of Food in the Last Year: Never true    Ran Out of Food in the Last Year: Never true  Transportation Needs: No Transportation Needs (11/07/2021)   PRAPARE -  Hydrologist (Medical): No    Lack of Transportation (Non-Medical): No  Physical Activity: Sufficiently Active (11/07/2021)   Exercise Vital Sign    Days of Exercise per Week: 4 days    Minutes of Exercise per Session: 120 min  Stress: No Stress Concern Present (11/07/2021)   [place]    Feeling of Stress : Not at all  Social Connections: [place] (11/07/2021)  Social Licensed conveyancer [NHANES]    Frequency of Communication with Friends and Family: More than three times a week    Frequency of Social Gatherings with Friends and Family: More than three times a week    Attends Religious Services: More than 4 times per year    Active Member of [employer] or Organizations: Yes    Attends Archivist Meetings: More than 4 times per year    Marital Status: Married    Activities of Daily Living    11/07/2021    1:32 PM  In your present state of health, do you have any difficulty performing the following activities:  Hearing? 0  Vision? 0  Difficulty concentrating or making decisions? 0  Walking or climbing stairs? 0  Dressing or bathing? 0  Doing errands, shopping? 0  Preparing Food and eating ? N  Using the Toilet? N  In the past six months, have you accidently leaked urine? N  Do you have problems with loss of bowel control? N  Managing your Medications? N  Managing your Finances? N  Housekeeping or managing your Housekeeping? N    Patient Education/Literacy How often do you need to have someone help you when you read instructions, pamphlets, or other written materials from your doctor or pharmacy?: 1 - Never What is the last grade level you completed in school?: Associates degree  Exercise Current Exercise Habits: Home exercise routine, Type of exercise: treadmill;Other - see comments (bowling), Time (Minutes): > 60, Frequency (Times/Week): 5, Weekly Exercise  (Minutes/Week): 0, Intensity: Moderate, Exercise limited by: None identified  Diet Patient reports consuming 3 meals a day and 1 snack(s) a day Patient reports that his primary diet is: Regular Patient reports that she does have regular access to food.   Depression Screen    11/07/2021    1:28 PM 08/15/2021    2:16 PM 07/22/2015    2:11 PM  PHQ 2/9 Scores  PHQ - 2 Score 0 0 0     Fall Risk    11/07/2021    1:29 PM 08/15/2021    2:16 PM 07/22/2015    2:11 PM 07/03/2013    4:13 PM  [place] in the past year? 0 0 No No  Number falls in past yr: 0 0    Injury with Fall? 0 0    Risk for fall due to : No Fall Risks No Fall Risks    Follow up Falls evaluation completed Falls prevention discussed;Falls evaluation completed       Objective:   BP 135/68 (BP Location: Right Arm, Patient Position: Sitting, Cuff Size: Normal)   Pulse 64   Ht '5\' 7"'$  (1.702 m)   Wt 169 lb 14.4 oz (77.1 kg)   SpO2 99%   BMI 26.61 kg/m   Last Weight  Most recent update: 11/07/2021  1:10 PM    Weight  77.1 kg (169 lb 14.4 oz)             Body mass index is 26.61 kg/m.  Hearing/Vision  Kebin did not have difficulty with hearing/understanding during the face-to-face interview Antwan did not have difficulty with his vision during the face-to-face interview Reports that he has had a formal eye exam by an eye care professional within the past year Reports that he has not had a formal hearing evaluation within the past year  Cognitive Function:    11/07/2021    1:36 PM  6CIT Screen  What Year?  0 points  What month? 0 points  What time? 0 points  Count back from 20 0 points  Months in reverse 0 points  Repeat phrase 0 points  Total Score 0 points    Normal Cognitive Function Screening: Yes (Normal:0-7, Significant for Dysfunction: >8)  Immunization & Health Maintenance Record Immunization History  Administered Date(s) Administered   Influenza Split 12/25/2012, 02/18/2014   Influenza,  High Dose Seasonal PF 01/20/2019   Influenza,inj,Quad PF,6+ Mos 12/13/2015   Influenza,inj,quad, With Preservative 06/03/2015   Moderna Sars-Covid-2 Vaccination 06/16/2019, 07/14/2019   PNEUMOCOCCAL CONJUGATE-20 08/15/2021   Pneumococcal Conjugate-13 11/21/2017   Pneumococcal Polysaccharide-23 08/26/2013   Tdap 10/17/2017    Health Maintenance  Topic Date Due   Diabetic kidney evaluation - GFR measurement  11/08/2021 (Originally 10/27/2020)   Diabetic kidney evaluation - Urine ACR  11/08/2021 (Originally 10/26/2021)   Zoster Vaccines- Shingrix (1 of 2) 11/15/2021 (Originally 01/16/2001)   COVID-19 Vaccine (3 - Moderna series) 11/23/2021 (Originally 09/08/2019)   INFLUENZA VACCINE  07/02/2022 (Originally 11/01/2021)   Hepatitis C Screening  11/08/2022 (Originally 01/16/1969)   HEMOGLOBIN A1C  02/25/2022   FOOT EXAM  08/26/2022   OPHTHALMOLOGY EXAM  10/18/2022   Fecal DNA (Cologuard)  08/22/2024   TETANUS/TDAP  10/18/2027   Pneumonia Vaccine 54+ Years old  Completed   HPV VACCINES  Aged Out       Assessment  This is a routine wellness examination for [employer].  Health Maintenance: Due or Overdue There are no preventive care reminders to display for this patient.   Radene Ou does not need a referral for Community Assistance: Care Management:   no Social Work:    no Prescription Assistance:  no Nutrition/Diabetes Education:  no   Plan:  Personalized Goals  Goals Addressed               This Visit's Progress     Patient Stated (pt-stated)        Continue to stay healthy.       Personalized Health Maintenance & Screening Recommendations  Shingrix vaccine  Urine ACR GFR Measurement  Patient declined the Shingrix vaccine.   Patient is going to see the endocrinologist on 11/23/21 and he will have GFR and Urine ACR done there.  Lung Cancer Screening Recommended: no (Low Dose CT Chest recommended if Age 35-80 years, 30 pack-year currently smoking OR have quit  w/in past 15 years) Hepatitis C Screening recommended: yes HIV Screening recommended: not applicable  Advanced Directives: Written information was not given per the patient's request.  Referrals & Orders No orders of the defined types were placed in this encounter.   Follow-up Plan Follow-up with Jeremy Bouche, NP as planned Medicare wellness visit in one year.  AVS printed and given to the patient.   I have personally reviewed and noted the following in the patient's chart:   Medical and social history Use of alcohol, tobacco or illicit drugs  Current medications and supplements Functional ability and status Nutritional status Physical activity Advanced directives List of other physicians Hospitalizations, surgeries, and ER visits in previous 12 months Vitals Screenings to include cognitive, depression, and falls Referrals and appointments  In addition, I have reviewed and discussed with patient certain preventive protocols, quality metrics, and best practice recommendations. A written personalized care plan for preventive services as well as general preventive health recommendations were provided to patient.     Tinnie Gens, RN BSN  11/07/2021

## 2021-11-10 ENCOUNTER — Encounter: Payer: Self-pay | Admitting: Sports Medicine

## 2021-11-10 ENCOUNTER — Ambulatory Visit (INDEPENDENT_AMBULATORY_CARE_PROVIDER_SITE_OTHER): Payer: Medicare Other | Admitting: Sports Medicine

## 2021-11-10 ENCOUNTER — Ambulatory Visit (INDEPENDENT_AMBULATORY_CARE_PROVIDER_SITE_OTHER): Payer: Medicare Other

## 2021-11-10 DIAGNOSIS — M5441 Lumbago with sciatica, right side: Secondary | ICD-10-CM

## 2021-11-10 DIAGNOSIS — M5416 Radiculopathy, lumbar region: Secondary | ICD-10-CM | POA: Diagnosis not present

## 2021-11-10 MED ORDER — PREDNISONE 50 MG PO TABS: ORAL_TABLET | ORAL | 0 refills | Status: DC

## 2021-11-10 MED ORDER — MELOXICAM 15 MG PO TABS: ORAL_TABLET | ORAL | 3 refills | Status: DC

## 2021-11-10 NOTE — Assessment & Plan Note (Signed)
Very pleasant 71 year old male, long history, several years of axial low back pain with radiation down the right buttock, right lower leg to the inside of the right ankle. Worse with sitting, flexion, Valsalva. Symptoms are worse at night, we will add 5 days of steroids, meloxicam at dinnertime, x-rays, formal physical therapy followed by home rehab. I explained the anatomy, pathophysiology, return to see me in 6 weeks

## 2021-11-10 NOTE — Progress Notes (Signed)
    Procedures performed today:    None.  Independent interpretation of notes and tests performed by another provider:   None.  Brief History, Exam, Impression, and Recommendations:    Right lumbar radiculitis Very pleasant 70 year old male, long history, several years of axial low back pain with radiation down the right buttock, right lower leg to the inside of the right ankle. Worse with sitting, flexion, Valsalva. Symptoms are worse at night, we will add 5 days of steroids, meloxicam at dinnertime, x-rays, formal physical therapy followed by home rehab. I explained the anatomy, pathophysiology, return to see me in 6 weeks    ____________________________________________ Gwen Her. Dianah Field, M.D., ABFM., CAQSM., AME. Primary Care and Sports Medicine [place] [facility]  Adjunct Professor of [employer] [facility] [facility] [employer]  Risk manager

## 2021-11-16 ENCOUNTER — Ambulatory Visit: Payer: Medicare Other | Attending: Sports Medicine | Admitting: Rehabilitative and Restorative Service Providers"

## 2021-11-16 ENCOUNTER — Other Ambulatory Visit: Payer: Self-pay

## 2021-11-16 DIAGNOSIS — R29898 Other symptoms and signs involving the musculoskeletal system: Secondary | ICD-10-CM | POA: Insufficient documentation

## 2021-11-16 DIAGNOSIS — M5416 Radiculopathy, lumbar region: Secondary | ICD-10-CM | POA: Insufficient documentation

## 2021-11-16 DIAGNOSIS — M6281 Muscle weakness (generalized): Secondary | ICD-10-CM | POA: Diagnosis present

## 2021-11-16 NOTE — Therapy (Signed)
OUTPATIENT PHYSICAL THERAPY THORACOLUMBAR EVALUATION   Patient Name: Jeremy Johnston MRN: [id number] DOB:[birth year], 71 y.o., male Today's Date: 11/16/2021   PT End of Session - 11/16/21 1209     Visit Number 1    Number of Visits 12    Date for PT Re-Evaluation 12/28/21    PT Start Time 1018    PT Stop Time 1106    PT Time Calculation (min) 48 min    Activity Tolerance Patient tolerated treatment well             Past Medical History:  Diagnosis Date   Arthritis    Cancer ([place])    kidney cancer-both kidneys remain   Chronic kidney disease    Robotic Kidney surgery for kidney cancer tumor-kidney remains-   Coronary artery disease    Diabetes mellitus ([place])    Insulin pump -[name], and has a glucose transmitter attached.   Diabetic retinopathy ([place])    left eye   Hyperlipidemia    Hypertension    Sciatica    Vitreous hemorrhage ([facility])    left eye   Past Surgical History:  Procedure Laterality Date   CATARACT EXTRACTION, BILATERAL Bilateral    11'15   CORONARY ARTERY BYPASS GRAFT  01/2007   x4 vessels([place])   DIAGNOSTIC LAPAROSCOPY     EYE SURGERY     retina peele   HERNIA REPAIR     KIDNEY SURGERY     tumor removed   PARS PLANA VITRECTOMY 27 GAUGE Left 12/12/2016   Procedure: PARS PLANA VITRECTOMY 56 GAUGE , ENDOLASER GAS/ FLUID EXCHAGNE;  Surgeon: Hayden Pedro, MD;  Location: [place];  Service: Ophthalmology;  Laterality: Left;   VASECTOMY     Patient Active Problem List   Diagnosis Date Noted   Right lumbar radiculitis 11/10/2021   Type 1 diabetes mellitus with diabetic polyneuropathy ([place]) 08/14/2021   Proliferative retinopathy due to type 1 diabetes mellitus ([place]) 08/14/2021   Personal history of other malignant neoplasm of kidney 08/14/2021   Right distal biceps tendon tear 02/07/2019   Vitreous hemorrhage, left eye ([place]) 12/12/2016   Hypertensive heart disease 10/17/2016   Essential hypertension 07/62/2633   Complications affecting other  specified body systems, hypertension 04/17/2013   Mixed hyperlipidemia 04/17/2013   Coronary atherosclerosis of native coronary artery 04/17/2013   Bilateral carotid artery disease ([place]) 04/17/2013   Nonspecific abnormal electrocardiogram (ECG) (EKG) 04/17/2013    PCP: Samuel Bouche, NP  REFERRING PROVIDER: Dr Roma Schanz DIAG: Rt lumbar radiculitis  Rationale for Evaluation and Treatment Rehabilitation  THERAPY DIAG:  Radiculopathy, lumbar region  Other symptoms and signs involving the musculoskeletal system  Muscle weakness (generalized)  ONSET DATE: 10/15/21  SUBJECTIVE:  SUBJECTIVE STATEMENT: Back pain increasing over the past month. He has had intermittent pain in the LB for several years. He is having difficulty sleeping. He has some pain in the Rt buttocks and leg after 2-3 hours of sleep.   PERTINENT HISTORY:  LBP and Rt hip pain since 1991 when he was playing softball. He has had some intermittent back and hip since time of injury. Hx of cardiac bypass; HTN; AODM;   PAIN:  Are you having pain? Yes: NPRS scale: 2/10 Pain location: Rt buttock  Pain description: sharp pain like sitting on a rock; radiating pain through calf to ankle  Aggravating factors: lying down sleeping; sitting > 1 hour Relieving factors: standing; walking; meds    PRECAUTIONS: None  WEIGHT BEARING RESTRICTIONS No  FALLS:  Has patient fallen in last 6 months? No  LIVING ENVIRONMENT: Lives with: lives with their family and lives with their spouse Lives in: House/apartment  OCCUPATION: retired Camera operator Bowls 3-5 times/week   PLOF: Independent  PATIENT GOALS get rid of pain and learn how to do exercises correctly to be able to sleep better    OBJECTIVE:   DIAGNOSTIC FINDINGS:   . X-ray - Mild thoracolumbar dextroscoliosis; Bilateral nephrolithiasis.  PATIENT SURVEYS:  FOTO 50   COGNITION:  Overall cognitive status: Within functional limits for tasks assessed     SENSATION: WFL  MUSCLE LENGTH: Hamstrings: Right 65 deg; Left 70 deg Thomas test: tight Rt > Lt   POSTURE:  head forward; shoulders rounded; slightly flexed forward at hips  PALPATION: Muscular tightness Rt posterior hip lateral sacral border to posterior greater trochanter; piriformis; glut med   LUMBAR ROM:   Active  A/PROM  eval  Flexion 75% pulling Rt HS to posterior knee  Extension 75%  Right lateral flexion 100%  Left lateral flexion 90%  Right rotation 80%  Left rotation 80%   (Blank rows = not tested)  LOWER EXTREMITY ROM:     Tight end ranges Rt hip rotation, flexion; bilat hip ext   LOWER EXTREMITY MMT:    MMT Right eval Left eval  Hip flexion    Hip extension    Hip abduction 4/5 5/5  Hip adduction    Hip internal rotation    Hip external rotation    Knee flexion    Knee extension    Ankle dorsiflexion    Ankle plantarflexion    Ankle inversion    Ankle eversion     (Blank rows = not tested)  LUMBAR SPECIAL TESTS:  Straight leg raise test: Negative and Slump test: Negative  FUNCTIONAL TESTS:  SLS: Rt 2-3 sec; Lt 5-7 sec    GAIT: Distance walked: 40 Assistive device utilized: None Level of assistance: Complete Independence Comments: Gait WNL's     TODAY'S TREATMENT  Instruction in modifications for sitting in recliner Suggestions for modifications for sleeping Lt sidelying   Ther ex: Piriformis stretch travell varying angles 30 sec x 5 reps  Hamstring stretch 30 sec x 2 reps  ITB stretch 30 sec x 3 reps  Prone press up 2 sec x 10  Standing trunk extension 2 sec x 2 reps Hip abduction 2 sec leading with heel x 5 reps      PATIENT EDUCATION:  Education details: POC HEP Person educated: Patient Education method: Consulting civil engineer,  Media planner, Corporate treasurer cues, Verbal cues, and Handouts Education comprehension: verbalized understanding, returned demonstration, verbal cues required, tactile cues required, and needs further education   HOME EXERCISE PROGRAM: Access Code:  [email or web address] URL: [email or web address] Date: 11/16/2021 Prepared by: Gillermo Murdoch  Exercises - Prone Press Up  - 2 x daily - 7 x weekly - 1 sets - 10 reps - 2-3 sec  hold - Supine Piriformis Stretch with Leg Straight  - 2 x daily - 7 x weekly - 1 sets - 3 reps - 30 sec  hold - Hooklying Hamstring Stretch with Strap  - 2 x daily - 7 x weekly - 1 sets - 3 reps - 30 sec  hold - Supine ITB Stretch with Strap  - 2 x daily - 7 x weekly - 1 sets - 3 reps - 30 sec  hold - Standing Lumbar Extension  - 2 x daily - 7 x weekly - 1 sets - 2-3 reps - 2-3 sec  hold - Standing Hip Abduction with Counter Support  - 2 x daily - 7 x weekly - 2-3 sets - 10 reps - 2-3 sec  hold  ASSESSMENT:  CLINICAL IMPRESSION: Patient is a 71 y.o. male who was seen today for physical therapy evaluation and treatment for Rt posterior hip and radicular pain into the Rt posterior thigh to lateral calf. He has limited mobility in trunk and LE; weakness in Rt hip; limited functional activities; pain with functional activities; pain and difficulty with sleeping. He will benefit from PT to address problems.     OBJECTIVE IMPAIRMENTS decreased activity tolerance, decreased mobility, decreased ROM, decreased strength, increased fascial restrictions, impaired flexibility, improper body mechanics, postural dysfunction, and pain.   ACTIVITY LIMITATIONS standing and sleeping  PARTICIPATION LIMITATIONS:  prolonged sitting   PERSONAL FACTORS Time since onset of injury/illness/exacerbation and 1-2 comorbidities: HTN; cardiovascular surgery  are also affecting patient's functional outcome.   REHAB POTENTIAL: Good  CLINICAL DECISION MAKING: Stable/uncomplicated  EVALUATION COMPLEXITY:  Low   GOALS: Goals reviewed with patient? Yes   LONG TERM GOALS: Target date: 12/28/2021  Decrease radicular Rt LE pain by 75-100%  Baseline:  Goal status: INITIAL  2.  Patient to reports sleeping for 6-8 hours without awakening due to pain  Baseline:  Goal status: INITIAL  3.  5/5 strength Rt hip abductors  Baseline:  Goal status: INITIAL  4.  Independent in HEP (including aquatic program as indicated) Baseline:  Goal status: INITIAL  5.  Improve functional limitation score to 62 Baseline:  Goal status: INITIAL    PLAN: PT FREQUENCY: 2x/week  PT DURATION: 6 weeks  PLANNED INTERVENTIONS: Therapeutic exercises, Therapeutic activity, Neuromuscular re-education, Balance training, Gait training, Patient/Family education, Self Care, Joint mobilization, Aquatic Therapy, Dry Needling, Electrical stimulation, Cryotherapy, Moist heat, Taping, Manual therapy, and Re-evaluation.  PLAN FOR NEXT SESSION: Assess response to HEP; progress with stretching and strengthening including core stabilization; manual work and modalities as indicated   Darryll Raju Nilda Simmer, PT, MPH 11/16/2021, 12:59 PM

## 2021-11-21 ENCOUNTER — Telehealth: Payer: Self-pay | Admitting: Rehabilitative and Restorative Service Providers"

## 2021-11-21 NOTE — Telephone Encounter (Signed)
Returned call to address patient's questions re- exercise program. He is progressing well with exercises at home. He wants to add balance activities to program. Also discussed DOMS which he should expect when beginning any new exercise program. Will discuss any additional questions he still has tomorrow. Mikaelyn Arthurs P. Helene Kelp PT, MPH 11/21/21 7:49 AM

## 2021-11-22 ENCOUNTER — Ambulatory Visit: Payer: Medicare Other | Admitting: Rehabilitative and Restorative Service Providers"

## 2021-11-22 ENCOUNTER — Encounter: Payer: Self-pay | Admitting: Rehabilitative and Restorative Service Providers"

## 2021-11-22 DIAGNOSIS — M6281 Muscle weakness (generalized): Secondary | ICD-10-CM

## 2021-11-22 DIAGNOSIS — M5416 Radiculopathy, lumbar region: Secondary | ICD-10-CM

## 2021-11-22 DIAGNOSIS — R29898 Other symptoms and signs involving the musculoskeletal system: Secondary | ICD-10-CM

## 2021-11-22 NOTE — Therapy (Signed)
OUTPATIENT PHYSICAL THERAPY THORACOLUMBAR EVALUATION   Patient Name: Jeremy Johnston MRN: [id number] DOB:10/22/1950, 71 y.o., male Today's Date: 11/22/2021   PT End of Session - 11/22/21 1019     Visit Number 2    Number of Visits 12    Date for PT Re-Evaluation 12/28/21    PT Start Time 1018    PT Stop Time 1100    PT Time Calculation (min) 42 min             Past Medical History:  Diagnosis Date   Arthritis    Cancer ([place])    kidney cancer-both kidneys remain   Chronic kidney disease    Robotic Kidney surgery for kidney cancer tumor-kidney remains-   Coronary artery disease    Diabetes mellitus ([place])    Insulin pump -[name], and has a glucose transmitter attached.   Diabetic retinopathy ([place])    left eye   Hyperlipidemia    Hypertension    Sciatica    Vitreous hemorrhage ([facility])    left eye   Past Surgical History:  Procedure Laterality Date   CATARACT EXTRACTION, BILATERAL Bilateral    11'15   CORONARY ARTERY BYPASS GRAFT  01/2007   x4 vessels([place])   DIAGNOSTIC LAPAROSCOPY     EYE SURGERY     retina peele   HERNIA REPAIR     KIDNEY SURGERY     tumor removed   PARS PLANA VITRECTOMY 27 GAUGE Left 12/12/2016   Procedure: PARS PLANA VITRECTOMY 62 GAUGE , ENDOLASER GAS/ FLUID EXCHAGNE;  Surgeon: Hayden Pedro, MD;  Location: [place];  Service: Ophthalmology;  Laterality: Left;   VASECTOMY     Patient Active Problem List   Diagnosis Date Noted   Right lumbar radiculitis 11/10/2021   Type 1 diabetes mellitus with diabetic polyneuropathy ([place]) 08/14/2021   Proliferative retinopathy due to type 1 diabetes mellitus ([place]) 08/14/2021   Personal history of other malignant neoplasm of kidney 08/14/2021   Right distal biceps tendon tear 02/07/2019   Vitreous hemorrhage, left eye ([place]) 12/12/2016   Hypertensive heart disease 10/17/2016   Essential hypertension 37/16/9678   Complications affecting other specified body systems, hypertension 04/17/2013   Mixed  hyperlipidemia 04/17/2013   Coronary atherosclerosis of native coronary artery 04/17/2013   Bilateral carotid artery disease ([place]) 04/17/2013   Nonspecific abnormal electrocardiogram (ECG) (EKG) 04/17/2013    PCP: Samuel Bouche, NP  REFERRING PROVIDER: Dr Roma Schanz DIAG: Rt lumbar radiculitis  Rationale for Evaluation and Treatment Rehabilitation  THERAPY DIAG:  Radiculopathy, lumbar region  Other symptoms and signs involving the musculoskeletal system  Muscle weakness (generalized)  ONSET DATE: 10/15/21  SUBJECTIVE:  SUBJECTIVE STATEMENT:  11/22/21: Patient reports that he has been doing exercises at home without difficulty. He is sleeping for longer without awakening due to pain. He is pleased with his progress. Wants to work on his balance.    Evaluation: Back pain increasing over the past month. He has had intermittent pain in the LB for several years. He is having difficulty sleeping. He has some pain in the Rt buttocks and leg after 2-3 hours of sleep.   PERTINENT HISTORY:  LBP and Rt hip pain since 1991 when he was playing softball. He has had some intermittent back and hip since time of injury. Hx of cardiac bypass; HTN; AODM;   PAIN:  Are you having pain? Yes: NPRS scale: 0/10 Pain location: Rt buttock  Pain description: dull aching less sharp; no radiating pain through calf to ankle  Aggravating factors: lying down sleeping; sitting > 1 hour Relieving factors: standing; walking; meds    PRECAUTIONS: None  WEIGHT BEARING RESTRICTIONS No  FALLS:  Has patient fallen in last 6 months? 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 X-ray - Mild thoracolumbar dextroscoliosis; Bilateral nephrolithiasis.  PATIENT SURVEYS:  FOTO 50   COGNITION:  Overall cognitive status: Within functional limits for tasks assessed     SENSATION: WFL  MUSCLE LENGTH: Hamstrings: Right 65 deg; Left 70 deg Thomas test: tight Rt > Lt   POSTURE:  head forward; shoulders rounded; slightly flexed forward at hips  PALPATION: Muscular tightness Rt posterior hip lateral sacral border to posterior greater trochanter; piriformis; glut med   LUMBAR ROM:   Active  A/PROM  eval  Flexion 75% pulling Rt HS to posterior knee  Extension 75%  Right lateral flexion 100%  Left lateral flexion 90%  Right rotation 80%  Left rotation 80%   (Blank rows = not tested)  LOWER EXTREMITY ROM:     Tight end ranges Rt hip rotation, flexion; bilat hip ext   LOWER EXTREMITY MMT:    MMT Right eval Left eval  Hip flexion    Hip extension    Hip abduction 4/5 5/5  Hip adduction    Hip internal rotation    Hip external rotation    Knee flexion    Knee extension    Ankle dorsiflexion    Ankle plantarflexion    Ankle inversion    Ankle eversion     (Blank rows = not tested)  LUMBAR SPECIAL TESTS:  Straight leg raise test: Negative and Slump test: Negative  FUNCTIONAL TESTS:  SLS: Rt 2-3 sec; Lt 5-7 sec    GAIT: Distance walked: 40 Assistive device utilized: None Level of assistance: Complete Independence Comments: Gait WNL's     TODAY'S TREATMENT  11/22/21:  Encouraged modifications for sitting in recliner which Daeron has not done yet He has made for modifications for sleeping Lt sidelying   Therapeutic exercise: Supine:  Piriformis stretch travell varying angles 30 sec x 5   Hamstring stretch 30 sec x 2 reps  ITB stretch 30 sec x 3 reps  Hip abduction hooklying  red TB x 10 ea side Bridge 3 sec x 10 reps core engaged  Prone: Prone press up 2 sec x  10  Glut set x 5 reps Hip extension alternating 3 sec x 10   Standing: Hip abduction 2 sec leading with heel x 5   Standing trunk extension 2 sec x 2 reps Row blue TB x 10 Bow and arrow blue TB x 10  Antirotation blue TB x 10  SLS 10 sec x 3  SLS diver x 5       PATIENT EDUCATION:  Education details: POC HEP Person educated: Patient Education method: Consulting civil engineer, Demonstration, Tactile cues, Verbal cues, and Handouts Education comprehension: verbalized understanding, returned demonstration, verbal cues required, tactile cues required, and needs further education   HOME EXERCISE PROGRAM: Access Code: [email or web address] URL: [email or web address] Date: 11/16/2021 Prepared by: Gillermo Murdoch  Exercises - Prone Press Up  - 2 x daily - 7 x weekly - 1 sets - 10 reps - 2-3 sec  hold - Supine Piriformis Stretch with Leg Straight  - 2 x daily - 7 x weekly - 1 sets - 3 reps - 30 sec  hold - Hooklying Hamstring Stretch with Strap  - 2 x daily - 7 x weekly - 1 sets - 3 reps - 30 sec  hold - Supine ITB Stretch with Strap  - 2 x daily - 7 x weekly - 1 sets - 3 reps - 30 sec  hold - Standing Lumbar Extension  - 2 x daily - 7 x weekly - 1 sets - 2-3 reps - 2-3 sec  hold - Standing Hip Abduction with Counter Support  - 2 x daily - 7 x weekly - 2-3 sets - 10 reps - 2-3 sec  hold  ASSESSMENT:  CLINICAL IMPRESSION:  11/22/21: Excellent response to initial exercise program and some modification of activity level at home. Reviewed and progressed with therapeutic exercises. Added core stabilization exercises and balance exercises without difficulty. Progressing well toward goals of therapy.    Evaluation: Patient is a 71 y.o. male who was seen today for physical therapy evaluation and treatment for Rt posterior hip and radicular pain into the Rt posterior thigh to lateral calf. He has limited mobility in  trunk and LE; weakness in Rt hip; limited functional activities; pain with functional activities; pain and difficulty with sleeping. He will benefit from PT to address problems.     OBJECTIVE IMPAIRMENTS decreased activity tolerance, decreased mobility, decreased ROM, decreased strength, increased fascial restrictions, impaired flexibility, improper body mechanics, postural dysfunction, and pain.   ACTIVITY LIMITATIONS standing and sleeping  PARTICIPATION LIMITATIONS:  prolonged sitting   PERSONAL FACTORS Time since onset of injury/illness/exacerbation and 1-2 comorbidities: HTN; cardiovascular surgery  are also affecting patient's functional outcome.   REHAB POTENTIAL: Good  CLINICAL DECISION MAKING: Stable/uncomplicated  EVALUATION COMPLEXITY: Low   GOALS: Goals reviewed with patient? Yes   LONG TERM GOALS: Target date: 01/03/2022  Decrease radicular Rt LE pain by 75-100%  Baseline:  Goal status: INITIAL  2.  Patient to reports sleeping for 6-8 hours without awakening due to pain  Baseline:  Goal status: INITIAL  3.  5/5 strength Rt hip abductors  Baseline:  Goal status: INITIAL  4.  Independent in HEP (including aquatic program as indicated) Baseline:  Goal status: INITIAL  5.  Improve functional limitation score to 62 Baseline:  Goal status: INITIAL    PLAN: PT FREQUENCY: 2x/week  PT DURATION: 6 weeks  PLANNED INTERVENTIONS: Therapeutic exercises, Therapeutic activity, Neuromuscular re-education, Balance training, Gait training, Patient/Family education, Self Care, Joint mobilization, Aquatic Therapy, Dry Needling, Electrical stimulation, Cryotherapy, Moist heat,  Taping, Manual therapy, and Re-evaluation.  PLAN FOR NEXT SESSION: continue with HEP; progress with stretching and strengthening including core stabilization; balance: manual work and modalities as indicated   Wladyslawa Disbro Nilda Simmer, PT, MPH 11/22/2021, 10:22 AM

## 2021-11-23 LAB — LIPID PANEL
Cholesterol: 124 (ref 0–200)
HDL: 39 (ref 35–70)
LDL Cholesterol: 74
Triglycerides: 48 (ref 40–160)

## 2021-11-23 LAB — HEPATIC FUNCTION PANEL
ALT: 20 U/L (ref 10–40)
AST: 23 (ref 14–40)
Alkaline Phosphatase: 68 (ref 25–125)
Bilirubin, Total: 0.6

## 2021-11-23 LAB — COMPREHENSIVE METABOLIC PANEL
Albumin: 3.7 (ref 3.5–5.0)
Calcium: 9.1 (ref 8.7–10.7)
eGFR: 88

## 2021-11-23 LAB — PROTEIN / CREATININE RATIO, URINE: Creatinine, Urine: 139

## 2021-11-23 LAB — BASIC METABOLIC PANEL
BUN: 27 — AB (ref 4–21)
CO2: 29 — AB (ref 13–22)
Chloride: 107 (ref 99–108)
Creatinine: 0.9 (ref 0.6–1.3)
Glucose: 103
Potassium: 4.6 mEq/L (ref 3.5–5.1)
Sodium: 140 (ref 137–147)

## 2021-11-23 LAB — HEMOGLOBIN A1C: Hemoglobin A1C: 7.2

## 2021-11-23 LAB — TSH: TSH: 1.27 (ref 0.41–5.90)

## 2021-11-23 LAB — MICROALBUMIN, URINE: Microalb, Ur: 0.81

## 2021-11-24 ENCOUNTER — Encounter: Payer: Self-pay | Admitting: Rehabilitative and Restorative Service Providers"

## 2021-11-24 ENCOUNTER — Ambulatory Visit: Payer: Medicare Other | Admitting: Rehabilitative and Restorative Service Providers"

## 2021-11-24 DIAGNOSIS — R29898 Other symptoms and signs involving the musculoskeletal system: Secondary | ICD-10-CM

## 2021-11-24 DIAGNOSIS — M5416 Radiculopathy, lumbar region: Secondary | ICD-10-CM | POA: Diagnosis not present

## 2021-11-24 DIAGNOSIS — M6281 Muscle weakness (generalized): Secondary | ICD-10-CM

## 2021-11-24 NOTE — Therapy (Signed)
OUTPATIENT PHYSICAL THERAPY THORACOLUMBAR EVALUATION   Patient Name: Jeremy Johnston MRN: [id number] DOB:[birth year], 71 y.o., male Today's Date: 11/24/2021   PT End of Session - 11/24/21 0933     Visit Number 3    Number of Visits 12    Date for PT Re-Evaluation 12/28/21    PT Start Time 0931    PT Stop Time 1015    PT Time Calculation (min) 44 min    Activity Tolerance Patient tolerated treatment well             Past Medical History:  Diagnosis Date   Arthritis    Cancer ([place])    kidney cancer-both kidneys remain   Chronic kidney disease    Robotic Kidney surgery for kidney cancer tumor-kidney remains-   Coronary artery disease    Diabetes mellitus ([place])    Insulin pump -[name], and has a glucose transmitter attached.   Diabetic retinopathy ([place])    left eye   Hyperlipidemia    Hypertension    Sciatica    Vitreous hemorrhage ([facility])    left eye   Past Surgical History:  Procedure Laterality Date   CATARACT EXTRACTION, BILATERAL Bilateral    11'15   CORONARY ARTERY BYPASS GRAFT  01/2007   x4 vessels([place])   DIAGNOSTIC LAPAROSCOPY     EYE SURGERY     retina peele   HERNIA REPAIR     KIDNEY SURGERY     tumor removed   PARS PLANA VITRECTOMY 27 GAUGE Left 12/12/2016   Procedure: PARS PLANA VITRECTOMY 37 GAUGE , ENDOLASER GAS/ FLUID EXCHAGNE;  Surgeon: Hayden Pedro, MD;  Location: [facility];  Service: Ophthalmology;  Laterality: Left;   VASECTOMY     Patient Active Problem List   Diagnosis Date Noted   Right lumbar radiculitis 11/10/2021   Type 1 diabetes mellitus with diabetic polyneuropathy ([place]) 08/14/2021   Proliferative retinopathy due to type 1 diabetes mellitus ([place]) 08/14/2021   Personal history of other malignant neoplasm of kidney 08/14/2021   Right distal biceps tendon tear 02/07/2019   Vitreous hemorrhage, left eye ([place]) 12/12/2016   Hypertensive heart disease 10/17/2016   Essential hypertension 71/09/2692   Complications affecting other  specified body systems, hypertension 04/17/2013   Mixed hyperlipidemia 04/17/2013   Coronary atherosclerosis of native coronary artery 04/17/2013   Bilateral carotid artery disease ([place]) 04/17/2013   Nonspecific abnormal electrocardiogram (ECG) (EKG) 04/17/2013    PCP: Samuel Bouche, NP  REFERRING PROVIDER: Dr Roma Schanz DIAG: Rt lumbar radiculitis  Rationale for Evaluation and Treatment Rehabilitation  THERAPY DIAG:  Radiculopathy, lumbar region  Other symptoms and signs involving the musculoskeletal system  Muscle weakness (generalized)  ONSET DATE: 10/15/21  SUBJECTIVE:  SUBJECTIVE STATEMENT:  11/24/21: Patient reports that he had some increased pain in during the night Tuesday night. He slept well last night. He did start bowling again and he had a bowling lesson yesterday. He stretches before bowling but not after. Suggested that patient try to do a little stretching after bowling. He has been doing exercises at home without difficulty. Wants to work on his balance.    Evaluation: Back pain increasing over the past month. He has had intermittent pain in the LB for several years. He is having difficulty sleeping. He has some pain in the Rt buttocks and leg after 2-3 hours of sleep.   PERTINENT HISTORY:  LBP and Rt hip pain since 1991 when he was playing softball. He has had some intermittent back and hip since time of injury. Hx of cardiac bypass; HTN; AODM;   PAIN:  Are you having pain? Yes: NPRS scale: 0/10 Pain location: Rt buttock  Pain description: dull aching less sharp; no radiating pain through calf to ankle  Aggravating factors: lying down sleeping; sitting > 1 hour Relieving factors: standing; walking; meds    PRECAUTIONS: None  WEIGHT BEARING RESTRICTIONS  No  FALLS:  Has patient fallen in last 6 months? 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 X-ray - Mild thoracolumbar dextroscoliosis; Bilateral nephrolithiasis.  PATIENT SURVEYS:  FOTO 50   COGNITION:  Overall cognitive status: Within functional limits for tasks assessed     SENSATION: WFL  MUSCLE LENGTH: Hamstrings: Right 65 deg; Left 70 deg Thomas test: tight Rt > Lt   POSTURE:  head forward; shoulders rounded; slightly flexed forward at hips  PALPATION: Muscular tightness Rt posterior hip lateral sacral border to posterior greater trochanter; piriformis; glut med   LUMBAR ROM:   Active  A/PROM  eval  Flexion 75% pulling Rt HS to posterior knee  Extension 75%  Right lateral flexion 100%  Left lateral flexion 90%  Right rotation 80%  Left rotation 80%   (Blank rows = not tested)  LOWER EXTREMITY ROM:     Tight end ranges Rt hip rotation, flexion; bilat hip ext   LOWER EXTREMITY MMT:    MMT Right eval Left eval  Hip flexion    Hip extension    Hip abduction 4/5 5/5  Hip adduction    Hip internal rotation    Hip external rotation    Knee flexion    Knee extension    Ankle dorsiflexion    Ankle plantarflexion    Ankle inversion    Ankle eversion     (Blank rows = not tested)  LUMBAR SPECIAL TESTS:  Straight leg raise test: Negative and Slump test: Negative  FUNCTIONAL TESTS:  SLS: Rt 2-3 sec; Lt 5-7 sec    GAIT: Distance walked: 40 Assistive device utilized: None Level of assistance: Complete Independence Comments: Gait WNL's    Palpation: 11/24/21 tightness noted Rt > Lt posterior hip through the piriformis and glut musculature       TODAY'S TREATMENT  11/24/21:  Encouraged modifications for sitting in recliner which Wymon has not done yet He has made for modifications for sleeping Lt sidelying   Therapeutic exercise:  Treadmill x 5 min  1.5 mph  Supine:  Piriformis stretch travell varying angles 30 sec x 5   Hamstring stretch 30 sec x 2 reps  ITB stretch 30 sec x 3 reps  Hip abduction hooklying red TB x 10 ea side Bridge 3 sec x 10 reps core engaged  Prone: Prone press up 2 sec x 10  Glut set 10 sec x 5 reps Hip extension alternating 3 sec x 10 reps   Standing: Hip abduction 2 sec leading with heel x 5   Standing trunk extension 2 sec x 2 reps  Row blue TB x 10  Bow and arrow blue TB x 10   Antirotation blue TB x 10   SLS 10 sec x 3   SLS diver x 5  Counter plank 60 sec x2 Gastroc stretch 30 sec x 2 reps    Manual work: deep tissue work bilat posterior hips through the piriformis and glut muscles; passive Quad stretch 20 sec x 3 each side; hip IR/ER with knee in flexion each side  Myofacial ball release bilat posterior buttocks supine and standing at wall ~ 4 inch plastic ball     PATIENT EDUCATION:  Education details: POC HEP Person educated: Patient Education method: Consulting civil engineer, Media planner, Corporate treasurer cues, Verbal cues, and Handouts Education comprehension: verbalized understanding, returned demonstration, verbal cues required, tactile cues required, and needs further education   HOME EXERCISE PROGRAM:  Access Code: [email or web address] URL: [email or web address] Date: 11/24/2021 Prepared by: Gillermo Murdoch  Exercises - Prone Press Up  - 1 x daily - 7 x weekly - 1 sets - 10 reps - 2-3 sec  hold - Supine Piriformis Stretch with Leg Straight  - 1 x daily - 7 x weekly - 1 sets - 3 reps - 30 sec  hold - Hooklying Hamstring Stretch with Strap  - 1 x daily - 7 x weekly - 1 sets - 3 reps - 30 sec  hold - Supine ITB Stretch with Strap  - 1 x daily - 7 x weekly - 1 sets - 3 reps - 30 sec   hold - Standing Lumbar Extension  - 1 x daily - 7 x weekly - 1 sets - 2-3 reps - 2-3 sec  hold - Standing Hip Abduction with Counter Support  - 1 x daily - 7 x weekly - 2-3 sets - 10 reps - 2-3 sec  hold - Prone Gluteal Sets  - 1 x daily - 7 x weekly - 1 sets - 10 reps - 10 sec  hold - Prone Hip Extension  - 1 x daily - 7 x weekly - 1 sets - 10 reps - 3 sec  hold - Hooklying Isometric Clamshell  - 1 x daily - 7 x weekly - 1 sets - 10 reps - 3 sec  hold - Supine Bridge  - 1 x daily - 7 x weekly - 1-2 sets - 10 reps - 3-5 sec  hold - Standing Bilateral Low Shoulder Row with Anchored Resistance  - 2 x daily - 7 x weekly - 1-3 sets - 10 reps - 2-3 sec  hold - Drawing Bow  - 1 x daily - 7 x weekly - 1 sets - 10 reps - 3 sec  hold - Anti-Rotation Lateral Stepping with Press  - 2 x daily - 7 x weekly - 1-2 sets - 10 reps - 2-3 sec  hold - Single Leg Stance  - 2 x daily - 7 x weekly - 2 sets - 5 reps -  20 sec hold - The Diver  - 2 x daily - 7 x weekly - 1 sets - 10 reps - 2-3 sec  hold - Standing Piriformis Release with Ball at [employer]  - 2 x daily - 7 x weekly - 30-60 sec  hold - Plank on Counter  - 2 x daily - 7 x weekly - 1 sets - 3 reps - 30 sec  hold - Gastroc Stretch on Wall  - 2 x daily - 7 x weekly - 1 sets - 3 reps - 30 sec  hold  ASSESSMENT:  CLINICAL IMPRESSION:  11/24/21: Some increased tightness in and aching in the Rt > Lt posterior hips which may have been related to new exercises last visit as well as activities associated with bowling this week. Reviewed and progressed with therapeutic exercises. Added myofacial ball release work supine and standing. Progressing well toward goals of therapy.    Evaluation: Patient is a 71 y.o. male who was seen today for physical therapy evaluation and treatment for Rt posterior hip and radicular pain into the Rt posterior thigh to lateral calf. He has limited mobility in trunk and LE; weakness in Rt hip; limited functional activities; pain with  functional activities; pain and difficulty with sleeping. He will benefit from PT to address problems.     OBJECTIVE IMPAIRMENTS decreased activity tolerance, decreased mobility, decreased ROM, decreased strength, increased fascial restrictions, impaired flexibility, improper body mechanics, postural dysfunction, and pain.   ACTIVITY LIMITATIONS standing and sleeping  PARTICIPATION LIMITATIONS:  prolonged sitting   PERSONAL FACTORS Time since onset of injury/illness/exacerbation and 1-2 comorbidities: HTN; cardiovascular surgery  are also affecting patient's functional outcome.   REHAB POTENTIAL: Good  CLINICAL DECISION MAKING: Stable/uncomplicated  EVALUATION COMPLEXITY: Low   GOALS: Goals reviewed with patient? Yes   LONG TERM GOALS: Target date: 01/05/2022  Decrease radicular Rt LE pain by 75-100%  Baseline:  Goal status: INITIAL  2.  Patient to reports sleeping for 6-8 hours without awakening due to pain  Baseline:  Goal status: INITIAL  3.  5/5 strength Rt hip abductors  Baseline:  Goal status: INITIAL  4.  Independent in HEP (including aquatic program as indicated) Baseline:  Goal status: INITIAL  5.  Improve functional limitation score to 62 Baseline:  Goal status: INITIAL    PLAN: PT FREQUENCY: 2x/week  PT DURATION: 6 weeks  PLANNED INTERVENTIONS: Therapeutic exercises, Therapeutic activity, Neuromuscular re-education, Balance training, Gait training, Patient/Family education, Self Care, Joint mobilization, Aquatic Therapy, Dry Needling, Electrical stimulation, Cryotherapy, Moist heat, Taping, Manual therapy, and Re-evaluation.  PLAN FOR NEXT SESSION: continue with HEP; progress with stretching and strengthening including core stabilization; balance: manual work and modalities as indicated   Aamirah Salmi Nilda Simmer, PT, MPH 11/24/2021, 10:15 AM

## 2021-11-28 ENCOUNTER — Other Ambulatory Visit: Payer: Self-pay | Admitting: Interventional Cardiology

## 2021-11-28 ENCOUNTER — Encounter (INDEPENDENT_AMBULATORY_CARE_PROVIDER_SITE_OTHER): Payer: Medicare Other | Admitting: Ophthalmology

## 2021-11-28 DIAGNOSIS — E113513 Type 2 diabetes mellitus with proliferative diabetic retinopathy with macular edema, bilateral: Secondary | ICD-10-CM | POA: Diagnosis not present

## 2021-11-28 DIAGNOSIS — H35033 Hypertensive retinopathy, bilateral: Secondary | ICD-10-CM | POA: Diagnosis not present

## 2021-11-28 DIAGNOSIS — H43811 Vitreous degeneration, right eye: Secondary | ICD-10-CM | POA: Diagnosis not present

## 2021-11-28 DIAGNOSIS — I10 Essential (primary) hypertension: Secondary | ICD-10-CM

## 2021-11-28 DIAGNOSIS — I6523 Occlusion and stenosis of bilateral carotid arteries: Secondary | ICD-10-CM

## 2021-11-29 ENCOUNTER — Encounter: Payer: No Typology Code available for payment source | Admitting: Rehabilitative and Restorative Service Providers"

## 2021-12-01 ENCOUNTER — Encounter: Payer: Self-pay | Admitting: Rehabilitative and Restorative Service Providers"

## 2021-12-01 ENCOUNTER — Ambulatory Visit: Payer: Medicare Other | Admitting: Rehabilitative and Restorative Service Providers"

## 2021-12-01 DIAGNOSIS — M5416 Radiculopathy, lumbar region: Secondary | ICD-10-CM | POA: Diagnosis not present

## 2021-12-01 DIAGNOSIS — M6281 Muscle weakness (generalized): Secondary | ICD-10-CM

## 2021-12-01 DIAGNOSIS — R29898 Other symptoms and signs involving the musculoskeletal system: Secondary | ICD-10-CM

## 2021-12-01 NOTE — Therapy (Signed)
OUTPATIENT PHYSICAL THERAPY THORACOLUMBAR EVALUATION   Patient Name: Shjon Lizarraga MRN: [id number] DOB:1950/06/04, 71 y.o., male Today's Date: 12/01/2021   PT End of Session - 12/01/21 1013     Visit Number 4    Number of Visits 12    Date for PT Re-Evaluation 12/28/21    PT Start Time 41    PT Stop Time 1100    PT Time Calculation (min) 47 min             Past Medical History:  Diagnosis Date   Arthritis    Cancer ([place])    kidney cancer-both kidneys remain   Chronic kidney disease    Robotic Kidney surgery for kidney cancer tumor-kidney remains-   Coronary artery disease    Diabetes mellitus ([place])    Insulin pump -[name], and has a glucose transmitter attached.   Diabetic retinopathy ([place])    left eye   Hyperlipidemia    Hypertension    Sciatica    Vitreous hemorrhage ([facility])    left eye   Past Surgical History:  Procedure Laterality Date   CATARACT EXTRACTION, BILATERAL Bilateral    11'15   CORONARY ARTERY BYPASS GRAFT  01/2007   x4 vessels([place])   DIAGNOSTIC LAPAROSCOPY     EYE SURGERY     retina peele   HERNIA REPAIR     KIDNEY SURGERY     tumor removed   PARS PLANA VITRECTOMY 27 GAUGE Left 12/12/2016   Procedure: PARS PLANA VITRECTOMY 67 GAUGE , ENDOLASER GAS/ FLUID EXCHAGNE;  Surgeon: Hayden Pedro, MD;  Location: [facility];  Service: Ophthalmology;  Laterality: Left;   VASECTOMY     Patient Active Problem List   Diagnosis Date Noted   Right lumbar radiculitis 11/10/2021   Type 1 diabetes mellitus with diabetic polyneuropathy ([place]) 08/14/2021   Proliferative retinopathy due to type 1 diabetes mellitus ([place]) 08/14/2021   Personal history of other malignant neoplasm of kidney 08/14/2021   Right distal biceps tendon tear 02/07/2019   Vitreous hemorrhage, left eye ([place]) 12/12/2016   Hypertensive heart disease 10/17/2016   Essential hypertension 97/67/3419   Complications affecting other specified body systems, hypertension 04/17/2013   Mixed  hyperlipidemia 04/17/2013   Coronary atherosclerosis of native coronary artery 04/17/2013   Bilateral carotid artery disease ([place]) 04/17/2013   Nonspecific abnormal electrocardiogram (ECG) (EKG) 04/17/2013    PCP: Samuel Bouche, NP  REFERRING PROVIDER: Dr Roma Schanz DIAG: Rt lumbar radiculitis  Rationale for Evaluation and Treatment Rehabilitation  THERAPY DIAG:  Radiculopathy, lumbar region  Other symptoms and signs involving the musculoskeletal system  Muscle weakness (generalized)  ONSET DATE: 10/15/21  SUBJECTIVE:  SUBJECTIVE STATEMENT:  12/01/21: Patient reports that he is improving. E thinks that he had a reaction to the meloxicam so he has stopped taking it. Suggested pt contact his MD or pharmacist. He is still bowling. He stretches before and after bowling now. He has been doing exercises at home without difficulty. Wants to continue to work on his balance.    Evaluation: Back pain increasing over the past month. He has had intermittent pain in the LB for several years. He is having difficulty sleeping. He has some pain in the Rt buttocks and leg after 2-3 hours of sleep.   PERTINENT HISTORY:  LBP and Rt hip pain since 1991 when he was playing softball. He has had some intermittent back and hip since time of injury. Hx of cardiac bypass; HTN; AODM;   PAIN:  Are you having pain? Yes: NPRS scale: 0/10 Pain location: Rt buttock  Pain description: dull aching less sharp; no radiating pain through calf to ankle  Aggravating factors: lying down sleeping; sitting > 1 hour Relieving factors: standing; walking; meds    PRECAUTIONS: None  WEIGHT BEARING RESTRICTIONS No  FALLS:  Has patient fallen in last 6 months? 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 X-ray - Mild thoracolumbar dextroscoliosis; Bilateral nephrolithiasis.  PATIENT SURVEYS:  FOTO 50   COGNITION:  Overall cognitive status: Within functional limits for tasks assessed     SENSATION: WFL  MUSCLE LENGTH: Hamstrings: Right 65 deg; Left 70 deg Thomas test: tight Rt > Lt   POSTURE:  head forward; shoulders rounded; slightly flexed forward at hips  PALPATION: Muscular tightness Rt posterior hip lateral sacral border to posterior greater trochanter; piriformis; glut med   LUMBAR ROM:   Active  A/PROM  eval  Flexion 75% pulling Rt HS to posterior knee  Extension 75%  Right lateral flexion 100%  Left lateral flexion 90%  Right rotation 80%  Left rotation 80%   (Blank rows = not tested)  LOWER EXTREMITY ROM:     Tight end ranges Rt hip rotation, flexion; bilat hip ext   LOWER EXTREMITY MMT:    MMT Right eval Left eval  Hip flexion    Hip extension    Hip abduction 4/5 5/5  Hip adduction    Hip internal rotation    Hip external rotation    Knee flexion    Knee extension    Ankle dorsiflexion    Ankle plantarflexion    Ankle inversion    Ankle eversion     (Blank rows = not tested)  LUMBAR SPECIAL TESTS:  Straight leg raise test: Negative and Slump test: Negative  FUNCTIONAL TESTS:  SLS: Rt 2-3 sec; Lt 5-7 sec    GAIT: Distance walked: 40 Assistive device utilized: None Level of assistance: Complete Independence Comments: Gait WNL's    Palpation: 11/24/21 tightness noted Rt > Lt posterior hip through the piriformis and glut musculature      TODAY'S TREATMENT  12/01/21:  Encouraged modifications for sitting in recliner which Daelen has not done yet He has  made for modifications for sleeping Lt sidelying   Therapeutic exercise: exercises today (.)  .Treadmill x 5 min 1.0 to 1.5 mph  Supine:  Piriformis stretch travell varying angles 30 sec x 5   Hamstring stretch 30 sec x 2 reps  ITB stretch 30 sec x 3 reps  .Hip abduction hooklying green TB x 10 ea side .Bridge 3 sec x 10 reps green TB distal thigh c   Prone: Prone press up 2 sec x 10  Glut set 10 sec x 5 reps Hip extension alternating 3 sec x 10 reps   Standing: Hip abduction 2 sec leading with heel green TB x 5   Standing trunk extension 2 sec x 2 reps  .Row blue TB x 10  .Bow and arrow black TB x 10   .Antirotation black TB x 10   .SLS 10 sec x 3   SLS diver x 5   Counter plank 60 sec x2  .Gastroc stretch 30 sec x 2 reps  .Side steps green TB x 10 ft x 5 each direction .Single arm extension black TB x 10    PATIENT EDUCATION:  Education details: POC HEP Person educated: Patient Education method: Explanation, Demonstration, Tactile cues, Verbal cues, and Handouts Education comprehension: verbalized understanding, returned demonstration, verbal cues required, tactile cues required, and needs further education   HOME EXERCISE PROGRAM:  Access Code: [email or web address] URL: [email or web address] Date: 12/01/2021 Prepared by: Gillermo Murdoch  Exercises - Prone Press Up  - 1 x daily - 7 x weekly - 1 sets - 10 reps - 2-3 sec  hold - Supine Piriformis Stretch with Leg Straight  - 1 x daily - 7 x weekly - 1 sets - 3 reps - 30 sec  hold - Hooklying Hamstring Stretch with Strap  - 1 x daily - 7 x weekly - 1 sets - 3 reps - 30 sec  hold - Supine ITB Stretch with Strap  - 1 x daily - 7 x weekly - 1 sets - 3 reps - 30 sec  hold - Standing Lumbar Extension  - 1 x daily - 7 x weekly - 1 sets - 2-3 reps - 2-3 sec  hold - Standing Hip Abduction with Counter Support  - 1 x daily - 7 x weekly - 2-3 sets - 10 reps - 2-3 sec  hold - Prone Gluteal Sets  - 1 x daily - 7 x weekly - 1  sets - 10 reps - 10 sec  hold - Prone Hip Extension  - 1 x daily - 7 x weekly - 1 sets - 10 reps - 3 sec  hold - Hooklying Isometric Clamshell  - 1 x daily - 7 x weekly - 1 sets - 10 reps - 3 sec  hold - Supine Bridge  - 1 x daily - 7 x weekly - 1-2 sets - 10 reps - 3-5 sec  hold - Standing Bilateral Low Shoulder Row with Anchored Resistance  - 2 x daily - 7 x weekly - 1-3 sets - 10 reps - 2-3 sec  hold - Drawing Bow  - 1 x daily - 7 x weekly - 1 sets - 10 reps - 3 sec  hold - Anti-Rotation Lateral Stepping with Press  - 2 x daily - 7 x weekly - 1-2 sets - 10 reps - 2-3 sec  hold - Single Leg Stance  - 2 x daily - 7 x weekly - 2 sets - 5 reps - 20 sec hold - The Diver  - 2 x daily - 7 x weekly - 1 sets - 10 reps - 2-3 sec  hold -  Standing Piriformis Release with Ball at [employer]  - 2 x daily - 7 x weekly - 30-60 sec  hold - Plank on Counter  - 2 x daily - 7 x weekly - 1 sets - 3 reps - 30 sec  hold - Gastroc Stretch on Wall  - 1 x daily - 7 x weekly - 1 sets - 3 reps - 30 sec  hold - Bridge with Hip Abduction and Resistance  - 1 x daily - 7 x weekly - 1-2 sets - 10 reps - 3 sec  hold - Standing Hip Abduction with Resistance at Thighs  - 1 x daily - 7 x weekly - 2-3 sets - 10 reps - 3 sec  hold - Side Stepping with Resistance at Thighs  - 1 x daily - 7 x weekly - Single Arm Shoulder Extension with Anchored Resistance  - 1 x daily - 7 x weekly - 1 sets - 10 reps - 3 sec  hold  ASSESSMENT:  CLINICAL IMPRESSION:  12/01/21: Patient reports continued improvement with no back pain. He is working on his exercises. Reviewed and progressed with therapeutic exercises. Added resistive exercises for strengthening hips and shoulder extension Rt UE with TB to assist with bowling technique. Progressing well toward goals of therapy.    Evaluation: Patient is a 71 y.o. male who was seen today for physical therapy evaluation and treatment for Rt posterior hip and radicular pain into the Rt posterior thigh to lateral  calf. He has limited mobility in trunk and LE; weakness in Rt hip; limited functional activities; pain with functional activities; pain and difficulty with sleeping. He will benefit from PT to address problems.     OBJECTIVE IMPAIRMENTS decreased activity tolerance, decreased mobility, decreased ROM, decreased strength, increased fascial restrictions, impaired flexibility, improper body mechanics, postural dysfunction, and pain.   ACTIVITY LIMITATIONS standing and sleeping  PARTICIPATION LIMITATIONS:  prolonged sitting   PERSONAL FACTORS Time since onset of injury/illness/exacerbation and 1-2 comorbidities: HTN; cardiovascular surgery  are also affecting patient's functional outcome.   REHAB POTENTIAL: Good  CLINICAL DECISION MAKING: Stable/uncomplicated  EVALUATION COMPLEXITY: Low   GOALS: Goals reviewed with patient? Yes   LONG TERM GOALS: Target date: 01/12/2022  Decrease radicular Rt LE pain by 75-100%  Baseline:  Goal status: INITIAL  2.  Patient to reports sleeping for 6-8 hours without awakening due to pain  Baseline:  Goal status: INITIAL  3.  5/5 strength Rt hip abductors  Baseline:  Goal status: INITIAL  4.  Independent in HEP (including aquatic program as indicated) Baseline:  Goal status: INITIAL  5.  Improve functional limitation score to 62 Baseline:  Goal status: INITIAL    PLAN: PT FREQUENCY: 2x/week  PT DURATION: 6 weeks  PLANNED INTERVENTIONS: Therapeutic exercises, Therapeutic activity, Neuromuscular re-education, Balance training, Gait training, Patient/Family education, Self Care, Joint mobilization, Aquatic Therapy, Dry Needling, Electrical stimulation, Cryotherapy, Moist heat, Taping, Manual therapy, and Re-evaluation.  PLAN FOR NEXT SESSION: continue with HEP; progress with stretching and strengthening including core stabilization; balance: manual work and modalities as indicated   Jedadiah Abdallah Nilda Simmer, PT, MPH 12/01/2021, 10:54 AM

## 2021-12-02 ENCOUNTER — Ambulatory Visit (HOSPITAL_COMMUNITY)
Admission: RE | Admit: 2021-12-02 | Discharge: 2021-12-02 | Disposition: A | Discharge status: Home / Self Care | Payer: Medicare Other | Source: Ambulatory Visit | Attending: Cardiology | Admitting: Cardiology

## 2021-12-02 DIAGNOSIS — I6523 Occlusion and stenosis of bilateral carotid arteries: Secondary | ICD-10-CM | POA: Insufficient documentation

## 2021-12-06 ENCOUNTER — Encounter: Payer: No Typology Code available for payment source | Admitting: Rehabilitative and Restorative Service Providers"

## 2021-12-08 ENCOUNTER — Encounter: Payer: Self-pay | Admitting: Rehabilitative and Restorative Service Providers"

## 2021-12-08 ENCOUNTER — Ambulatory Visit: Payer: Medicare Other | Attending: Sports Medicine | Admitting: Rehabilitative and Restorative Service Providers"

## 2021-12-08 DIAGNOSIS — M5416 Radiculopathy, lumbar region: Secondary | ICD-10-CM | POA: Insufficient documentation

## 2021-12-08 DIAGNOSIS — R29898 Other symptoms and signs involving the musculoskeletal system: Secondary | ICD-10-CM | POA: Insufficient documentation

## 2021-12-08 DIAGNOSIS — M6281 Muscle weakness (generalized): Secondary | ICD-10-CM | POA: Diagnosis present

## 2021-12-08 NOTE — Therapy (Addendum)
OUTPATIENT PHYSICAL THERAPY TREATMENT AND DISCHARGE SUMMARY   PHYSICAL THERAPY DISCHARGE SUMMARY  Visits from Start of Care: 5  Current functional level related to goals / functional outcomes: SEE PROGRESS NOTE FOR DISCHARGE STATUS   Remaining deficits: UNKNOWN    Education / Equipment: HEP   Patient agrees to discharge. Patient goals were not met. Patient is being discharged due to not returning since the last visit.  Xin Klawitter P. Helene Kelp PT, MPH 03/15/22 1:39 PM   Patient Name: Jeremy Johnston MRN: [id number] DOB:[birth year], 71 y.o., male Today's Date: 12/08/2021   PT End of Session - 12/08/21 1023     Visit Number 5    Number of Visits 12    Date for PT Re-Evaluation 12/28/21    PT Start Time 1017    PT Stop Time 1100    PT Time Calculation (min) 43 min    Activity Tolerance Patient tolerated treatment well             Past Medical History:  Diagnosis Date   Arthritis    Cancer ([place])    kidney cancer-both kidneys remain   Chronic kidney disease    Robotic Kidney surgery for kidney cancer tumor-kidney remains-   Coronary artery disease    Diabetes mellitus ([place])    Insulin pump -[name], and has a glucose transmitter attached.   Diabetic retinopathy ([place])    left eye   Hyperlipidemia    Hypertension    Sciatica    Vitreous hemorrhage ([facility])    left eye   Past Surgical History:  Procedure Laterality Date   CATARACT EXTRACTION, BILATERAL Bilateral    11'15   CORONARY ARTERY BYPASS GRAFT  01/2007   x4 vessels([place])   DIAGNOSTIC LAPAROSCOPY     EYE SURGERY     retina peele   HERNIA REPAIR     KIDNEY SURGERY     tumor removed   PARS PLANA VITRECTOMY 27 GAUGE Left 12/12/2016   Procedure: PARS PLANA VITRECTOMY 31 GAUGE , ENDOLASER GAS/ FLUID EXCHAGNE;  Surgeon: Hayden Pedro, MD;  Location: [facility];  Service: Ophthalmology;  Laterality: Left;   VASECTOMY     Patient Active Problem List   Diagnosis Date Noted   Right lumbar radiculitis 11/10/2021   Type  1 diabetes mellitus with diabetic polyneuropathy ([place]) 08/14/2021   Proliferative retinopathy due to type 1 diabetes mellitus ([place]) 08/14/2021   Personal history of other malignant neoplasm of kidney 08/14/2021   Right distal biceps tendon tear 02/07/2019   Vitreous hemorrhage, left eye ([place]) 12/12/2016   Hypertensive heart disease 10/17/2016   Essential hypertension 62/70/3500   Complications affecting other specified body systems, hypertension 04/17/2013   Mixed hyperlipidemia 04/17/2013   Coronary atherosclerosis of native coronary artery 04/17/2013   Bilateral carotid artery disease ([place]) 04/17/2013   Nonspecific abnormal electrocardiogram (ECG) (EKG) 04/17/2013    PCP: Samuel Bouche, NP  REFERRING PROVIDER: Dr Roma Schanz DIAG: Rt lumbar radiculitis  Rationale for Evaluation and Treatment Rehabilitation  THERAPY DIAG:  Radiculopathy, lumbar region  Other symptoms and signs involving the musculoskeletal system  Muscle weakness (generalized)  ONSET DATE: 10/15/21  SUBJECTIVE:  SUBJECTIVE STATEMENT:  12/08/21: Patient reports that he is improving. He has some pain at night but not as often as he was having pain. He is still concerned with balance and would like exercises to strengthen ankle. He is still bowling and that is going well with no increase in pain. He stretches before and after bowling now. He has been doing exercises at home without difficulty. Wants to continue to work on his balance and strengthening.    Evaluation: Back pain increasing over the past month. He has had intermittent pain in the LB for several years. He is having difficulty sleeping. He has some pain in the Rt buttocks and leg after 2-3 hours of sleep.   PERTINENT HISTORY:  LBP and Rt hip pain since 1991 when  he was playing softball. He has had some intermittent back and hip since time of injury. Hx of cardiac bypass; HTN; AODM;   PAIN:  Are you having pain? Yes: NPRS scale: 0/10 Pain location: Rt buttock  Pain description: dull aching less sharp; no radiating pain through calf to ankle  Aggravating factors: lying down sleeping; sitting > 1 hour Relieving factors: standing; walking; meds    PRECAUTIONS: None  WEIGHT BEARING RESTRICTIONS No  FALLS:  Has patient fallen in last 6 months? 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 X-ray - Mild thoracolumbar dextroscoliosis; Bilateral nephrolithiasis.  PATIENT SURVEYS:  FOTO 50   COGNITION:  Overall cognitive status: Within functional limits for tasks assessed     SENSATION: WFL  MUSCLE LENGTH: Hamstrings: Right 65 deg; Left 70 deg Thomas test: tight Rt > Lt   POSTURE:  head forward; shoulders rounded; slightly flexed forward at hips  PALPATION: Muscular tightness Rt posterior hip lateral sacral border to posterior greater trochanter; piriformis; glut med   LUMBAR ROM:   Active  A/PROM  eval  Flexion 75% pulling Rt HS to posterior knee  Extension 75%  Right lateral flexion 100%  Left lateral flexion 90%  Right rotation 80%  Left rotation 80%   (Blank rows = not tested)  LOWER EXTREMITY ROM:     Tight end ranges Rt hip rotation, flexion; bilat hip ext   LOWER EXTREMITY MMT:    MMT Right eval 12/08/21 Left eval  Hip flexion     Hip extension     Hip abduction 4/5 5/5 5/5  Hip adduction     Hip internal rotation     Hip external rotation     Knee flexion     Knee extension     Ankle dorsiflexion     Ankle  plantarflexion     Ankle inversion     Ankle eversion      (Blank rows = not tested)  LUMBAR SPECIAL TESTS:  Straight leg raise test: Negative and Slump test: Negative  FUNCTIONAL TESTS:  SLS: Rt 2-3 sec; Lt 5-7 sec    GAIT: Distance walked: 40 Assistive device utilized: None Level of assistance: Complete Independence Comments: Gait WNL's    Palpation: 11/24/21 tightness noted Rt > Lt posterior hip through the piriformis and glut musculature      TODAY'S TREATMENT  12/08/21:  Encouraged modifications for sitting in recliner which  Lisandro has not done yet He has made for modifications for sleeping Lt sidelying   Therapeutic exercise: exercises today (.)  [place] L6 x 5 min   Reviewed HEP  Supine:  Piriformis stretch travell varying angles 30 sec x 5   Hamstring stretch 30 sec x 2 reps  ITB stretch 30 sec x 3 reps  .Hip abduction hooklying green TB x 10 ea side .Bridge 3 sec x 10 reps green TB distal thigh c   Prone: Prone press up 2 sec x 10  Glut set 10 sec x 5 reps Hip extension alternating 3 sec x 10 reps   Standing: Hip abduction 2 sec leading with heel green TB x 5   Standing trunk extension 2 sec x 2 reps  .Row blue TB x 10  .Bow and arrow black TB x 10   .Antirotation black TB x 10   .SLS 10 sec x 3   SLS diver x 5   Counter plank 60 sec x2  .Gastroc stretch 30 sec x 2 reps  .Side steps green TB x 10 ft x 5 each direction .Single arm extension black TB x 10    Added    4 way ankle TB exercises          Heel raise x 10   Hamstring adductor stretch   Hamstring/ITB stretch    Seated hamstring stretch      PATIENT EDUCATION:  Education details: POC HEP Person educated: Patient Education method: Explanation, Demonstration, Tactile cues, Verbal cues, and Handouts Education comprehension: verbalized understanding, returned demonstration, verbal cues required, tactile cues required, and needs further education   HOME EXERCISE PROGRAM:  Access Code:  [email or web address] URL: [email or web address] Date: 12/08/2021 Prepared by: Gillermo Murdoch  Exercises - Prone Press Up  - 1 x daily - 7 x weekly - 1 sets - 10 reps - 2-3 sec  hold - Supine Piriformis Stretch with Leg Straight  - 1 x daily - 7 x weekly - 1 sets - 3 reps - 30 sec  hold - Hooklying Hamstring Stretch with Strap  - 1 x daily - 7 x weekly - 1 sets - 3 reps - 30 sec  hold - Supine ITB Stretch with Strap  - 1 x daily - 7 x weekly - 1 sets - 3 reps - 30 sec  hold - Standing Lumbar Extension  - 1 x daily - 7 x weekly - 1 sets - 2-3 reps - 2-3 sec  hold - Standing Hip Abduction with Counter Support  - 1 x daily - 7 x weekly - 2-3 sets - 10 reps - 2-3 sec  hold - Prone Gluteal Sets  - 1 x daily - 7 x weekly - 1 sets - 10 reps - 10 sec  hold - Prone Hip Extension  - 1 x daily - 7 x weekly - 1 sets - 10 reps - 3 sec  hold - Hooklying Isometric Clamshell  - 1 x daily - 7 x weekly - 1 sets - 10 reps - 3 sec  hold - Supine Bridge  - 1 x daily - 7 x weekly - 1-2 sets - 10 reps - 3-5 sec  hold - Standing Bilateral Low Shoulder Row with Anchored Resistance  - 2 x daily - 7 x weekly - 1-3 sets - 10 reps - 2-3 sec  hold - Drawing Bow  - 1 x daily - 7 x weekly - 1 sets - 10 reps - 3 sec  hold - Anti-Rotation Lateral Stepping with Press  - 2 x daily - 7 x weekly - 1-2 sets - 10 reps - 2-3 sec  hold - Single Leg Stance  - 2 x daily - 7 x weekly - 2 sets - 5 reps - 20 sec hold - The Diver  - 2 x daily - 7 x weekly - 1 sets - 10 reps - 2-3 sec  hold - Standing Piriformis Release with Ball at [employer]  - 2 x daily - 7 x weekly - 30-60 sec  hold - Plank on Counter  - 2 x daily - 7 x weekly - 1 sets - 3 reps - 30 sec  hold - Gastroc Stretch on Wall  - 1 x daily - 7 x weekly - 1 sets - 3 reps - 30 sec  hold - Bridge with Hip Abduction and Resistance  - 1 x daily - 7 x weekly - 1-2 sets - 10 reps - 3 sec  hold - Standing Hip Abduction with Resistance at Thighs  - 1 x daily - 7 x weekly - 2-3 sets - 10 reps - 3  sec  hold - Side Stepping with Resistance at Thighs  - 1 x daily - 7 x weekly - Single Arm Shoulder Extension with Anchored Resistance  - 1 x daily - 7 x weekly - 1 sets - 10 reps - 3 sec  hold - Seated Hamstring Stretch  - 2 x daily - 7 x weekly - 1 sets - 3 reps - 30 sec  hold - Hip Adductors and Hamstring Stretch with Strap  - 2 x daily - 7 x weekly - 1 sets - 3 reps - 30 sec  hold - Supine ITB Stretch with Strap  - 2 x daily - 7 x weekly - 1 sets - 3 reps - 30 sec  hold - Ankle Dorsiflexion with Resistance  - 1 x daily - 7 x weekly - 1-3 sets - 10 reps - 3-5 sec  hold - Ankle Eversion with Resistance  - 1 x daily - 7 x weekly - 1-3 sets - 10 reps - 35 sec  hold - Ankle Inversion with Resistance  - 1 x daily - 7 x weekly - 1-3 sets - 10 reps - 3-5 sec  hold - Ankle and Toe Plantarflexion with Resistance  - 1 x daily - 7 x weekly - 1-3 sets - 10 reps - 3-5 sec  hold - Standing Heel Raise with Support  - 1 x daily - 7 x weekly - 1 sets - 10 reps - 2-3 sec  hold - Single Leg Stance on Foam Pad  - 1 x daily - 7 x weekly - 1 sets - 3 reps - 10 sec  hold - Standing 4-Way Leg Reach with Unilateral Counter Support  - 1 x daily - 7 x weekly - 1 sets - 10 reps  ASSESSMENT:  CLINICAL IMPRESSION:  12/08/21: Patient reports continued improvement with no back pain. He is working on his exercises. Reviewed and progressed with therapeutic exercise for ankles and balance. Reviewed all exercises  Patient will continue with HEP and call with any questions or problems.    Evaluation: Patient is a 71 y.o. male who was seen today for physical therapy evaluation and treatment for Rt posterior hip and radicular pain into the Rt posterior thigh to lateral calf. He has limited mobility in trunk and LE; weakness in Rt hip; limited functional activities; pain with  functional activities; pain and difficulty with sleeping. He will benefit from PT to address problems.     OBJECTIVE IMPAIRMENTS decreased activity  tolerance, decreased mobility, decreased ROM, decreased strength, increased fascial restrictions, impaired flexibility, improper body mechanics, postural dysfunction, and pain.   ACTIVITY LIMITATIONS standing and sleeping  PARTICIPATION LIMITATIONS:  prolonged sitting   PERSONAL FACTORS Time since onset of injury/illness/exacerbation and 1-2 comorbidities: HTN; cardiovascular surgery  are also affecting patient's functional outcome.   REHAB POTENTIAL: Good  CLINICAL DECISION MAKING: Stable/uncomplicated  EVALUATION COMPLEXITY: Low   GOALS: Goals reviewed with patient? Yes   LONG TERM GOALS: Target date: 01/19/2022  Decrease radicular Rt LE pain by 75-100%  Baseline:  Goal status: achieved INITIAL  2.  Patient to reports sleeping for 6-8 hours without awakening due to pain  Baseline:  Goal status: achieved INITIAL  3.  5/5 strength Rt hip abductors  Baseline:  Goal status: achieved INITIAL  4.  Independent in HEP (including aquatic program as indicated) Baseline:  Goal status: achieved INITIAL  5.  Improve functional limitation score to 62 Baseline:  Goal status: achieved INITIAL    PLAN: PT FREQUENCY: 2x/week  PT DURATION: 6 weeks  PLANNED INTERVENTIONS: Therapeutic exercises, Therapeutic activity, Neuromuscular re-education, Balance training, Gait training, Patient/Family education, Self Care, Joint mobilization, Aquatic Therapy, Dry Needling, Electrical stimulation, Cryotherapy, Moist heat, Taping, Manual therapy, and Re-evaluation.  PLAN FOR NEXT SESSION: continue with HEP independent - will call with questions or problems   Elsia Lasota Nilda Simmer, PT, MPH 12/08/2021, 11:08 AM

## 2021-12-09 ENCOUNTER — Telehealth: Payer: Self-pay | Admitting: *Deleted

## 2021-12-09 DIAGNOSIS — I6523 Occlusion and stenosis of bilateral carotid arteries: Secondary | ICD-10-CM

## 2021-12-09 NOTE — Telephone Encounter (Signed)
-----   Message from Jettie Booze, MD sent at 12/07/2021  4:50 PM EDT ----- Moderate carotid disease.  Repeat study in 1 year

## 2021-12-09 NOTE — Telephone Encounter (Signed)
Patient notified.  Patient reports he takes lisinopril in the morning. BP will sometimes be around 110/60 prior to taking lisinopril. He will occasionally have light headedness a little later in the morning and BP will sometimes be around 100/55.  This is not daily.  Comes and goes.  BP in the afternoons is 120-130/70-75.  We discussed possibly changing times he takes lisinopril.  It was decided patient will check BP in the morning 2 hours or so after taking lisinopril, when he feels lightheaded and in the afternoon.  He will contact office with these readings. Patient aware to stay hydrated during the day.

## 2021-12-22 ENCOUNTER — Ambulatory Visit (INDEPENDENT_AMBULATORY_CARE_PROVIDER_SITE_OTHER): Payer: Medicare Other | Admitting: Sports Medicine

## 2021-12-22 DIAGNOSIS — Z23 Encounter for immunization: Secondary | ICD-10-CM | POA: Diagnosis not present

## 2021-12-22 DIAGNOSIS — M5416 Radiculopathy, lumbar region: Secondary | ICD-10-CM

## 2021-12-22 DIAGNOSIS — I6523 Occlusion and stenosis of bilateral carotid arteries: Secondary | ICD-10-CM

## 2021-12-22 NOTE — Progress Notes (Signed)
    Procedures performed today:    None.  Independent interpretation of notes and tests performed by another provider:   None.  Brief History, Exam, Impression, and Recommendations:    Right lumbar radiculitis Pleasant 71 year old male, long history of axial low back pain with radiation down the right leg, buttock, right lower leg to the inside of the right ankle. He responded well to 5 days of steroids, formal physical therapy, does have some discomfort but is able to sleep mostly through the night about 4 to 6 hours. He did try meloxicam but stopped it due to intolerance, felt some jitters that I informed him were probably not from the meloxicam but we can hold off of additional medicines. I did offer gabapentin, but I think we are just going to do additional advanced home conditioning for another 6 weeks before considering something like Neurontin, he can return to see me on an as-needed basis.    ____________________________________________ Gwen Her. Dianah Field, M.D., ABFM., CAQSM., AME. Primary Care and Sports Medicine [facility]  Adjunct Professor of [employer] [facility] of Medicine  Risk manager

## 2021-12-22 NOTE — Assessment & Plan Note (Signed)
Pleasant 71 year old male, long history of axial low back pain with radiation down the right leg, buttock, right lower leg to the inside of the right ankle. He responded well to 5 days of steroids, formal physical therapy, does have some discomfort but is able to sleep mostly through the night about 4 to 6 hours. He did try meloxicam but stopped it due to intolerance, felt some jitters that I informed him were probably not from the meloxicam but we can hold off of additional medicines. I did offer gabapentin, but I think we are just going to do additional advanced home conditioning for another 6 weeks before considering something like Neurontin, he can return to see me on an as-needed basis.

## 2022-01-09 ENCOUNTER — Encounter (INDEPENDENT_AMBULATORY_CARE_PROVIDER_SITE_OTHER): Payer: Medicare Other | Admitting: Ophthalmology

## 2022-01-09 DIAGNOSIS — I10 Essential (primary) hypertension: Secondary | ICD-10-CM | POA: Diagnosis not present

## 2022-01-09 DIAGNOSIS — H35033 Hypertensive retinopathy, bilateral: Secondary | ICD-10-CM

## 2022-01-09 DIAGNOSIS — E113513 Type 2 diabetes mellitus with proliferative diabetic retinopathy with macular edema, bilateral: Secondary | ICD-10-CM

## 2022-01-09 DIAGNOSIS — H43811 Vitreous degeneration, right eye: Secondary | ICD-10-CM | POA: Diagnosis not present

## 2022-01-31 ENCOUNTER — Other Ambulatory Visit: Payer: Self-pay | Admitting: Interventional Cardiology

## 2022-02-02 ENCOUNTER — Ambulatory Visit: Payer: No Typology Code available for payment source | Admitting: Sports Medicine

## 2022-02-02 ENCOUNTER — Other Ambulatory Visit: Payer: Self-pay | Admitting: Interventional Cardiology

## 2022-02-14 ENCOUNTER — Other Ambulatory Visit: Payer: Self-pay | Admitting: Interventional Cardiology

## 2022-02-20 ENCOUNTER — Encounter (INDEPENDENT_AMBULATORY_CARE_PROVIDER_SITE_OTHER): Payer: Medicare Other | Admitting: Ophthalmology

## 2022-02-20 DIAGNOSIS — H35033 Hypertensive retinopathy, bilateral: Secondary | ICD-10-CM | POA: Diagnosis not present

## 2022-02-20 DIAGNOSIS — I10 Essential (primary) hypertension: Secondary | ICD-10-CM

## 2022-02-20 DIAGNOSIS — E113513 Type 2 diabetes mellitus with proliferative diabetic retinopathy with macular edema, bilateral: Secondary | ICD-10-CM

## 2022-02-20 DIAGNOSIS — H43811 Vitreous degeneration, right eye: Secondary | ICD-10-CM

## 2022-02-27 ENCOUNTER — Encounter: Payer: Self-pay | Admitting: Sports Medicine

## 2022-02-27 ENCOUNTER — Ambulatory Visit (INDEPENDENT_AMBULATORY_CARE_PROVIDER_SITE_OTHER): Payer: Medicare Other | Admitting: Sports Medicine

## 2022-02-27 VITALS — Wt 167.0 lb

## 2022-02-27 DIAGNOSIS — I6523 Occlusion and stenosis of bilateral carotid arteries: Secondary | ICD-10-CM

## 2022-02-27 DIAGNOSIS — M5416 Radiculopathy, lumbar region: Secondary | ICD-10-CM

## 2022-02-27 MED ORDER — GABAPENTIN 300 MG PO CAPS: ORAL_CAPSULE | ORAL | 3 refills | Status: DC

## 2022-02-27 NOTE — Assessment & Plan Note (Signed)
Jeremy Johnston returns, he is a pleasant 71 year old male, chronic axial low back pain with radiation to the right buttock, it does not go all the way down anymore, he did finish 5 days of steroids, formal physical therapy. Did not respond well and could not tolerate meloxicam. He has been doing aggressive home conditioning and feels significant improvement, he still has some discomfort at night so we will add Neurontin and he can return to see me back in 6 weeks as needed. Next step would be MRI for epidural planning.

## 2022-02-27 NOTE — Progress Notes (Signed)
    Procedures performed today:    None.  Independent interpretation of notes and tests performed by another provider:   None.  Brief History, Exam, Impression, and Recommendations:    Right lumbar radiculitis Jeremy Johnston returns, he is a pleasant 71 year old male, chronic axial low back pain with radiation to the right buttock, it does not go all the way down anymore, he did finish 5 days of steroids, formal physical therapy. Did not respond well and could not tolerate meloxicam. He has been doing aggressive home conditioning and feels significant improvement, he still has some discomfort at night so we will add Neurontin and he can return to see me back in 6 weeks as needed. Next step would be MRI for epidural planning.    ____________________________________________ Gwen Her. Dianah Field, M.D., ABFM., CAQSM., AME. Primary Care and Sports Medicine [facility]  Adjunct Professor of [place] [employer] [facility] of Medicine  Risk manager

## 2022-03-30 ENCOUNTER — Ambulatory Visit (INDEPENDENT_AMBULATORY_CARE_PROVIDER_SITE_OTHER): Payer: Medicare Other | Admitting: Medical-Surgical

## 2022-03-30 ENCOUNTER — Encounter: Payer: Self-pay | Admitting: Medical-Surgical

## 2022-03-30 VITALS — BP 105/59 | HR 72 | Temp 98.0°F | Resp 20 | Ht 67.0 in | Wt 165.1 lb

## 2022-03-30 DIAGNOSIS — M7918 Myalgia, other site: Secondary | ICD-10-CM | POA: Insufficient documentation

## 2022-03-30 DIAGNOSIS — J069 Acute upper respiratory infection, unspecified: Secondary | ICD-10-CM

## 2022-03-30 MED ORDER — IPRATROPIUM BROMIDE 0.03 % NA SOLN: 2.0000 | Freq: Two times a day (BID) | NASAL | 0 refills | Status: DC

## 2022-03-30 NOTE — Progress Notes (Signed)
Established Patient Office Visit  Subjective   Patient ID: Jeremy Johnston, male   DOB: 1950/12/12 Age: 71 y.o. MRN: [id number]   Chief Complaint  Patient presents with   Cough   Nasal Congestion    HPI Pleasant 71 year old male presenting today with reports of about 48 hours of upper respiratory symptoms including worsening rhinorrhea, sinus congestion, and a cough that is productive of small amounts of clear to white sputum.  Notes that he has had a runny nose since he contracted flu a around the first of this year.  This was followed by bronchitis a few weeks later and since then he feels that his nose runs all the time.  When the weather got cold over the past few weeks, the runny nose increased but his other symptoms did not start until Tuesday.  He has had no fever or chills.  No chest pain, shortness of breath, GI upset, nausea, or vomiting.  Uses Claritin as needed and does occasional saline flushes.  Took a COVID test this morning with negative results.   Objective:    Vitals:   03/30/22 1404  BP: (!) 105/59  Pulse: 72  Temp: 98 F (36.7 C)  Resp: 20  Height: '5\' 7"'$  (1.702 m)  Weight: 165 lb 1.3 oz (74.9 kg)  SpO2: 96%  BMI (Calculated): 25.85    Physical Exam Vitals reviewed.  Constitutional:      General: He is not in acute distress.    Appearance: Normal appearance. He is obese. He is not ill-appearing.  HENT:     Head: Normocephalic and atraumatic.     Right Ear: Tympanic membrane, ear canal and external ear normal. There is no impacted cerumen.     Left Ear: Tympanic membrane, ear canal and external ear normal. There is no impacted cerumen.     Nose: Rhinorrhea present. No congestion.     Mouth/Throat:     Mouth: Mucous membranes are moist.     Pharynx: Oropharynx is clear. No oropharyngeal exudate.  Eyes:     General: No scleral icterus.       Right eye: No discharge.        Left eye: No discharge.     Extraocular Movements: Extraocular movements intact.      Conjunctiva/sclera: Conjunctivae normal.     Pupils: Pupils are equal, round, and reactive to light.  Cardiovascular:     Rate and Rhythm: Normal rate and regular rhythm.     Pulses: Normal pulses.     Heart sounds: Normal heart sounds. No murmur heard.    No friction rub. No gallop.  Pulmonary:     Effort: Pulmonary effort is normal. No respiratory distress.     Breath sounds: Normal breath sounds.  Skin:    General: Skin is warm and dry.  Neurological:     Mental Status: He is alert and oriented to person, place, and time.  Psychiatric:        Mood and Affect: Mood normal.        Behavior: Behavior normal.        Thought Content: Thought content normal.        Judgment: Judgment normal.   No results found for this or any previous visit (from the past 24 hour(s)).     The ASCVD Risk score (Arnett DK, et al., 2019) failed to calculate for the following reasons:   The valid total cholesterol range is 130 to 320 mg/dL   Assessment & Plan:  1. Viral URI Symptoms are consistent with a viral URI.  Negative for flu and COVID.  Suspect RSV, rhinovirus, etc.  Recommend conservative measures and over-the-counter treatments for symptom management.  Okay to use Claritin and saline rinses.  For chronic rhinorrhea, sending in Atrovent nasal spray twice daily as needed.  Offered to send in a short course of Claritin-D to help with sinus congestion but patient declined.  Return if symptoms worsen or fail to improve.  ___________________________________________ Clearnce Sorrel, DNP, APRN, FNP-BC Primary Care and [place]

## 2022-04-05 ENCOUNTER — Encounter (INDEPENDENT_AMBULATORY_CARE_PROVIDER_SITE_OTHER): Payer: Medicare Other | Admitting: Ophthalmology

## 2022-04-05 DIAGNOSIS — H35033 Hypertensive retinopathy, bilateral: Secondary | ICD-10-CM

## 2022-04-05 DIAGNOSIS — I10 Essential (primary) hypertension: Secondary | ICD-10-CM | POA: Diagnosis not present

## 2022-04-05 DIAGNOSIS — E113513 Type 2 diabetes mellitus with proliferative diabetic retinopathy with macular edema, bilateral: Secondary | ICD-10-CM | POA: Diagnosis not present

## 2022-04-05 DIAGNOSIS — H43811 Vitreous degeneration, right eye: Secondary | ICD-10-CM | POA: Diagnosis not present

## 2022-04-10 ENCOUNTER — Ambulatory Visit: Payer: No Typology Code available for payment source | Admitting: Medical-Surgical

## 2022-04-10 ENCOUNTER — Ambulatory Visit (INDEPENDENT_AMBULATORY_CARE_PROVIDER_SITE_OTHER): Payer: Medicare Other | Admitting: Sports Medicine

## 2022-04-10 DIAGNOSIS — M5416 Radiculopathy, lumbar region: Secondary | ICD-10-CM | POA: Diagnosis not present

## 2022-04-10 NOTE — Assessment & Plan Note (Signed)
Jeremy Johnston is a very pleasant 72 year old male with chronic axial low back pain, radiation to the right buttock, he did okay with formal therapy, steroids but did not tolerate meloxicam, we added gabapentin at the last visit, he has done well with 600 mg nightly, he is happy with how things are going so he can return as needed and we can continue gabapentin long-term.

## 2022-04-10 NOTE — Progress Notes (Signed)
    Procedures performed today:    None.  Independent interpretation of notes and tests performed by another provider:   None.  Brief History, Exam, Impression, and Recommendations:    Right lumbar radiculitis Jeremy Johnston is a very pleasant 72 year old male with chronic axial low back pain, radiation to the right buttock, he did okay with formal therapy, steroids but did not tolerate meloxicam, we added gabapentin at the last visit, he has done well with 600 mg nightly, he is happy with how things are going so he can return as needed and we can continue gabapentin long-term.    ____________________________________________ Jeremy Her. Dianah Field, M.D., ABFM., CAQSM., AME. Primary Care and Sports Medicine [facility]  Adjunct Professor of [place] [employer] [facility] of Medicine  Risk manager

## 2022-04-13 NOTE — Progress Notes (Signed)
Cardiology Office Note   Date:  04/14/2022   ID:  Jeremy Johnston, DOB Jan 05, 1951, MRN [id number]  PCP:  Samuel Bouche, NP    No chief complaint on file.  CAD  Wt Readings from Last 3 Encounters:  04/14/22 160 lb 6.4 oz (72.8 kg)  03/30/22 165 lb 1.3 oz (74.9 kg)  02/27/22 167 lb (75.8 kg)       History of Present Illness: Jeremy Johnston is a 72 y.o. male  who has had DM and CAD.  DM diagnosed in 1986. He had CABG in 2008.  He was essentially asypmtomatic at the time of his CABG. Change on routine ECG in 2008 prompted stress testing and w/u that led to CABG.     He had a stress test in 2011 with a small inferior defect.  He had a ETT in 2014 which was unremarkable per his report.  He is alternating nuclear tests and ETT on an annual basis.      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  He works for [employer]. He had an ETT (9 min on treadmill) in 6/16 which was low risk.  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   Normal ETT in 8/17.    2020 stress test showed: "Nuclear stress EF: 60%. Blood pressure demonstrated a normal response to exercise. There was no ST segment deviation noted during stress. Defect 1: There is a large defect of moderate severity present in the basal inferior, basal inferolateral, mid inferior, mid inferolateral, apical inferior and apical lateral location. Findings consistent with prior myocardial infarction inferiorly with peri-infarct ischemia in the mid to apical inferolateral region. This is a low risk study. The left ventricular ejection fraction is normal (55-65%)."   Tore biceps tendon in 2020 while bowling.  He still does these exercises.    In 2021: "He had some trouble swallowing.  GERD sx as well, which improve with standing or shifting to a vertical position.  Bowls 4 days/week. He walks on the treadmill other days.  He will go at 2.5-3 mph for 40 minutes.  BP at home tends to be lower.  Better with  lower dose quinapril."   Normal ETT in 01/2021.    In 2022: " At home, low in the mornings.  Systolic around 784. He has lightheadedness with that reading.  Move accupril to midday since readings are higher in the afternoon, up to 140s. "  Gabapentin added for sciatica pain.  THis has helped sleep.   Since the last visit, he stretches, walks and bowls for exercise.   Denies : Chest pain. Dizziness. Leg edema. Nitroglycerin use. Orthopnea. Palpitations. Paroxysmal nocturnal dyspnea. Shortness of breath. Syncope.      Past Medical History:  Diagnosis Date   Arthritis    Cancer ([place])    kidney cancer-both kidneys remain   Chronic kidney disease    Robotic Kidney surgery for kidney cancer tumor-kidney remains-   Coronary artery disease    Diabetes mellitus ([place])    Insulin pump -[name], and has a glucose transmitter attached.   Diabetic retinopathy ([place])    left eye   Hyperlipidemia    Hypertension    Sciatica    Vitreous hemorrhage ([place])    left eye    Past Surgical History:  Procedure Laterality Date   CATARACT EXTRACTION, BILATERAL Bilateral    11'15   CORONARY ARTERY BYPASS GRAFT  01/2007  x4 vessels([place])   DIAGNOSTIC LAPAROSCOPY     EYE SURGERY     retina peele   HERNIA REPAIR     KIDNEY SURGERY     tumor removed   PARS PLANA VITRECTOMY 27 GAUGE Left 12/12/2016   Procedure: PARS PLANA VITRECTOMY 55 GAUGE , ENDOLASER GAS/ FLUID EXCHAGNE;  Surgeon: Hayden Pedro, MD;  Location: [facility];  Service: Ophthalmology;  Laterality: Left;   VASECTOMY       Current Outpatient Medications  Medication Sig Dispense Refill   Aflibercept (EYLEA IO) Inject into the eye.     aspirin 325 MG tablet Take 325 mg by mouth as needed for mild pain (REPORTS TAKING 2-3 TABLETS FOR SLEEP.).     brimonidine (ALPHAGAN) 0.15 % ophthalmic solution Place 1 drop into both eyes 2 (two) times daily.  11   ciprofloxacin (CILOXAN) 0.3 % ophthalmic solution Place 1 drop into both eyes 4 (four)  times daily as needed.     clopidogrel (PLAVIX) 75 MG tablet TAKE 1 TABLET DAILY WITH   BREAKFAST 90 tablet 0   dorzolamide-timolol (COSOPT) 22.3-6.8 MG/ML ophthalmic solution Place 1 drop into the right eye 2 (two) times daily.     ezetimibe (ZETIA) 10 MG tablet Take 1 tablet (10 mg total) by mouth daily. Please keep scheduled appointment for future refills. Thank you. 90 tablet 0   famotidine (PEPCID) 20 MG tablet Take 20 mg by mouth daily as needed for heartburn or indigestion.     gabapentin (NEURONTIN) 300 MG capsule Take 2 capsules (600 mg total) by mouth at bedtime.     Insulin Human (INSULIN PUMP) 100 unit/ml SOLN Inject into the skin. Use with insulin as directed     ipratropium (ATROVENT) 0.03 % nasal spray Place 2 sprays into both nostrils every 12 (twelve) hours. 30 mL 0   lisinopril (ZESTRIL) 5 MG tablet TAKE 1 TABLET DAILY, STOP  QUINAPRIL(CHANGE MADE DUE  TO QUINAPRIL BEING ON      BACKORDER) 90 tablet 0   loratadine (CLARITIN) 10 MG tablet Take 10 mg by mouth daily as needed for allergies.     Multiple Vitamin (MULTIVITAMIN WITH MINERALS) TABS tablet Take 1 tablet by mouth 2 (two) times a week.      NOVOLOG 100 UNIT/ML injection Inject 1 Units into the skin continuous. Basal rate 1 unit per hour, may vary.  Bolus 1 unit per 10g of carbs     rosuvastatin (CRESTOR) 40 MG tablet TAKE 1 TABLET AT BEDTIME 90 tablet 0   trimethoprim-polymyxin b (POLYTRIM) ophthalmic solution      No current facility-administered medications for this visit.    Allergies:   Insulin lispro    Social History:  The patient  reports that he has never smoked. He has never used smokeless tobacco. He reports that he does not drink alcohol and does not use drugs.   Family History:  The patient's family history includes Diabetes in his sister; Healthy in his brother, half-brother, half-brother, half-brother, half-sister, and half-sister; Heart attack in his father and sister.    ROS:  Please see the history  of present illness.   Otherwise, review of systems are positive for sciatica pain.   All other systems are reviewed and negative.    PHYSICAL EXAM: VS:  BP 132/68   Pulse 78   Ht '5\' 7"'$  (1.702 m)   Wt 160 lb 6.4 oz (72.8 kg)   SpO2 93%   BMI 25.12 kg/m  , BMI Body mass  index is 25.12 kg/m. GEN: Well nourished, well developed, in no acute distress HEENT: normal Neck: no JVD, carotid bruits, or masses Cardiac: RRR; no murmurs, rubs, or gallops,no edema  Respiratory:  clear to auscultation bilaterally, normal work of breathing GI: soft, nontender, nondistended, + BS MS: no deformity or atrophy Skin: warm and dry, no rash Neuro:  Strength and sensation are intact Psych: euthymic mood, full affect   EKG:   The ekg ordered today demonstrates NSR, inferior Q waves, nonspecific t wave changes   Recent Labs: 11/23/2021: ALT 20; BUN 27; Creatinine 0.9; Potassium 4.6; Sodium 140; TSH 1.27   Lipid Panel    Component Value Date/Time   CHOL 124 11/23/2021 0000   CHOL 127 10/25/2017 0842   TRIG 48 11/23/2021 0000   HDL 39 11/23/2021 0000   HDL 40 10/25/2017 0842   CHOLHDL 3.2 10/25/2017 0842   LDLCALC 74 11/23/2021 0000   LDLCALC 75 10/25/2017 0842     Other studies Reviewed: Additional studies/ records that were reviewed today with results demonstrating: labs reviewed.   ASSESSMENT AND PLAN:  CAD: Negative ETT in 2022.  No FAA requirements since he gave up license since he is on insulin. Restrictions with insulin use were too rigorous.  He will let us know if he has any change in exercise tolerance or anginal symptoms.  Currently, he is staying active without any difficulty. Diabetes:  Whole food, plant based diet.  A1C 6.8 in 12/23.  Uses continuous glucose monitoring. Hyperlipidemia: LDL 74. Continue rosuvastatin and Zetia. LDL target now 55 given DM and CABG. Will check with PharmD about other options.  Hypertensive heart disease: The current medical regimen is effective;   continue present plan and medications. Carotid artery disease: Moderate disease noted in 9/23.  Repeat scan in September 2024.   Current medicines are reviewed at length with the patient today.  The patient concerns regarding his medicines were addressed.  The following changes have been made:  No change  Labs/ tests ordered today include:  No orders of the defined types were placed in this encounter.   Recommend 150 minutes/week of aerobic exercise Low fat, low carb, high fiber diet recommended  Disposition:   FU in 1 year   Signed, Larae Grooms, MD  04/14/2022 9:38 AM    [facility] [address], [place], [place]  [address] Phone: [phone]; Fax: [phone]

## 2022-04-14 ENCOUNTER — Ambulatory Visit: Payer: Medicare Other | Attending: Interventional Cardiology | Admitting: Interventional Cardiology

## 2022-04-14 ENCOUNTER — Telehealth: Payer: Self-pay | Admitting: *Deleted

## 2022-04-14 ENCOUNTER — Encounter: Payer: Self-pay | Admitting: Interventional Cardiology

## 2022-04-14 VITALS — BP 132/68 | HR 78 | Ht 67.0 in | Wt 160.4 lb

## 2022-04-14 DIAGNOSIS — I6523 Occlusion and stenosis of bilateral carotid arteries: Secondary | ICD-10-CM | POA: Diagnosis not present

## 2022-04-14 DIAGNOSIS — E782 Mixed hyperlipidemia: Secondary | ICD-10-CM

## 2022-04-14 DIAGNOSIS — I25118 Atherosclerotic heart disease of native coronary artery with other forms of angina pectoris: Secondary | ICD-10-CM | POA: Insufficient documentation

## 2022-04-14 DIAGNOSIS — E1159 Type 2 diabetes mellitus with other circulatory complications: Secondary | ICD-10-CM | POA: Diagnosis present

## 2022-04-14 DIAGNOSIS — I10 Essential (primary) hypertension: Secondary | ICD-10-CM | POA: Diagnosis present

## 2022-04-14 NOTE — Telephone Encounter (Signed)
Patient notified. He would like appointment.  Appointment made in lipid clinic for 2/22

## 2022-04-14 NOTE — Patient Instructions (Signed)
Medication Instructions:  Your physician recommends that you continue on your current medications as directed. Please refer to the Current Medication list given to you today.  *If you need a refill on your cardiac medications before your next appointment, please call your pharmacy*   Lab Work: none If you have labs (blood work) drawn today and your tests are completely normal, you will receive your results only by: [place] (if you have MyChart) OR A paper copy in the mail If you have any lab test that is abnormal or we need to change your treatment, we will call you to review the results.   Testing/Procedures: Your physician has requested that you have a carotid duplex. This test is an ultrasound of the carotid arteries in your neck. It looks at blood flow through these arteries that supply the brain with blood. Allow one hour for this exam. There are no restrictions or special instructions.  To be done in September 2024   Follow-Up: At [facility], you and your health needs are our priority.  As part of our continuing mission to provide you with exceptional heart care, we have created designated Provider Care Teams.  These Care Teams include your primary Cardiologist (physician) and Advanced Practice Providers (APPs -  Physician Assistants and Nurse Practitioners) who all work together to provide you with the care you need, when you need it.  We recommend signing up for the patient portal called "MyChart".  Sign up information is provided on this After Visit Summary.  MyChart is used to connect with patients for Virtual Visits (Telemedicine).  Patients are able to view lab/test results, encounter notes, upcoming appointments, etc.  Non-urgent messages can be sent to your provider as well.   To learn more about what you can do with MyChart, go to [email or web address].    Your next appointment:   12 month(s)  Provider:   Larae Grooms, MD     Other  Instructions

## 2022-04-14 NOTE — Telephone Encounter (Signed)
-----  Message from Leeroy Bock, [place] sent at 04/14/2022 10:47 AM EST ----- Yes he should qualify for either Nexlizet or PCSK9i. Fraser Din, are you able to see if pt is willing to come in for PharmD lipid visit to discuss additional med options?  Thanks, Jinny Blossom ----- Message ----- From: Jettie Booze, MD Sent: 04/14/2022  10:46 AM EST To: Harlon Flor Supple, RPH-CPP  LDL 74, h/o CABG, DM.  On Crestor and Zetia.  Would he qualify for advanced therapy?

## 2022-04-14 NOTE — Telephone Encounter (Signed)
Left message to call office

## 2022-04-29 ENCOUNTER — Other Ambulatory Visit: Payer: Self-pay | Admitting: Interventional Cardiology

## 2022-05-15 ENCOUNTER — Encounter (INDEPENDENT_AMBULATORY_CARE_PROVIDER_SITE_OTHER): Payer: Medicare Other | Admitting: Ophthalmology

## 2022-05-15 DIAGNOSIS — E113513 Type 2 diabetes mellitus with proliferative diabetic retinopathy with macular edema, bilateral: Secondary | ICD-10-CM

## 2022-05-15 DIAGNOSIS — H35033 Hypertensive retinopathy, bilateral: Secondary | ICD-10-CM | POA: Diagnosis not present

## 2022-05-15 DIAGNOSIS — I10 Essential (primary) hypertension: Secondary | ICD-10-CM

## 2022-05-15 DIAGNOSIS — H43811 Vitreous degeneration, right eye: Secondary | ICD-10-CM | POA: Diagnosis not present

## 2022-05-25 ENCOUNTER — Ambulatory Visit: Payer: Medicare Other | Attending: Cardiology | Admitting: Pharmacist

## 2022-05-25 DIAGNOSIS — E782 Mixed hyperlipidemia: Secondary | ICD-10-CM

## 2022-05-25 DIAGNOSIS — I6523 Occlusion and stenosis of bilateral carotid arteries: Secondary | ICD-10-CM

## 2022-05-25 MED ORDER — LISINOPRIL 5 MG PO TABS: ORAL_TABLET | ORAL | 3 refills | Status: DC

## 2022-05-25 MED ORDER — CLOPIDOGREL BISULFATE 75 MG PO TABS: 75.0000 mg | ORAL_TABLET | Freq: Every day | ORAL | 3 refills | Status: DC

## 2022-05-25 MED ORDER — ROSUVASTATIN CALCIUM 40 MG PO TABS: 40.0000 mg | ORAL_TABLET | Freq: Every day | ORAL | 3 refills | Status: DC

## 2022-05-25 NOTE — Patient Instructions (Signed)
We will submit information for [name] Will reach out to you when we hear back Jeremy Johnston out your prescription of ezetimibe then you can stop Continue rosuvastatin 52m daily  Please call me at [phone] any questions

## 2022-05-25 NOTE — Assessment & Plan Note (Signed)
Assessment: LDL-C is above goal of <55 due to CAD and DM Reviewed options for lowering LDL cholesterol, including,  PCSK-9 inhibitors, bempedoic acid and inclisiran.  Discussed mechanisms of action, dosing, side effects and potential decreases in LDL cholesterol.  Also reviewed cost information and potential options for patient assistance.  Pt is aware there is no current ASCVD data for [name] as the trial is not complete  Plan: Will do benefits investigation for [name]- should be $0, minus pt medicare deductible Will reach out to patient once I hear back. [name] start form signed Will refer to [facility] Labs after 2nd injection Pt can stop ezetimibe Continue rosuvastatin 17m daily

## 2022-05-25 NOTE — Progress Notes (Signed)
Patient ID: Jeremy Johnston                 DOB: 1950/06/29                    MRN: [id number]      HPI: Jeremy Johnston is a 72 y.o. male patient referred to lipid clinic by Dr. Irish Lack. PMH is significant for DM, CAD s/p CABG in 2008, HTN and HLD. Last LDL-C was 75 on rosuvastatin and ezetimibe. Patient referred to lipid clinic to discuss additional therapies.  Patient presents today to clinic. He is tolerating rosuvastatin and ezetimibe just fine. Does mention that his cost of ezetimibe went up. He is fairly active, bowls several times a week and walks on treadmill. He is a type 1 diabetic on an insulin pump. Has medicare A+B, Part G supplement and a part D supplement.   Reviewed options for lowering LDL cholesterol, including,  PCSK-9 inhibitors, bempedoic acid and inclisiran.  Discussed mechanisms of action, dosing, side effects and potential decreases in LDL cholesterol.  Also reviewed cost information and potential options for patient assistance.   Current Medications: rosuvastatin 62m daily, ezetimibe 115mdaily Risk Factors: CABG, DM, HTN LDL-C goal: <55 ApoB goal: <70  Diet: drinks flavored water  Exercise: bowling, walks on treadmill a few times a week 2.5 mph at a 3 incline, stretching   Family History: The patient's family history includes Diabetes in his sister; Healthy in his brother, half-brother, half-brother, half-brother, half-sister, and half-sister; Heart attack in his father and sister.    Social History: no tobacco, no alcohol  Labs: Lipid Panel     Component Value Date/Time   CHOL 124 11/23/2021 0000   CHOL 127 10/25/2017 0842   TRIG 48 11/23/2021 0000   HDL 39 11/23/2021 0000   HDL 40 10/25/2017 0842   CHOLHDL 3.2 10/25/2017 0842   LDLCALC 74 11/23/2021 0000   LDLCALC 75 10/25/2017 0842   LABVLDL 12 10/25/2017 0842    Past Medical History:  Diagnosis Date   Arthritis    Cancer ([facility]   kidney cancer-both kidneys remain   Chronic kidney disease     Robotic Kidney surgery for kidney cancer tumor-kidney remains-   Coronary artery disease    Diabetes mellitus ([facility]   Insulin pump -[name], and has a glucose transmitter attached.   Diabetic retinopathy ([facility]   left eye   Hyperlipidemia    Hypertension    Sciatica    Vitreous hemorrhage ([facility])    left eye    Current Outpatient Medications on File Prior to Visit  Medication Sig Dispense Refill   Aflibercept (EYLEA IO) Inject into the eye.     aspirin 325 MG tablet Take 325 mg by mouth as needed for mild pain (REPORTS TAKING 2-3 TABLETS FOR SLEEP.).     brimonidine (ALPHAGAN) 0.15 % ophthalmic solution Place 1 drop into both eyes 2 (two) times daily.  11   ciprofloxacin (CILOXAN) 0.3 % ophthalmic solution Place 1 drop into both eyes 4 (four) times daily as needed.     dorzolamide-timolol (COSOPT) 22.3-6.8 MG/ML ophthalmic solution Place 1 drop into the right eye 2 (two) times daily.     famotidine (PEPCID) 20 MG tablet Take 20 mg by mouth daily as needed for heartburn or indigestion.     gabapentin (NEURONTIN) 300 MG capsule Take 2 capsules (600 mg total) by mouth at bedtime.     Insulin Aspart, w/Niacinamide, (FIASP) 100 UNIT/ML SOLN Inject  as directed. Basal rate 1unit/hr in pump     Insulin Human (INSULIN PUMP) 100 unit/ml SOLN Inject into the skin. Use with insulin as directed     ipratropium (ATROVENT) 0.03 % nasal spray Place 2 sprays into both nostrils every 12 (twelve) hours. 30 mL 0   loratadine (CLARITIN) 10 MG tablet Take 10 mg by mouth daily as needed for allergies.     Multiple Vitamin (MULTIVITAMIN WITH MINERALS) TABS tablet Take 1 tablet by mouth 2 (two) times a week.      No current facility-administered medications on file prior to visit.    Allergies  Allergen Reactions   Insulin Lispro Other (See Comments)    Assessment/Plan:  1. Hyperlipidemia -  Mixed hyperlipidemia Assessment: LDL-C is above goal of <55 due to CAD and DM Reviewed options for lowering LDL  cholesterol, including,  PCSK-9 inhibitors, bempedoic acid and inclisiran.  Discussed mechanisms of action, dosing, side effects and potential decreases in LDL cholesterol.  Also reviewed cost information and potential options for patient assistance.  Pt is aware there is no current ASCVD data for [name] as the trial is not complete  Plan: Will do benefits investigation for [name]- should be $0, minus pt medicare deductible Will reach out to patient once I hear back. [name] start form signed Will refer to [facility] infusion center Labs after 2nd injection Pt can stop ezetimibe Continue rosuvastatin 79m daily   Thank you,  Saramarie Stinger D Marquel Pottenger, Pharm.D, BCPS, CPP [facility] [place] [address] Phone: [phone] Fax: [phone]

## 2022-06-06 ENCOUNTER — Encounter: Payer: Self-pay | Admitting: Pharmacist

## 2022-06-06 ENCOUNTER — Other Ambulatory Visit: Payer: Self-pay | Admitting: Pharmacist

## 2022-06-07 ENCOUNTER — Ambulatory Visit: Payer: Medicare Other | Admitting: Podiatry

## 2022-06-09 ENCOUNTER — Telehealth: Payer: Self-pay | Admitting: Pharmacy Technician

## 2022-06-09 NOTE — Telephone Encounter (Signed)
Lowella Curb note:  Auth Submission: NO AUTH NEEDED Payer: medicare a/b & supp Medication & CPT/J Code(s) submitted: [name] (Inclisiran) J1306 Route of submission (phone, fax, portal):  Phone # Fax # Auth type: Buy/Bill Units/visits requested: 3 Reference number:  Approval from: 06/09/22 to 06/09/23   Medicare will cover 80% and Supp insurance will pick-up remaining 20%.  Patient will be scheduled as soon as possible

## 2022-06-15 ENCOUNTER — Ambulatory Visit (INDEPENDENT_AMBULATORY_CARE_PROVIDER_SITE_OTHER): Payer: Medicare Other

## 2022-06-15 VITALS — BP 136/73 | HR 54 | Temp 98.1°F | Resp 18 | Ht 67.0 in | Wt 161.4 lb

## 2022-06-15 DIAGNOSIS — E782 Mixed hyperlipidemia: Secondary | ICD-10-CM

## 2022-06-15 DIAGNOSIS — I25119 Atherosclerotic heart disease of native coronary artery with unspecified angina pectoris: Secondary | ICD-10-CM | POA: Diagnosis not present

## 2022-06-15 MED ORDER — INCLISIRAN SODIUM 284 MG/1.5ML ~~LOC~~ SOSY
284.0000 mg | PREFILLED_SYRINGE | Freq: Once | SUBCUTANEOUS | Status: AC
  Administered 2022-06-15: 284 mg via SUBCUTANEOUS
  Filled 2022-06-15: qty 1.5

## 2022-06-15 NOTE — Progress Notes (Signed)
Diagnosis: Hyperlipidemia  Provider:  Wende Neighbors MD  Procedure: Injection  [name] (inclisiran), Dose: 284 mg, Site: subcutaneous, Number of injections: 1  Post Care: Observation period completed  Discharge: Condition: Good, Destination: Home . AVS Declined  Performed by:  Cleophus Molt, RN

## 2022-06-15 NOTE — Patient Instructions (Signed)
Inclisiran Injection What is this medication? INCLISIRAN (in kli SIR an) treats high cholesterol. It works by decreasing bad cholesterol (such as LDL) in your blood. Changes to diet and exercise are often combined with this medication. This medicine may be used for other purposes; ask your health care provider or pharmacist if you have questions. COMMON BRAND NAME(S): [name] What should I tell my care team before I take this medication? They need to know if you have any of these conditions: An unusual or allergic reaction to inclisiran, other medications, foods, dyes, or preservatives Pregnant or trying to get pregnant Breast-feeding How should I use this medication? This medication is injected under the skin. It is given by your care team in a hospital or clinic setting. Talk to your care team about the use of this medication in children. Special care may be needed. Overdosage: If you think you have taken too much of this medicine contact a poison control center or emergency room at once. NOTE: This medicine is only for you. Do not share this medicine with others. What if I miss a dose? Keep appointments for follow-up doses. It is important not to miss your dose. Call your care team if you are unable to keep an appointment. What may interact with this medication? Interactions are not expected. This list may not describe all possible interactions. Give your health care provider a list of all the medicines, herbs, non-prescription drugs, or dietary supplements you use. Also tell them if you smoke, drink alcohol, or use illegal drugs. Some items may interact with your medicine. What should I watch for while using this medication? Visit your care team for regular checks on your progress. Tell your care team if your symptoms do not start to get better or if they get worse. You may need blood work while you are taking this medication. What side effects may I notice from receiving this  medication? Side effects that you should report to your care team as soon as possible: Allergic reactions--skin rash, itching, hives, swelling of the face, lips, tongue, or throat Side effects that usually do not require medical attention (report these to your care team if they continue or are bothersome): Joint pain Pain, redness, or irritation at injection site This list may not describe all possible side effects. Call your doctor for medical advice about side effects. You may report side effects to FDA at 1-800-FDA-1088. Where should I keep my medication? This medication is given in a hospital or clinic. It will not be stored at home. NOTE: This sheet is a summary. It may not cover all possible information. If you have questions about this medicine, talk to your doctor, pharmacist, or health care provider.  2023 Elsevier/Gold Standard (2020-04-07 00:00:00)  

## 2022-06-16 ENCOUNTER — Encounter: Payer: Self-pay | Admitting: Podiatry

## 2022-06-16 ENCOUNTER — Ambulatory Visit (INDEPENDENT_AMBULATORY_CARE_PROVIDER_SITE_OTHER): Payer: Medicare Other | Admitting: Podiatry

## 2022-06-16 DIAGNOSIS — M79675 Pain in left toe(s): Secondary | ICD-10-CM | POA: Diagnosis not present

## 2022-06-16 DIAGNOSIS — M79674 Pain in right toe(s): Secondary | ICD-10-CM | POA: Diagnosis not present

## 2022-06-16 DIAGNOSIS — E1042 Type 1 diabetes mellitus with diabetic polyneuropathy: Secondary | ICD-10-CM

## 2022-06-16 DIAGNOSIS — B351 Tinea unguium: Secondary | ICD-10-CM

## 2022-06-16 NOTE — Progress Notes (Signed)
  Subjective:  Patient ID: Jeremy Johnston, male    DOB: 08-Aug-1950,   MRN: [id number]  Chief Complaint  Patient presents with   Nail Problem    Left great toenail    72 y.o. male presents for concern of thickened deformed left great toenail. Relates years ago he dropped a can on the toe and has grown in like this since. Relates only painful when steps on it a certain way or bangs it. He has had it trimmed in the past by Dr. Paulla Dolly before but did not remove as concern for blood flow. He is diabetic and last A1c was 7.2  . Denies any other pedal complaints. Denies n/v/f/c.   Past Medical History:  Diagnosis Date   Arthritis    Cancer ([place])    kidney cancer-both kidneys remain   Chronic kidney disease    Robotic Kidney surgery for kidney cancer tumor-kidney remains-   Coronary artery disease    Diabetes mellitus ([place])    Insulin pump -[name], and has a glucose transmitter attached.   Diabetic retinopathy ([place])    left eye   Hyperlipidemia    Hypertension    Sciatica    Vitreous hemorrhage ([facility])    left eye    Objective:  Physical Exam: Vascular: DP/PT pulses 1/4 bilateral. CFT <3 seconds. Normal hair growth on digits. No edema.  Skin. No lacerations or abrasions bilateral feet. Hallux nail on left is thickened and dystrophic with subungual debris. Other nails normal in appearence but elongated.  Musculoskeletal: MMT 5/5 bilateral lower extremities in DF, PF, Inversion and Eversion. Deceased ROM in DF of ankle joint.  Neurological: Sensation intact to light touch.   Assessment:   1. Pain due to onychomycosis of toenails of both feet   2. Type 1 diabetes mellitus with diabetic polyneuropathy ([place])      Plan:  Patient was evaluated and treated and all questions answered. -Discussed and educated patient on diabetic foot care, especially with  regards to the vascular, neurological and musculoskeletal systems.  -Stressed the importance of good glycemic control and the detriment  of not  controlling glucose levels in relation to the foot. -Discussed supportive shoes at all times and checking feet regularly.  -Mechanically debrided all nails 1-5 bilateral using sterile nail nipper and filed with dremel without incident  -Discussed removal (and would need ABIs prior) vs routine care and will continue with routine care.  -Answered all patient questions -Patient to return  in 4 months for at risk foot care -Patient advised to call the office if any problems or questions arise in the meantime.   Lorenda Peck, DPM

## 2022-06-26 ENCOUNTER — Encounter (INDEPENDENT_AMBULATORY_CARE_PROVIDER_SITE_OTHER): Payer: Medicare Other | Admitting: Ophthalmology

## 2022-06-26 DIAGNOSIS — E113313 Type 2 diabetes mellitus with moderate nonproliferative diabetic retinopathy with macular edema, bilateral: Secondary | ICD-10-CM | POA: Diagnosis not present

## 2022-06-26 DIAGNOSIS — H35033 Hypertensive retinopathy, bilateral: Secondary | ICD-10-CM | POA: Diagnosis not present

## 2022-06-26 DIAGNOSIS — I10 Essential (primary) hypertension: Secondary | ICD-10-CM | POA: Diagnosis not present

## 2022-06-26 DIAGNOSIS — H43813 Vitreous degeneration, bilateral: Secondary | ICD-10-CM

## 2022-06-28 ENCOUNTER — Telehealth: Payer: Self-pay | Admitting: Pharmacist

## 2022-06-28 NOTE — Telephone Encounter (Signed)
Patient called stating that since he has gotten his [name] injection his blood sugar has not been as well-regulated.  He states that he has changed Alka-Seltzer 3 times thinking there was an issue with his sensor.  His blood sugar has been previously very well-controlled.  Now when he eats the same meal his blood sugar likes with the same correction that he has been previously using.  Wanted to know if I had seen any literature on this.  Advised patient that I have not seen this as a listed side effect.  He had found something online that he thought was from the [employer], but upon review it seems to be from a on reputable source.  I did find 1 [employer] trial where there was some change in A1c but does not appear to be significant and it was similar to placebo.  I will reach out to the [facility].  Patient got an A1c right before his first [name] injection and is due for a new A1c right before his next [name] injection.  Patient states that if he has to change his correction for his blood sugar he will.  I will let patient know if I find out anything more about [name] effect on blood sugar.

## 2022-07-03 ENCOUNTER — Encounter (INDEPENDENT_AMBULATORY_CARE_PROVIDER_SITE_OTHER): Payer: Medicare Other | Admitting: Sports Medicine

## 2022-07-03 DIAGNOSIS — M5416 Radiculopathy, lumbar region: Secondary | ICD-10-CM

## 2022-07-03 MED ORDER — GABAPENTIN 800 MG PO TABS: 800.0000 mg | ORAL_TABLET | Freq: Every day | ORAL | 3 refills | Status: DC

## 2022-07-03 NOTE — Telephone Encounter (Signed)
I spent 5 total minutes of online digital evaluation and management services in this patient-initiated request for online care. 

## 2022-07-03 NOTE — Addendum Note (Signed)
Addended by: Narda Rutherford on: 07/03/2022 01:12 PM   Modules accepted: Orders

## 2022-07-14 ENCOUNTER — Encounter: Payer: Self-pay | Admitting: Pharmacist

## 2022-07-14 NOTE — Telephone Encounter (Signed)
Info received back from [employer] and sent to patient. No increased issues with blood sugar seen in [facility] trials

## 2022-08-07 ENCOUNTER — Encounter (INDEPENDENT_AMBULATORY_CARE_PROVIDER_SITE_OTHER): Payer: Medicare Other | Admitting: Ophthalmology

## 2022-08-07 ENCOUNTER — Encounter (HOSPITAL_BASED_OUTPATIENT_CLINIC_OR_DEPARTMENT_OTHER): Payer: Self-pay | Admitting: Urology

## 2022-08-07 ENCOUNTER — Other Ambulatory Visit: Payer: Self-pay

## 2022-08-07 ENCOUNTER — Emergency Department (HOSPITAL_BASED_OUTPATIENT_CLINIC_OR_DEPARTMENT_OTHER)
Admission: EM | Admit: 2022-08-07 | Discharge: 2022-08-07 | Disposition: A | Discharge status: Home / Self Care | Payer: Medicare Other | Attending: Emergency Medicine | Admitting: Emergency Medicine

## 2022-08-07 ENCOUNTER — Encounter: Payer: Self-pay | Admitting: Interventional Cardiology

## 2022-08-07 ENCOUNTER — Ambulatory Visit
Admission: EM | Admit: 2022-08-07 | Discharge: 2022-08-07 | Disposition: A | Discharge status: Home / Self Care | Payer: Medicare Other

## 2022-08-07 DIAGNOSIS — I10 Essential (primary) hypertension: Secondary | ICD-10-CM | POA: Insufficient documentation

## 2022-08-07 DIAGNOSIS — E113513 Type 2 diabetes mellitus with proliferative diabetic retinopathy with macular edema, bilateral: Secondary | ICD-10-CM

## 2022-08-07 DIAGNOSIS — E119 Type 2 diabetes mellitus without complications: Secondary | ICD-10-CM | POA: Insufficient documentation

## 2022-08-07 DIAGNOSIS — H43811 Vitreous degeneration, right eye: Secondary | ICD-10-CM

## 2022-08-07 DIAGNOSIS — Z7902 Long term (current) use of antithrombotics/antiplatelets: Secondary | ICD-10-CM | POA: Insufficient documentation

## 2022-08-07 DIAGNOSIS — Z79899 Other long term (current) drug therapy: Secondary | ICD-10-CM | POA: Diagnosis not present

## 2022-08-07 DIAGNOSIS — R42 Dizziness and giddiness: Secondary | ICD-10-CM | POA: Diagnosis present

## 2022-08-07 DIAGNOSIS — Z794 Long term (current) use of insulin: Secondary | ICD-10-CM | POA: Diagnosis not present

## 2022-08-07 DIAGNOSIS — I2510 Atherosclerotic heart disease of native coronary artery without angina pectoris: Secondary | ICD-10-CM | POA: Insufficient documentation

## 2022-08-07 DIAGNOSIS — H35033 Hypertensive retinopathy, bilateral: Secondary | ICD-10-CM | POA: Diagnosis not present

## 2022-08-07 LAB — CBC
HCT: 46 % (ref 39.0–52.0)
Hemoglobin: 15.3 g/dL (ref 13.0–17.0)
MCH: 30.2 pg (ref 26.0–34.0)
MCHC: 33.3 g/dL (ref 30.0–36.0)
MCV: 90.9 fL (ref 80.0–100.0)
Platelets: 233 10*3/uL (ref 150–400)
RBC: 5.06 MIL/uL (ref 4.22–5.81)
RDW: 13.4 % (ref 11.5–15.5)
WBC: 6.9 10*3/uL (ref 4.0–10.5)
nRBC: 0 % (ref 0.0–0.2)

## 2022-08-07 LAB — BASIC METABOLIC PANEL
Anion gap: 8 (ref 5–15)
BUN: 16 mg/dL (ref 8–23)
CO2: 25 mmol/L (ref 22–32)
Calcium: 8.8 mg/dL — ABNORMAL LOW (ref 8.9–10.3)
Chloride: 103 mmol/L (ref 98–111)
Creatinine, Ser: 1.05 mg/dL (ref 0.61–1.24)
GFR, Estimated: >60 mL/min (ref >60)
Glucose, Bld: 88 mg/dL (ref 70–99)
Potassium: 3.8 mmol/L (ref 3.5–5.1)
Sodium: 136 mmol/L (ref 135–145)

## 2022-08-07 LAB — TROPONIN I (HIGH SENSITIVITY): Troponin I (High Sensitivity): 7 ng/L (ref <18)

## 2022-08-07 NOTE — ED Notes (Signed)
Pt sent to ED after review of VS w/ Dr Chaney Malling. Abbreviated triage due to elevated BP.  Patient is being discharged from the Urgent Care and sent to the Emergency Department via POV w/ s/o . Per Dr Chaney Malling, patient is in need of higher level of care due to elevated BP and need for further testing. . Patient is aware and verbalizes understanding of plan of care.  Vitals:   08/07/22 2000  BP: (!) 203/98  Pulse: 76  Resp: 16  Temp: 98.3 F (36.8 C)  SpO2: 96%  To [facility]

## 2022-08-07 NOTE — ED Provider Notes (Signed)
[place] EMERGENCY DEPARTMENT AT [facility] [place] Provider Note   CSN: [id number] Arrival date & time: 08/07/22  2023     History  No chief complaint on file.   Jeremy Johnston is a 72 y.o. male.  Patient is a 71 year old male with a past medical history of hypertension, diabetes, CAD presenting to the emergency department with high blood pressure.  Patient states while bowling about 2 weeks ago he had lightheadedness and he has been checking his blood pressure since then.  He states that it has been fluctuating between the 110s systolic to the 150s systolic when he normally is well-controlled.  He states that today when he checked his blood pressure it was in the 160s systolic and he rechecked it after dinner and it was over 200.  He states that he went to urgent care who recommended that he come to the ER.  He states that he has had no further dizzy episodes, no chest pain or shortness of breath, no numbness or weakness.  He states he did have an eye procedure today and had his right eye dilated but has not had any pain in his eye.  The history is provided by the patient and the spouse.       Home Medications Prior to Admission medications   Medication Sig Start Date End Date Taking? Authorizing Provider  Aflibercept (EYLEA IO) Inject into the eye.    [provider]  aspirin 325 MG tablet Take 325 mg by mouth as needed for mild pain (REPORTS TAKING 2-3 TABLETS FOR SLEEP.). 04/03/18   [provider]  brimonidine (ALPHAGAN) 0.15 % ophthalmic solution Place 1 drop into both eyes 2 (two) times daily. 09/28/16   [provider]  ciprofloxacin (CILOXAN) 0.3 % ophthalmic solution Place 1 drop into both eyes 4 (four) times daily as needed. 11/17/21   [provider]  clopidogrel (PLAVIX) 75 MG tablet Take 1 tablet (75 mg total) by mouth daily with breakfast. 05/25/22   Corky Crafts, MD  dorzolamide-timolol (COSOPT) 22.3-6.8 MG/ML ophthalmic  solution Place 1 drop into the right eye 2 (two) times daily.    [provider]  famotidine (PEPCID) 20 MG tablet Take 20 mg by mouth daily as needed for heartburn or indigestion.    [provider]  gabapentin (NEURONTIN) 800 MG tablet Take 1 tablet (800 mg total) by mouth at bedtime. 07/03/22   Monica Becton, MD  Insulin Aspart, w/Niacinamide, (FIASP) 100 UNIT/ML SOLN Inject as directed. Basal rate 1unit/hr in pump    [provider]  Insulin Human (INSULIN PUMP) 100 unit/ml SOLN Inject into the skin. Use with insulin as directed    [provider]  ipratropium (ATROVENT) 0.03 % nasal spray Place 2 sprays into both nostrils every 12 (twelve) hours. 03/30/22   Christen Butter, NP  lisinopril (ZESTRIL) 5 MG tablet TAKE 1 TABLET daily 05/25/22   Corky Crafts, MD  loratadine (CLARITIN) 10 MG tablet Take 10 mg by mouth daily as needed for allergies.    [provider]  Multiple Vitamin (MULTIVITAMIN WITH MINERALS) TABS tablet Take 1 tablet by mouth 2 (two) times a week.     [provider]  rosuvastatin (CRESTOR) 40 MG tablet Take 1 tablet (40 mg total) by mouth at bedtime. 05/25/22   Corky Crafts, MD      Allergies    Insulin lispro    Review of Systems   Review of Systems  Physical Exam Updated  Vital Signs BP (!) 183/85   Pulse 72   Temp 98.8 F (37.1 C)   Resp 18   Ht 5\' 7"  (1.702 m)   Wt 73.2 kg   SpO2 99%   BMI 25.28 kg/m  Physical Exam Vitals and nursing note reviewed.  Constitutional:      General: He is not in acute distress.    Appearance: Normal appearance.  HENT:     Head: Normocephalic and atraumatic.     Nose: Nose normal.     Mouth/Throat:     Mouth: Mucous membranes are moist.     Pharynx: Oropharynx is clear.  Eyes:     Extraocular Movements: Extraocular movements intact.     Conjunctiva/sclera: Conjunctivae normal.     Comments: R pupil dilated, L pupil reactive  Cardiovascular:      Rate and Rhythm: Normal rate and regular rhythm.     Heart sounds: Normal heart sounds.  Pulmonary:     Effort: Pulmonary effort is normal.     Breath sounds: Normal breath sounds.  Abdominal:     General: Abdomen is flat.     Palpations: Abdomen is soft.     Tenderness: There is no abdominal tenderness.  Musculoskeletal:        General: Normal range of motion.     Cervical back: Normal range of motion and neck supple.  Skin:    General: Skin is warm and dry.  Neurological:     General: No focal deficit present.     Mental Status: He is alert and oriented to person, place, and time.     Cranial Nerves: No cranial nerve deficit.     Sensory: No sensory deficit.     Motor: No weakness.     Coordination: Coordination normal.  Psychiatric:        Mood and Affect: Mood normal.        Behavior: Behavior normal.     ED Results / Procedures / Treatments   Labs (all labs ordered are listed, but only abnormal results are displayed) Labs Reviewed  BASIC METABOLIC PANEL - Abnormal; Notable for the following components:      Result Value   Calcium 8.8 (*)    All other components within normal limits  CBC  TROPONIN I (HIGH SENSITIVITY)    EKG EKG Interpretation  Date/Time:  Monday Aug 07 2022 20:31:14 EDT Ventricular Rate:  84 PR Interval:  149 QRS Duration: 92 QT Interval:  364 QTC Calculation: 431 R Axis:   119 Text Interpretation: Sinus rhythm Inferior infarct, age indeterminate Lateral leads are also involved No previous ECGs available Confirmed by Elayne Snare ([id number]) on 08/07/2022 9:44:37 PM  Radiology No results found.  Procedures Procedures    Medications Ordered in ED Medications - No data to display  ED Course/ Medical Decision Making/ A&P                             Medical Decision Making This patient presents to the ED with chief complaint(s) of HTN with pertinent past medical history of HTN, CAD, DM which further complicates the presenting  complaint. The complaint involves an extensive differential diagnosis and also carries with it a high risk of complications and morbidity.    The differential diagnosis includes asymptomatic hypertension, hypertensive emergency, no focal neurologic deficits making CVA unlikely  Additional history obtained: Additional history obtained from spouse Records reviewed urgent care records  ED Course and  Reassessment: On patient's arrival to the emergency department his blood pressure was in the 180s systolic and otherwise hemodynamically stable.  EKG on arrival showed normal sinus rhythm without acute ischemic changes.  He was evaluated by triage and had labs including troponin performed.  Labs are within normal range with no signs of endorgan damage including a negative troponin.  Because the patient has not had consistently high readings, I will not change his blood pressure medication at this time and he was recommended close primary care or cardiology follow-up for blood pressure recheck and was given strict return precautions.  Independent labs interpretation:  The following labs were independently interpreted: No acute disease  Independent visualization of imaging: N/A  Consultation: - Consulted or discussed management/test interpretation w/ external professional: N/A  Consideration for admission or further workup: Patient has no emergent conditions requiring admission or further work-up at this time and is stable for discharge home with primary care follow-up  Social Determinants of health: N/A    Amount and/or Complexity of Data Reviewed Labs: ordered.          Final Clinical Impression(s) / ED Diagnoses Final diagnoses:  Asymptomatic hypertension    Rx / DC Orders ED Discharge Orders     None         Rexford Maus, DO 08/07/22 2238

## 2022-08-07 NOTE — Telephone Encounter (Signed)
This encounter was created in error - please disregard.

## 2022-08-07 NOTE — ED Triage Notes (Signed)
Pt sent from UC for HTN, was 210/112 at [facility]  Had eye procedure today and it went up to 165/80 at home Denies any pain, denies vision problems  Takes lisinopril 5mg /day, took earlier today

## 2022-08-07 NOTE — ED Triage Notes (Addendum)
BP was 183/103 at home 30 min pta w/ BP cuff at home Pt denies chest pain  Eyes were dilated today for an eye appt Pt wanted to compare BP here to cuff from home  210/112 w/ home cuff  in triage

## 2022-08-07 NOTE — Discharge Instructions (Signed)
You were seen in the emergency department for your high blood pressure.  You had no signs of stroke or injury to your heart or kidneys from her blood pressure being high.  Because your blood pressures have not been consistently elevated we did not make any changes to your medications at this time and you should follow-up with your primary doctor or your cardiologist to have your blood pressure rechecked.  You should return to the emergency department if you are having severe chest pain, numbness or weakness on one side the body compared to the other or if you have any other new or concerning symptoms.

## 2022-08-08 ENCOUNTER — Telehealth: Payer: Self-pay | Admitting: Interventional Cardiology

## 2022-08-08 NOTE — Telephone Encounter (Signed)
Returned call to patient.  Patient reports he went to ED yesterday after being seen in urgent care for elevated BP. BP at urgent care 203/98, later in ED BP 183/85. EKG and labs clear, no medications given.  Patient reports the following BP readings from home (cuff accuracy verified at urgent care): 5/7 @ 9:56am----182/97 5/7 @ 8:28am---179/80 5/7@7 :58am---143/78 5/7 @7 :36am---141/85 (after waking up)  5/6 @11 :52pm (after ED)---163/82 5/6 @11 :35pm---154/86 5/6 @7 :20pm---183/103 (prompted visit to UC/ED) 5/6 @6 :35am---165/90 5/6 @6 :00am---169/84  5/5 5:30pm---125/61 5/5 @2 :30pm---141/78 5/5 @1 :00pm---124/68 5/5 @9 :36am---140/77 5/5 @9 :20am---140/72 5/5 @6 :47am---134/72 (after waking up)  Patient denies any CP, SOB, headaches or visual changes.  When asked about any recent changes or stress he reports his sister has been in the hospital after after a heart attack and is now in a coma. He reports he is not able to be with her and feels some guilt over this, but denies any major stress.  He also reports about 2 months ago he had flu-like symptoms for 2 weeks that resolved, no reported infection. Then about 6-8 weeks ago he had slept on the couch and had neck pain that he feels was further exacerbated from carrying double ball bowling bag. He has had muscle tension in upper back and back of neck that is slowly resolving. He was concerned about stroke or issues with his carotids. Visit in ED states CVA unlikely.  Patient also reports about 2 weeks ago he stood up to bowl and felt lightheaded, he sat down for a couple minutes and lightheadedness resolved. No issues since then.  He states he has been eating more processed deli meat and is working on cutting that back out, but otherwise no changes in diet.  Patient also states he recently started gabapentin for sciatic pain, he takes 800mg  at bedtime.  Patient is concerned all of these events may have led to his blood pressure being  elevated. Encouraged patient to take deep breaths and engage in stress-lowering activities that he enjoys. Stress can increase BP. Advised patient to only check BP in the morning and the evening, and any time he has symptoms (headaches/dizziness/etc). Checking too frequently can cause elevated readings. Patient verbalized understanding.  Patient is currently taking Lisinopril 5mg  QD. He states he had been on quinipril 5mg  QD previously and would like to try taking this medication again if Dr. Eldridge Dace thinks it would help bring his BP back down.  Will forward to Dr. Eldridge Dace to review and advise.

## 2022-08-08 NOTE — Telephone Encounter (Signed)
Pt c/o BP issue: STAT if pt c/o blurred vision, one-sided weakness or slurred speech  1. What are your last 5 BP readings?   This morning  141/85 (when first got up) 143/78 (15 minutes later)  2. Are you having any other symptoms (ex. Dizziness, headache, blurred vision, passed out)?    No  3. What is your BP issue?    Patient stated he went to Urgent Care who sent him to the ED due to high BP.  Patient stated a couple of weeks ago he had a dizzy spell and is concerned it was related to his high BP.

## 2022-08-11 NOTE — Telephone Encounter (Signed)
WOuld increase lisinopril to 10 mg daily and see how BP does.  If he prefers quinapril over lisinopril, would change to quinapril to 10 mg daily. BMet in a week. COntinue to monitor BP.  Low salt diet.  150 minutes of exercise /week.

## 2022-08-11 NOTE — Telephone Encounter (Signed)
I spoke with patient and gave him information from Dr Eldridge Dace.  Patient reports later in the afternoon on 5/7 his BP returned to normal.  Reports readings of 116/63 and 100/57. Reading on 5/8 was 139/75.  On 5/9 BP was 118/63 and 127/71.  This AM it is 124/75.   Patient will continue current dose of lisinopril since BP has improved.  He will continue to monitor BP and let us know if it runs 130/80 or greater

## 2022-09-08 ENCOUNTER — Encounter: Payer: Self-pay | Admitting: Interventional Cardiology

## 2022-09-15 ENCOUNTER — Ambulatory Visit (INDEPENDENT_AMBULATORY_CARE_PROVIDER_SITE_OTHER): Payer: Medicare Other | Admitting: *Deleted

## 2022-09-15 VITALS — BP 122/70 | HR 71 | Temp 98.0°F | Resp 16 | Ht 67.0 in | Wt 162.2 lb

## 2022-09-15 DIAGNOSIS — I25119 Atherosclerotic heart disease of native coronary artery with unspecified angina pectoris: Secondary | ICD-10-CM

## 2022-09-15 DIAGNOSIS — E782 Mixed hyperlipidemia: Secondary | ICD-10-CM | POA: Diagnosis not present

## 2022-09-15 MED ORDER — INCLISIRAN SODIUM 284 MG/1.5ML ~~LOC~~ SOSY
284.0000 mg | PREFILLED_SYRINGE | Freq: Once | SUBCUTANEOUS | Status: AC
  Administered 2022-09-15: 284 mg via SUBCUTANEOUS
  Filled 2022-09-15: qty 1.5

## 2022-09-15 NOTE — Progress Notes (Signed)
Diagnosis: Hyperlipidemia  Provider:  Mannam, Praveen MD  Procedure: Injection  [name] (inclisiran), Dose: 284 mg, Site: subcutaneous, Number of injections: 1  Post Care: Observation period completed  Discharge: Condition: Good, Destination: Home . AVS Provided  Performed by:  Antuan Limes A, RN       

## 2022-09-18 ENCOUNTER — Encounter (INDEPENDENT_AMBULATORY_CARE_PROVIDER_SITE_OTHER): Payer: Medicare Other | Admitting: Ophthalmology

## 2022-09-18 DIAGNOSIS — E113313 Type 2 diabetes mellitus with moderate nonproliferative diabetic retinopathy with macular edema, bilateral: Secondary | ICD-10-CM | POA: Diagnosis not present

## 2022-09-18 DIAGNOSIS — H35371 Puckering of macula, right eye: Secondary | ICD-10-CM

## 2022-09-18 DIAGNOSIS — H43811 Vitreous degeneration, right eye: Secondary | ICD-10-CM

## 2022-09-18 DIAGNOSIS — H35033 Hypertensive retinopathy, bilateral: Secondary | ICD-10-CM

## 2022-09-18 DIAGNOSIS — I10 Essential (primary) hypertension: Secondary | ICD-10-CM | POA: Diagnosis not present

## 2022-10-23 ENCOUNTER — Encounter (INDEPENDENT_AMBULATORY_CARE_PROVIDER_SITE_OTHER): Payer: Medicare Other | Admitting: Ophthalmology

## 2022-10-23 DIAGNOSIS — I10 Essential (primary) hypertension: Secondary | ICD-10-CM | POA: Diagnosis not present

## 2022-10-23 DIAGNOSIS — H43811 Vitreous degeneration, right eye: Secondary | ICD-10-CM | POA: Diagnosis not present

## 2022-10-23 DIAGNOSIS — H35033 Hypertensive retinopathy, bilateral: Secondary | ICD-10-CM | POA: Diagnosis not present

## 2022-10-23 DIAGNOSIS — E113513 Type 2 diabetes mellitus with proliferative diabetic retinopathy with macular edema, bilateral: Secondary | ICD-10-CM

## 2022-10-23 DIAGNOSIS — Z794 Long term (current) use of insulin: Secondary | ICD-10-CM

## 2022-11-13 ENCOUNTER — Ambulatory Visit (INDEPENDENT_AMBULATORY_CARE_PROVIDER_SITE_OTHER): Payer: Medicare Other | Admitting: Medical-Surgical

## 2022-11-13 VITALS — BP 188/77 | HR 65 | Ht 67.0 in | Wt 166.1 lb

## 2022-11-13 DIAGNOSIS — Z0000 Encounter for general adult medical examination without abnormal findings: Secondary | ICD-10-CM

## 2022-11-13 NOTE — Progress Notes (Signed)
MEDICARE ANNUAL WELLNESS VISIT  11/13/2022  Subjective:  Jeremy Johnston is a 72 y.o. male patient of Jeremy Butter, NP who had a Medicare Annual Wellness Visit today. Jeremy Johnston is Retired and lives with their spouse. he has 3 children. he reports that he is socially active and does interact with friends/family regularly. he is moderately physically active and enjoys bowling.  Patient Care Team: Jeremy Butter, NP as PCP - General (Nurse Practitioner) Jeremy Crafts, MD as PCP - Cardiology (Cardiology) Jeremy George, MD as Consulting Physician (Ophthalmology)     11/13/2022    1:20 PM 08/07/2022    8:27 PM 11/07/2021    1:25 PM 08/16/2021    9:03 AM 12/12/2016   11:00 PM 07/22/2015    2:11 PM  Advanced Directives  Does Patient Have a Medical Advance Directive? No No No Yes No No  Type of Theme park manager;Out of facility DNR (pink MOST or yellow form)    Does patient want to make changes to medical advance directive? Yes (MAU/Ambulatory/Procedural Areas - Information given)   No - Patient declined    Would patient like information on creating a medical advance directive?   No - Patient declined  No - Patient declined No - patient declined information    Hospital Utilization Over the Past 12 Months: # of hospitalizations or ER visits: 1 # of surgeries: 0  Review of Systems    Patient reports that his overall health is unchanged when compared to last year.  Review of Systems: History obtained from chart review and the patient  All other systems negative.  Pain Assessment Pain : No/denies pain     Current Medications & Allergies (verified) Allergies as of 11/13/2022       Reactions   Insulin Lispro Other (See Comments)   Meloxicam    Other Reaction(s): shakes and off balance        Medication List        Accurate as of November 13, 2022  1:48 PM. If you have any questions, ask your nurse or doctor.          STOP taking these  medications    ipratropium 0.03 % nasal spray Commonly known as: ATROVENT       TAKE these medications    aspirin 325 MG tablet Take 325 mg by mouth as needed for mild pain (REPORTS TAKING 2-3 TABLETS FOR SLEEP.).   brimonidine 0.15 % ophthalmic solution Commonly known as: ALPHAGAN Place 1 drop into both eyes 2 (two) times daily.   ciprofloxacin 0.3 % ophthalmic solution Commonly known as: CILOXAN Place 1 drop into both eyes 4 (four) times daily as needed.   clopidogrel 75 MG tablet Commonly known as: PLAVIX Take 1 tablet (75 mg total) by mouth daily with breakfast.   dorzolamide-timolol 2-0.5 % ophthalmic solution Commonly known as: COSOPT Place 1 drop into the right eye 2 (two) times daily.   EYLEA IO Inject into the eye.   famotidine 20 MG tablet Commonly known as: PEPCID Take 20 mg by mouth daily as needed for heartburn or indigestion.   Fiasp 100 UNIT/ML Soln Generic drug: Insulin Aspart (w/Niacinamide) Inject as directed. Basal rate 1unit/hr in pump   gabapentin 800 MG tablet Commonly known as: Neurontin Take 1 tablet (800 mg total) by mouth at bedtime.   insulin pump Soln Inject into the skin. Use with insulin as directed   [name] Jeremy Johnston Inject into the skin. Managed by Dr.  Varanasi   lisinopril 5 MG tablet Commonly known as: ZESTRIL TAKE 1 TABLET daily   loratadine 10 MG tablet Commonly known as: CLARITIN Take 10 mg by mouth daily as needed for allergies.   multivitamin with minerals Tabs tablet Take 1 tablet by mouth 2 (two) times a week.   rosuvastatin 40 MG tablet Commonly known as: CRESTOR Take 1 tablet (40 mg total) by mouth at bedtime.        History (reviewed): Past Medical History:  Diagnosis Date   Arthritis    Cancer (HCC)    kidney cancer-both kidneys remain   Chronic kidney disease    Robotic Kidney surgery for kidney cancer tumor-kidney remains-   Coronary artery disease    Diabetes mellitus (HCC)    Insulin pump -[name],  and has a glucose transmitter attached.   Diabetic retinopathy (HCC)    left eye   Hyperlipidemia    Hypertension    Sciatica    Vitreous hemorrhage (HCC)    left eye   Past Surgical History:  Procedure Laterality Date   CATARACT EXTRACTION, BILATERAL Bilateral    11'15   CORONARY ARTERY BYPASS GRAFT  01/2007   x4 vessels([place])   DIAGNOSTIC LAPAROSCOPY     EYE SURGERY     retina peele   HERNIA REPAIR     KIDNEY SURGERY     tumor removed   PARS PLANA VITRECTOMY 27 GAUGE Left 12/12/2016   Procedure: PARS PLANA VITRECTOMY 27 GAUGE , ENDOLASER GAS/ FLUID EXCHAGNE;  Surgeon: Jeremy George, MD;  Location: [facility] OR;  Service: Ophthalmology;  Laterality: Left;   VASECTOMY     Family History  Problem Relation Age of Onset   Heart attack Father    Heart attack Sister    Diabetes Sister    Healthy Brother    Healthy Half-Brother    Healthy Half-Brother    Healthy Half-Brother    Healthy Half-Sister    Healthy Half-Sister    Social History   Socioeconomic History   Marital status: Married    Spouse name: Jeremy Johnston   Number of children: 3   Years of education: 14   Highest education level: Associate degree: academic program  Occupational History   Occupation: Retired  Tobacco Use   Smoking status: Never   Smokeless tobacco: Never  Vaping Use   Vaping status: Never Used  Substance and Sexual Activity   Alcohol use: No   Drug use: No   Sexual activity: Yes    Birth control/protection: None  Other Topics Concern   Not on file  Social History Narrative   Lives with his wife. Exercise bowl 4-5 times a week. Treadmill once a week.    Social Determinants of Health   Financial Resource Strain: Low Risk  (11/13/2022)   Overall Financial Resource Strain (CARDIA)    Difficulty of Paying Living Expenses: Not hard at all  Food Insecurity: No Food Insecurity (11/13/2022)   Hunger Vital Sign    Worried About Running Out of Food in the Last Year: Never true    Ran Out of  Food in the Last Year: Never true  Transportation Needs: No Transportation Needs (11/13/2022)   PRAPARE - Administrator, Civil Service (Medical): No    Lack of Transportation (Non-Medical): No  Physical Activity: Sufficiently Active (11/13/2022)   Exercise Vital Sign    Days of Exercise per Week: 5 days    Minutes of Exercise per Session: 120 min  Stress: No  Stress Concern Present (11/13/2022)   [employer] of Occupational Health - Occupational Stress Questionnaire    Feeling of Stress : Not at all  Social Connections: Moderately Integrated (11/13/2022)   Social Connection and Isolation Panel [NHANES]    Frequency of Communication with Friends and Family: More than three times a week    Frequency of Social Gatherings with Friends and Family: More than three times a week    Attends Religious Services: Never    Database administrator or Organizations: Yes    Attends Engineer, structural: More than 4 times per year    Marital Status: Married    Activities of Daily Living    11/13/2022    1:21 PM  In your present state of health, do you have any difficulty performing the following activities:  Hearing? 0  Vision? 0  Difficulty concentrating or making decisions? 0  Walking or climbing stairs? 0  Dressing or bathing? 0  Doing errands, shopping? 0  Preparing Food and eating ? N  Using the Toilet? N  In the past six months, have you accidently leaked urine? N  Do you have problems with loss of bowel control? N  Managing your Medications? N  Managing your Finances? N  Housekeeping or managing your Housekeeping? N    Patient Education/Literacy How often do you need to have someone help you when you read instructions, pamphlets, or other written materials from your doctor or pharmacy?: 1 - Never What is the last grade level you completed in school?: Associates degree  Exercise    Diet Patient reports consuming  2-3  meals a day and 0-1 snack(s) a  day Patient reports that his primary diet is: Regular Patient reports that she does have regular access to food.   Depression Screen    11/13/2022    1:26 PM 03/30/2022    2:08 PM 11/07/2021    1:28 PM 08/15/2021    2:16 PM 07/22/2015    2:11 PM  PHQ 2/9 Scores  PHQ - 2 Score 0 0 0 0 0     Fall Risk    11/13/2022    1:25 PM 11/07/2021    1:29 PM 08/15/2021    2:16 PM 07/22/2015    2:11 PM 07/03/2013    4:13 PM  Fall Risk   Falls in the past year? 0 0 0 No No  Number falls in past yr: 0 0 0    Injury with Fall? 0 0 0    Risk for fall due to : No Fall Risks No Fall Risks No Fall Risks    Follow up Falls evaluation completed Falls evaluation completed Falls prevention discussed;Falls evaluation completed       Objective:   BP (!) 188/77 (BP Location: Left Arm, Patient Position: Sitting, Cuff Size: Normal)   Pulse 65   Ht 5\' 7"  (1.702 m)   Wt 166 lb 1.3 oz (75.3 kg)   SpO2 97%   BMI 26.01 kg/m   Last Weight  Most recent update: 11/13/2022  1:10 PM    Weight  75.3 kg (166 lb 1.3 oz)             Body mass index is 26.01 kg/m.  Hearing/Vision  Reyaansh did not have difficulty with hearing/understanding during the face-to-face interview Paulus did not have difficulty with his vision during the face-to-face interview Reports that he has had a formal eye exam by an eye care professional within the past year Reports that  he has not had a formal hearing evaluation within the past year  Cognitive Function:    11/13/2022    1:29 PM 11/07/2021    1:36 PM  6CIT Screen  What Year? 0 points 0 points  What month? 0 points 0 points  What time? 0 points 0 points  Count back from 20 0 points 0 points  Months in reverse 0 points 0 points  Repeat phrase 0 points 0 points  Total Score 0 points 0 points    Normal Cognitive Function Screening: Yes (Normal:0-7, Significant for Dysfunction: >8)  Immunization & Health Maintenance Record Immunization History  Administered Date(s)  Administered   Fluad Quad(high Dose 65+) 12/22/2021   Influenza Split 12/25/2012, 02/18/2014   Influenza, High Dose Seasonal PF 01/20/2019   Influenza,inj,Quad PF,6+ Mos 12/13/2015   Influenza,inj,quad, With Preservative 06/03/2015   Moderna Sars-Covid-2 Vaccination 06/16/2019, 07/14/2019   PNEUMOCOCCAL CONJUGATE-20 08/15/2021   Pneumococcal Conjugate-13 11/21/2017   Pneumococcal Polysaccharide-23 08/26/2013   Tdap 10/17/2017    Health Maintenance  Topic Date Due   Diabetic kidney evaluation - Urine ACR  11/24/2022   COVID-19 Vaccine (3 - 2023-24 season) 11/29/2022 (Originally 12/02/2021)   Zoster Vaccines- Shingrix (1 of 2) 02/13/2023 (Originally 01/16/2001)   INFLUENZA VACCINE  07/02/2023 (Originally 11/02/2022)   Hepatitis C Screening  11/13/2023 (Originally 01/16/1969)   HEMOGLOBIN A1C  12/10/2022   Diabetic kidney evaluation - eGFR measurement  08/07/2023   FOOT EXAM  09/14/2023   OPHTHALMOLOGY EXAM  09/18/2023   Medicare Annual Wellness (AWV)  11/13/2023   Fecal DNA (Cologuard)  08/22/2024   DTaP/Tdap/Td (2 - Td or Tdap) 10/18/2027   Pneumonia Vaccine 92+ Years old  Completed   HPV VACCINES  Aged Out       Assessment  This is a routine wellness examination for [employer].  Health Maintenance: Due or Overdue Health Maintenance Due  Topic Date Due   Diabetic kidney evaluation - Urine ACR  11/24/2022    Ervin Knack does not need a referral for Community Assistance: Care Management:   no Social Work:    no Prescription Assistance:  no Nutrition/Diabetes Education:  no   Plan:  Personalized Goals  Goals Addressed               This Visit's Progress     Patient Stated (pt-stated)        11/13/2022 AWV Goal: Diabetes Management  Patient will maintain an A1C level below 6.7 Patient will not develop any diabetic foot complications Patient will not experience any hypoglycemic episodes over the next 3 months Patient will notify our office of any CBG readings  outside of the provider recommended range by calling [phone] Patient will adhere to provider recommendations for diabetes management  Patient Self Management Activities take all medications as prescribed and report any negative side effects monitor and record blood sugar readings as directed adhere to a low carbohydrate diet that incorporates lean proteins, vegetables, whole grains, low glycemic fruits check feet daily noting any sores, cracks, injuries, or callous formations see PCP or podiatrist if he notices any changes in his legs, feet, or toenails Patient will visit PCP and have an A1C level checked every 3 to 6 months as directed  have a yearly eye exam to monitor for vascular changes associated with diabetes and will request that the report be sent to his pcp.  consult with his PCP regarding any changes in his health or new or worsening symptoms  Personalized Health Maintenance & Screening Recommendations  Urine ACR- Dr. Sharl Ma  Shingles vaccine- patient declined  Lung Cancer Screening Recommended: no (Low Dose CT Chest recommended if Age 53-80 years, 20 pack-year currently smoking OR have quit w/in past 15 years) Hepatitis C Screening recommended: no HIV Screening recommended: no  Advanced Directives: Written information was not given per the patient's request.  Referrals & Orders No orders of the defined types were placed in this encounter.  Nurse notes: PCP made aware of patient's BP readings; patient denies any headache, chest pain, shortness of breath, unilateral weakness or blurred vision. He will contact his cardiologist about the home reading and elevated readings from office.  Follow-up Plan Follow-up with Jeremy Butter, NP as planned Medicare wellness visit in one year.  AVS printed and given to the patient.   I have personally reviewed and noted the following in the patient's chart:   Medical and social history Use of alcohol, tobacco or illicit drugs   Current medications and supplements Functional ability and status Nutritional status Physical activity Advanced directives List of other physicians Hospitalizations, surgeries, and ER visits in previous 12 months Vitals Screenings to include cognitive, depression, and falls Referrals and appointments  In addition, I have reviewed and discussed with patient certain preventive protocols, quality metrics, and best practice recommendations. A written personalized care plan for preventive services as well as general preventive health recommendations were provided to patient.     Modesto Charon, RN BSN  11/13/2022

## 2022-11-13 NOTE — Patient Instructions (Addendum)
MEDICARE ANNUAL WELLNESS VISIT Health Maintenance Summary and Written Plan of Care  [name] ,  Thank you for allowing me to perform your Medicare Annual Wellness Visit and for your ongoing commitment to your health.   Health Maintenance & Immunization History Health Maintenance  Topic Date Due   Diabetic kidney evaluation - Urine ACR  11/24/2022   COVID-19 Vaccine (3 - 2023-24 season) 11/29/2022 (Originally 12/02/2021)   Zoster Vaccines- Shingrix (1 of 2) 02/13/2023 (Originally 01/16/2001)   INFLUENZA VACCINE  07/02/2023 (Originally 11/02/2022)   Hepatitis C Screening  11/13/2023 (Originally 01/16/1969)   HEMOGLOBIN A1C  12/10/2022   Diabetic kidney evaluation - eGFR measurement  08/07/2023   FOOT EXAM  09/14/2023   OPHTHALMOLOGY EXAM  09/18/2023   Medicare Annual Wellness (AWV)  11/13/2023   Fecal DNA (Cologuard)  08/22/2024   DTaP/Tdap/Td (2 - Td or Tdap) 10/18/2027   Pneumonia Vaccine 35+ Years old  Completed   HPV VACCINES  Aged Out   Immunization History  Administered Date(s) Administered   Fluad Quad(high Dose 65+) 12/22/2021   Influenza Split 12/25/2012, 02/18/2014   Influenza, High Dose Seasonal PF 01/20/2019   Influenza,inj,Quad PF,6+ Mos 12/13/2015   Influenza,inj,quad, With Preservative 06/03/2015   Moderna Sars-Covid-2 Vaccination 06/16/2019, 07/14/2019   PNEUMOCOCCAL CONJUGATE-20 08/15/2021   Pneumococcal Conjugate-13 11/21/2017   Pneumococcal Polysaccharide-23 08/26/2013   Tdap 10/17/2017    These are the patient goals that we discussed:  Goals Addressed               This Visit's Progress     Patient Stated (pt-stated)        11/13/2022 AWV Goal: Diabetes Management  Patient will maintain an A1C level below 6.7 Patient will not develop any diabetic foot complications Patient will not experience any hypoglycemic episodes over the next 3 months Patient will notify our office of any CBG readings outside of the provider recommended range by calling  [phone] Patient will adhere to provider recommendations for diabetes management  Patient Self Management Activities take all medications as prescribed and report any negative side effects monitor and record blood sugar readings as directed adhere to a low carbohydrate diet that incorporates lean proteins, vegetables, whole grains, low glycemic fruits check feet daily noting any sores, cracks, injuries, or callous formations see PCP or podiatrist if he notices any changes in his legs, feet, or toenails Patient will visit PCP and have an A1C level checked every 3 to 6 months as directed  have a yearly eye exam to monitor for vascular changes associated with diabetes and will request that the report be sent to his pcp.  consult with his PCP regarding any changes in his health or new or worsening symptoms          This is a list of Health Maintenance Items that are overdue or due now: Health Maintenance Due  Topic Date Due   Diabetic kidney evaluation - Urine ACR  11/24/2022  Urine ACR- Dr. Sharl Ma  Shingles vaccine- patient declined   Orders/Referrals Placed Today: No orders of the defined types were placed in this encounter.  (Contact our referral department at [phone] if you have not spoken with someone about your referral appointment within the next 5 days)   Nurse notes: PCP made aware of patient's BP readings; patient denies any headache, chest pain, shortness of breath, unilateral weakness or blurred vision. He will contact his cardiologist about the home reading and elevated readings from office.  Follow-up Plan Follow-up with Christen Butter,  NP as planned Medicare wellness visit in one year.  AVS printed and given to the patient.      Health Maintenance, Male Adopting a healthy lifestyle and getting preventive care are important in promoting health and wellness. Ask your health care provider about: The right schedule for you to have regular tests and exams. Things  you can do on your own to prevent diseases and keep yourself healthy. What should I know about diet, weight, and exercise? Eat a healthy diet  Eat a diet that includes plenty of vegetables, fruits, low-fat dairy products, and lean protein. Do not eat a lot of foods that are high in solid fats, added sugars, or sodium. Maintain a healthy weight Body mass index (BMI) is a measurement that can be used to identify possible weight problems. It estimates body fat based on height and weight. Your health care provider can help determine your BMI and help you achieve or maintain a healthy weight. Get regular exercise Get regular exercise. This is one of the most important things you can do for your health. Most adults should: Exercise for at least 150 minutes each week. The exercise should increase your heart rate and make you sweat (moderate-intensity exercise). Do strengthening exercises at least twice a week. This is in addition to the moderate-intensity exercise. Spend less time sitting. Even light physical activity can be beneficial. Watch cholesterol and blood lipids Have your blood tested for lipids and cholesterol at 72 years of age, then have this test every 5 years. You may need to have your cholesterol levels checked more often if: Your lipid or cholesterol levels are high. You are older than 72 years of age. You are at high risk for heart disease. What should I know about cancer screening? Many types of cancers can be detected early and may often be prevented. Depending on your health history and family history, you may need to have cancer screening at various ages. This may include screening for: Colorectal cancer. Prostate cancer. Skin cancer. Lung cancer. What should I know about heart disease, diabetes, and high blood pressure? Blood pressure and heart disease High blood pressure causes heart disease and increases the risk of stroke. This is more likely to develop in people who have  high blood pressure readings or are overweight. Talk with your health care provider about your target blood pressure readings. Have your blood pressure checked: Every 3-5 years if you are 81-97 years of age. Every year if you are 66 years old or older. If you are between the ages of 71 and 19 and are a current or former smoker, ask your health care provider if you should have a one-time screening for abdominal aortic aneurysm (AAA). Diabetes Have regular diabetes screenings. This checks your fasting blood sugar level. Have the screening done: Once every three years after age 68 if you are at a normal weight and have a low risk for diabetes. More often and at a younger age if you are overweight or have a high risk for diabetes. What should I know about preventing infection? Hepatitis B If you have a higher risk for hepatitis B, you should be screened for this virus. Talk with your health care provider to find out if you are at risk for hepatitis B infection. Hepatitis C Blood testing is recommended for: Everyone born from [birth year] through 1965. Anyone with known risk factors for hepatitis C. Sexually transmitted infections (STIs) You should be screened each year for STIs, including gonorrhea and chlamydia,  if: You are sexually active and are younger than 72 years of age. You are older than 72 years of age and your health care provider tells you that you are at risk for this type of infection. Your sexual activity has changed since you were last screened, and you are at increased risk for chlamydia or gonorrhea. Ask your health care provider if you are at risk. Ask your health care provider about whether you are at high risk for HIV. Your health care provider may recommend a prescription medicine to help prevent HIV infection. If you choose to take medicine to prevent HIV, you should first get tested for HIV. You should then be tested every 3 months for as long as you are taking the medicine. Follow  these instructions at home: Alcohol use Do not drink alcohol if your health care provider tells you not to drink. If you drink alcohol: Limit how much you have to 0-2 drinks a day. Know how much alcohol is in your drink. In the [place] one drink equals one 12 oz bottle of beer (355 mL), one 5 oz glass of wine (148 mL), or one 1 oz glass of hard liquor (44 mL). Lifestyle Do not use any products that contain nicotine or tobacco. These products include cigarettes, chewing tobacco, and vaping devices, such as e-cigarettes. If you need help quitting, ask your health care provider. Do not use street drugs. Do not share needles. Ask your health care provider for help if you need support or information about quitting drugs. General instructions Schedule regular health, dental, and eye exams. Stay current with your vaccines. Tell your health care provider if: You often feel depressed. You have ever been abused or do not feel safe at home. Summary Adopting a healthy lifestyle and getting preventive care are important in promoting health and wellness. Follow your health care provider's instructions about healthy diet, exercising, and getting tested or screened for diseases. Follow your health care provider's instructions on monitoring your cholesterol and blood pressure. This information is not intended to replace advice given to you by your health care provider. Make sure you discuss any questions you have with your health care provider. Document Revised: 08/09/2020 Document Reviewed: 08/09/2020 Elsevier Patient Education  2024 [employer].

## 2022-11-27 ENCOUNTER — Encounter (INDEPENDENT_AMBULATORY_CARE_PROVIDER_SITE_OTHER): Payer: Medicare Other | Admitting: Ophthalmology

## 2022-11-29 ENCOUNTER — Encounter (INDEPENDENT_AMBULATORY_CARE_PROVIDER_SITE_OTHER): Payer: Medicare Other | Admitting: Ophthalmology

## 2022-11-29 DIAGNOSIS — H43811 Vitreous degeneration, right eye: Secondary | ICD-10-CM | POA: Diagnosis not present

## 2022-11-29 DIAGNOSIS — H35033 Hypertensive retinopathy, bilateral: Secondary | ICD-10-CM

## 2022-11-29 DIAGNOSIS — E113513 Type 2 diabetes mellitus with proliferative diabetic retinopathy with macular edema, bilateral: Secondary | ICD-10-CM

## 2022-11-29 DIAGNOSIS — I10 Essential (primary) hypertension: Secondary | ICD-10-CM | POA: Diagnosis not present

## 2022-12-01 ENCOUNTER — Ambulatory Visit (INDEPENDENT_AMBULATORY_CARE_PROVIDER_SITE_OTHER): Payer: Medicare Other | Admitting: Podiatry

## 2022-12-01 DIAGNOSIS — M79675 Pain in left toe(s): Secondary | ICD-10-CM | POA: Diagnosis not present

## 2022-12-01 DIAGNOSIS — M79674 Pain in right toe(s): Secondary | ICD-10-CM | POA: Diagnosis not present

## 2022-12-01 DIAGNOSIS — E1042 Type 1 diabetes mellitus with diabetic polyneuropathy: Secondary | ICD-10-CM | POA: Diagnosis not present

## 2022-12-01 DIAGNOSIS — B351 Tinea unguium: Secondary | ICD-10-CM | POA: Diagnosis not present

## 2022-12-01 NOTE — Progress Notes (Signed)
  Subjective:  Patient ID: Jeremy Johnston, male    DOB: 10/12/1950,   MRN: [id number]  Chief Complaint  Patient presents with   Nail Problem    Pt present today for pain in the left great toe nail    72 y.o. male presents for concern of thickened deformed left great toenail. Relates years ago he dropped a can on the toe and has grown in like this since. Relates only painful when steps on it a certain way or bangs it. He has had it trimmed in the past by Dr. Charlsie Merles before but did not remove as concern for blood flow.  Relates concern of thickened elongated and painful nails that are difficult to trim. Requesting to have them trimmed today. Relates burning and tingling in their feet. Patient is diabetic and last A1c was  Lab Results  Component Value Date   HGBA1C 7.2 11/23/2021   .   PCP:  Christen Butter, NP   . Denies any other pedal complaints. Denies n/v/f/c.   Past Medical History:  Diagnosis Date   Arthritis    Cancer (HCC)    kidney cancer-both kidneys remain   Chronic kidney disease    Robotic Kidney surgery for kidney cancer tumor-kidney remains-   Coronary artery disease    Diabetes mellitus (HCC)    Insulin pump -[name], and has a glucose transmitter attached.   Diabetic retinopathy (HCC)    left eye   Hyperlipidemia    Hypertension    Sciatica    Vitreous hemorrhage (HCC)    left eye    Objective:  Physical Exam: Vascular: DP/PT pulses 1/4 bilateral. CFT <3 seconds. Normal hair growth on digits. No edema.  Skin. No lacerations or abrasions bilateral feet. Hallux nail on left is thickened and dystrophic with subungual debris. Other nails normal in appearence but elongated.  Musculoskeletal: MMT 5/5 bilateral lower extremities in DF, PF, Inversion and Eversion. Deceased ROM in DF of ankle joint.  Neurological: Sensation intact to light touch.   Assessment:   1. Pain due to onychomycosis of toenails of both feet   2. Type 1 diabetes mellitus with diabetic  polyneuropathy (HCC)       Plan:  Patient was evaluated and treated and all questions answered. -Discussed and educated patient on diabetic foot care, especially with  regards to the vascular, neurological and musculoskeletal systems.  -Stressed the importance of good glycemic control and the detriment of not  controlling glucose levels in relation to the foot. -Discussed supportive shoes at all times and checking feet regularly.  -Mechanically debrided all nails 1-5 bilateral using sterile nail nipper and filed with dremel without incident  -Discussed removal (and would need ABIs prior) vs routine care and will continue with routine care.  -Answered all patient questions -Patient to return  in 3 months for at risk foot care -Patient advised to call the office if any problems or questions arise in the meantime.   Louann Sjogren, DPM

## 2022-12-07 ENCOUNTER — Ambulatory Visit (HOSPITAL_COMMUNITY)
Admission: RE | Admit: 2022-12-07 | Discharge: 2022-12-07 | Disposition: A | Discharge status: Home / Self Care | Payer: Medicare Other | Source: Ambulatory Visit | Attending: Interventional Cardiology | Admitting: Interventional Cardiology

## 2022-12-07 DIAGNOSIS — I6523 Occlusion and stenosis of bilateral carotid arteries: Secondary | ICD-10-CM | POA: Diagnosis present

## 2022-12-10 ENCOUNTER — Encounter: Payer: Self-pay | Admitting: Interventional Cardiology

## 2022-12-11 NOTE — Telephone Encounter (Signed)
Would continue current meds and try to increase exercise.  More recent readings are improved and slightly higher readings will improve with better lifestyle.   He could f/u with anyone going forward.  His CAD has been stable.

## 2022-12-12 ENCOUNTER — Other Ambulatory Visit: Payer: Self-pay | Admitting: *Deleted

## 2022-12-12 DIAGNOSIS — I6523 Occlusion and stenosis of bilateral carotid arteries: Secondary | ICD-10-CM

## 2022-12-12 MED ORDER — QUINAPRIL HCL 5 MG PO TABS: 5.0000 mg | ORAL_TABLET | Freq: Every day | ORAL | 2 refills | Status: DC

## 2022-12-12 MED ORDER — LISINOPRIL 5 MG PO TABS: 5.0000 mg | ORAL_TABLET | Freq: Every day | ORAL | 2 refills | Status: DC

## 2022-12-12 NOTE — Telephone Encounter (Signed)
Reviewed with Dr Eldridge Dace and OK to change to Quinapril 5 mg daily

## 2022-12-13 LAB — LAB REPORT - SCANNED
A1c: 6.7
Creatinine, POC: 170 mg/dL
EGFR: 69
Microalb Creat Ratio: 8.9
Microalbumin, Urine: 1.51

## 2023-01-08 ENCOUNTER — Encounter (INDEPENDENT_AMBULATORY_CARE_PROVIDER_SITE_OTHER): Payer: Medicare Other | Admitting: Ophthalmology

## 2023-01-08 DIAGNOSIS — I10 Essential (primary) hypertension: Secondary | ICD-10-CM | POA: Diagnosis not present

## 2023-01-08 DIAGNOSIS — Z794 Long term (current) use of insulin: Secondary | ICD-10-CM | POA: Diagnosis not present

## 2023-01-08 DIAGNOSIS — E113513 Type 2 diabetes mellitus with proliferative diabetic retinopathy with macular edema, bilateral: Secondary | ICD-10-CM

## 2023-01-08 DIAGNOSIS — H43811 Vitreous degeneration, right eye: Secondary | ICD-10-CM

## 2023-01-08 DIAGNOSIS — H35033 Hypertensive retinopathy, bilateral: Secondary | ICD-10-CM

## 2023-02-15 ENCOUNTER — Encounter: Payer: Self-pay | Admitting: Medical-Surgical

## 2023-02-15 ENCOUNTER — Encounter (INDEPENDENT_AMBULATORY_CARE_PROVIDER_SITE_OTHER): Payer: Medicare Other | Admitting: Sports Medicine

## 2023-02-15 DIAGNOSIS — M5416 Radiculopathy, lumbar region: Secondary | ICD-10-CM

## 2023-02-15 LAB — HM DIABETES EYE EXAM

## 2023-02-15 NOTE — Telephone Encounter (Signed)

## 2023-02-21 ENCOUNTER — Encounter (INDEPENDENT_AMBULATORY_CARE_PROVIDER_SITE_OTHER): Payer: Medicare Other | Admitting: Ophthalmology

## 2023-02-21 DIAGNOSIS — H35033 Hypertensive retinopathy, bilateral: Secondary | ICD-10-CM | POA: Diagnosis not present

## 2023-02-21 DIAGNOSIS — Z794 Long term (current) use of insulin: Secondary | ICD-10-CM

## 2023-02-21 DIAGNOSIS — H43811 Vitreous degeneration, right eye: Secondary | ICD-10-CM

## 2023-02-21 DIAGNOSIS — I10 Essential (primary) hypertension: Secondary | ICD-10-CM

## 2023-02-21 DIAGNOSIS — E113513 Type 2 diabetes mellitus with proliferative diabetic retinopathy with macular edema, bilateral: Secondary | ICD-10-CM

## 2023-02-23 ENCOUNTER — Encounter: Payer: Self-pay | Admitting: Podiatry

## 2023-02-23 ENCOUNTER — Ambulatory Visit (INDEPENDENT_AMBULATORY_CARE_PROVIDER_SITE_OTHER): Payer: Medicare Other | Admitting: Podiatry

## 2023-02-23 DIAGNOSIS — M79675 Pain in left toe(s): Secondary | ICD-10-CM | POA: Diagnosis not present

## 2023-02-23 DIAGNOSIS — E1042 Type 1 diabetes mellitus with diabetic polyneuropathy: Secondary | ICD-10-CM

## 2023-02-23 DIAGNOSIS — B351 Tinea unguium: Secondary | ICD-10-CM | POA: Diagnosis not present

## 2023-02-23 DIAGNOSIS — M79674 Pain in right toe(s): Secondary | ICD-10-CM | POA: Diagnosis not present

## 2023-02-23 NOTE — Progress Notes (Signed)
  Subjective:  Patient ID: Jeremy Johnston, male    DOB: 08-Jan-1951,   MRN: [id number]  No chief complaint on file.   72 y.o. male presents for concern of thickened deformed left great toenail. Relates concern of thickened elongated and painful nails that are difficult to trim. Requesting to have them trimmed today. Relates burning and tingling in their feet. Patient is diabetic and last A1c was  Lab Results  Component Value Date   HGBA1C 7.2 11/23/2021   .   PCP:  Christen Butter, NP   . Denies any other pedal complaints. Denies n/v/f/c.   Past Medical History:  Diagnosis Date   Arthritis    Cancer (HCC)    kidney cancer-both kidneys remain   Chronic kidney disease    Robotic Kidney surgery for kidney cancer tumor-kidney remains-   Coronary artery disease    Diabetes mellitus (HCC)    Insulin pump -[name], and has a glucose transmitter attached.   Diabetic retinopathy (HCC)    left eye   Hyperlipidemia    Hypertension    Sciatica    Vitreous hemorrhage (HCC)    left eye    Objective:  Physical Exam: Vascular: DP/PT pulses 1/4 bilateral. CFT <3 seconds. Normal hair growth on digits. No edema.  Skin. No lacerations or abrasions bilateral feet. Hallux nail on left is thickened and dystrophic with subungual debris. Other nails normal in appearence but elongated.  Musculoskeletal: MMT 5/5 bilateral lower extremities in DF, PF, Inversion and Eversion. Deceased ROM in DF of ankle joint.  Neurological: Sensation intact to light touch.   Assessment:   1. Pain due to onychomycosis of toenails of both feet   2. Type 1 diabetes mellitus with diabetic polyneuropathy (HCC)        Plan:  Patient was evaluated and treated and all questions answered. -Discussed and educated patient on diabetic foot care, especially with  regards to the vascular, neurological and musculoskeletal systems.  -Stressed the importance of good glycemic control and the detriment of not  controlling glucose  levels in relation to the foot. -Discussed supportive shoes at all times and checking feet regularly.  -Mechanically debrided all nails 1-5 bilateral using sterile nail nipper and filed with dremel without incident  -Discussed removal (and would need ABIs prior) vs routine care and will continue with routine care.  -Answered all patient questions -Patient to return  in 3 months for at risk foot care -Patient advised to call the office if any problems or questions arise in the meantime.   Louann Sjogren, DPM

## 2023-03-23 ENCOUNTER — Ambulatory Visit: Payer: Medicare Other

## 2023-04-05 ENCOUNTER — Ambulatory Visit: Payer: Medicare Other

## 2023-04-05 ENCOUNTER — Other Ambulatory Visit: Payer: Self-pay | Admitting: Sports Medicine

## 2023-04-05 ENCOUNTER — Encounter: Payer: Self-pay | Admitting: Sports Medicine

## 2023-04-05 ENCOUNTER — Ambulatory Visit (INDEPENDENT_AMBULATORY_CARE_PROVIDER_SITE_OTHER): Payer: Medicare Other | Admitting: Sports Medicine

## 2023-04-05 VITALS — BP 147/77 | HR 120 | Ht 67.0 in | Wt 162.0 lb

## 2023-04-05 DIAGNOSIS — M47812 Spondylosis without myelopathy or radiculopathy, cervical region: Secondary | ICD-10-CM

## 2023-04-05 DIAGNOSIS — R051 Acute cough: Secondary | ICD-10-CM

## 2023-04-05 DIAGNOSIS — M542 Cervicalgia: Secondary | ICD-10-CM | POA: Diagnosis not present

## 2023-04-05 DIAGNOSIS — R059 Cough, unspecified: Secondary | ICD-10-CM | POA: Insufficient documentation

## 2023-04-05 MED ORDER — DICLOFENAC SODIUM 75 MG PO TBEC
75.0000 mg | DELAYED_RELEASE_TABLET | Freq: Two times a day (BID) | ORAL | 3 refills | Status: DC
Start: 2023-04-05 — End: 2023-10-22

## 2023-04-05 MED ORDER — ALBUTEROL SULFATE HFA 108 (90 BASE) MCG/ACT IN AERS
2.0000 | INHALATION_SPRAY | Freq: Four times a day (QID) | RESPIRATORY_TRACT | 11 refills | Status: DC | PRN
Start: 2023-04-05 — End: 2023-04-05

## 2023-04-05 MED ORDER — ALBUTEROL SULFATE HFA 108 (90 BASE) MCG/ACT IN AERS
2.0000 | INHALATION_SPRAY | Freq: Four times a day (QID) | RESPIRATORY_TRACT | 11 refills | Status: DC | PRN
Start: 2023-04-05 — End: 2023-10-22

## 2023-04-05 NOTE — Assessment & Plan Note (Signed)
 Jeremy Johnston also has been having a cough for the last week, nonproductive and starting to improve. On exam he is speaking full sentences, no accessory muscle use, does not appear in respiratory distress. He does have coarse lung sounds bilaterally left worse than right with prolonged expiratory phase. I would like chest x-ray, and we will add albuterol . As he is starting to improve we will hold off on aggressive steroid or antibiotic treatment.

## 2023-04-05 NOTE — Assessment & Plan Note (Signed)
 This is a very pleasant 73 year old male, he has a history of on and off pain in the neck, more recently he had a viral type respiratory illness and was doing a lot of coughing, also with increasing pain, predominately axial with radiation into the occiput. Nothing radicular. His respiratory illness is improving but continues to have some neck pain which is also starting to improve. Exam is normal, we will treat him conservatively, diclofenac , x-rays, home physical therapy, return to see me as needed in 6 weeks.

## 2023-04-05 NOTE — Progress Notes (Signed)
    Procedures performed today:    None.  Independent interpretation of notes and tests performed by another provider:   None.  Brief History, Exam, Impression, and Recommendations:    Cervical spondylosis This is a very pleasant 73 year old male, he has a history of on and off pain in the neck, more recently he had a viral type respiratory illness and was doing a lot of coughing, also with increasing pain, predominately axial with radiation into the occiput. Nothing radicular. His respiratory illness is improving but continues to have some neck pain which is also starting to improve. Exam is normal, we will treat him conservatively, diclofenac , x-rays, home physical therapy, return to see me as needed in 6 weeks.  Coughing Alen also has been having a cough for the last week, nonproductive and starting to improve. On exam he is speaking full sentences, no accessory muscle use, does not appear in respiratory distress. He does have coarse lung sounds bilaterally left worse than right with prolonged expiratory phase. I would like chest x-ray, and we will add albuterol . As he is starting to improve we will hold off on aggressive steroid or antibiotic treatment.    ____________________________________________ Debby PARAS. Curtis, M.D., ABFM., CAQSM., AME. Primary Care and Sports Medicine [facility]  Adjunct Professor of [facility] Medicine  [employer]  Restaurant Manager, [employer]

## 2023-04-06 ENCOUNTER — Ambulatory Visit: Payer: Medicare Other | Admitting: Podiatry

## 2023-04-09 ENCOUNTER — Encounter (INDEPENDENT_AMBULATORY_CARE_PROVIDER_SITE_OTHER): Payer: Medicare Other | Admitting: Ophthalmology

## 2023-04-18 ENCOUNTER — Encounter (INDEPENDENT_AMBULATORY_CARE_PROVIDER_SITE_OTHER): Payer: Medicare Other | Admitting: Ophthalmology

## 2023-04-18 DIAGNOSIS — H35033 Hypertensive retinopathy, bilateral: Secondary | ICD-10-CM

## 2023-04-18 DIAGNOSIS — Z794 Long term (current) use of insulin: Secondary | ICD-10-CM | POA: Diagnosis not present

## 2023-04-18 DIAGNOSIS — I10 Essential (primary) hypertension: Secondary | ICD-10-CM | POA: Diagnosis not present

## 2023-04-18 DIAGNOSIS — E113513 Type 2 diabetes mellitus with proliferative diabetic retinopathy with macular edema, bilateral: Secondary | ICD-10-CM | POA: Diagnosis not present

## 2023-04-18 DIAGNOSIS — H43811 Vitreous degeneration, right eye: Secondary | ICD-10-CM

## 2023-05-03 NOTE — Progress Notes (Signed)
 HPI: FU CAD; previously followed by Dr Eldridge Dace and transitioning to me. Had CABG 2008.  Echocardiogram 2014 showed normal LV function and mild mitral regurgitation.  Nuclear study October 2020 showed ejection fraction 60%, prior infarct in the mid to apical inferolateral wall with peri-infarct ischemia felt to be low risk and treated medically.  ETT 10/22 9:23; no ischemia. Carotid dopplers 9/24 showed 1-39 right and 40-59 left stenosis.  Since last seen patient denies dyspnea, chest pain, palpitations or syncope.  He has occasions when his blood pressure is low after exercising in the morning but is elevated in the evening.  Current Outpatient Medications  Medication Sig Dispense Refill   Aflibercept (EYLEA IO) Inject into the eye.     albuterol (VENTOLIN HFA) 108 (90 Base) MCG/ACT inhaler Inhale 2 puffs into the lungs every 6 (six) hours as needed for wheezing. 2 each 11   brimonidine (ALPHAGAN) 0.15 % ophthalmic solution Place 1 drop into both eyes 2 (two) times daily.  11   ciprofloxacin (CILOXAN) 0.3 % ophthalmic solution Place 1 drop into both eyes 4 (four) times daily as needed.     clopidogrel (PLAVIX) 75 MG tablet Take 1 tablet (75 mg total) by mouth daily with breakfast. 90 tablet 3   diclofenac (VOLTAREN) 75 MG EC tablet Take 1 tablet (75 mg total) by mouth 2 (two) times daily. 60 tablet 3   dorzolamide-timolol (COSOPT) 22.3-6.8 MG/ML ophthalmic solution Place 1 drop into the right eye 2 (two) times daily.     famotidine (PEPCID) 20 MG tablet Take 20 mg by mouth daily as needed for heartburn or indigestion.     gabapentin (NEURONTIN) 800 MG tablet Take 1 tablet (800 mg total) by mouth at bedtime. 90 tablet 3   Inclisiran Sodium ([name] [place]) Inject into the skin. Managed by Dr. Eldridge Dace     Insulin Aspart, w/Niacinamide, (FIASP) 100 UNIT/ML SOLN Inject as directed. Basal rate 1unit/hr in pump     Insulin Human (INSULIN PUMP) 100 unit/ml SOLN Inject into the skin. Use with insulin as  directed     lisinopril (ZESTRIL) 10 MG tablet Take 10 mg by mouth daily.     loratadine (CLARITIN) 10 MG tablet Take 10 mg by mouth daily as needed for allergies.     Multiple Vitamin (MULTIVITAMIN WITH MINERALS) TABS tablet Take 1 tablet by mouth 2 (two) times a week.      rosuvastatin (CRESTOR) 40 MG tablet Take 1 tablet (40 mg total) by mouth at bedtime. 90 tablet 3   No current facility-administered medications for this visit.     Past Medical History:  Diagnosis Date   Arthritis    Cancer (HCC)    kidney cancer-both kidneys remain   Chronic kidney disease    Robotic Kidney surgery for kidney cancer tumor-kidney remains-   Coronary artery disease    Diabetes mellitus (HCC)    Insulin pump -[name], and has a glucose transmitter attached.   Diabetic retinopathy (HCC)    left eye   Hyperlipidemia    Hypertension    Sciatica    Vitreous hemorrhage (HCC)    left eye    Past Surgical History:  Procedure Laterality Date   CATARACT EXTRACTION, BILATERAL Bilateral    11'15   CORONARY ARTERY BYPASS GRAFT  01/2007   x4 vessels([place])   DIAGNOSTIC LAPAROSCOPY     EYE SURGERY     retina peele   HERNIA REPAIR     KIDNEY SURGERY  tumor removed   PARS PLANA VITRECTOMY 27 GAUGE Left 12/12/2016   Procedure: PARS PLANA VITRECTOMY 27 GAUGE , ENDOLASER GAS/ FLUID EXCHAGNE;  Surgeon: Sherrie George, MD;  Location: [facility] OR;  Service: Ophthalmology;  Laterality: Left;   VASECTOMY      Social History   Socioeconomic History   Marital status: Married    Spouse name: Bryse Blanchette   Number of children: 3   Years of education: 14   Highest education level: Associate degree: academic program  Occupational History   Occupation: Retired  Tobacco Use   Smoking status: Never   Smokeless tobacco: Never  Vaping Use   Vaping status: Never Used  Substance and Sexual Activity   Alcohol use: No   Drug use: No   Sexual activity: Yes    Birth control/protection: None  Other Topics  Concern   Not on file  Social History Narrative   Lives with his wife. Exercise bowl 4-5 times a week. Treadmill once a week.    Social Drivers of Corporate investment banker Strain: Low Risk  (11/13/2022)   Overall Financial Resource Strain (CARDIA)    Difficulty of Paying Living Expenses: Not hard at all  Food Insecurity: No Food Insecurity (11/13/2022)   Hunger Vital Sign    Worried About Running Out of Food in the Last Year: Never true    Ran Out of Food in the Last Year: Never true  Transportation Needs: No Transportation Needs (11/13/2022)   PRAPARE - Administrator, Civil Service (Medical): No    Lack of Transportation (Non-Medical): No  Physical Activity: Sufficiently Active (11/13/2022)   Exercise Vital Sign    Days of Exercise per Week: 5 days    Minutes of Exercise per Session: 120 min  Stress: No Stress Concern Present (11/13/2022)   [employer] of Occupational Health - Occupational Stress Questionnaire    Feeling of Stress : Not at all  Social Connections: Moderately Integrated (11/13/2022)   Social Connection and Isolation Panel [NHANES]    Frequency of Communication with Friends and Family: More than three times a week    Frequency of Social Gatherings with Friends and Family: More than three times a week    Attends Religious Services: Never    Database administrator or Organizations: Yes    Attends Engineer, structural: More than 4 times per year    Marital Status: Married  Catering manager Violence: Not At Risk (11/13/2022)   Humiliation, Afraid, Rape, and Kick questionnaire    Fear of Current or Ex-Partner: No    Emotionally Abused: No    Physically Abused: No    Sexually Abused: No    Family History  Problem Relation Age of Onset   Heart attack Father    Heart attack Sister    Diabetes Sister    Healthy Brother    Healthy Half-Brother    Healthy Half-Brother    Healthy Half-Brother    Healthy Half-Sister    Healthy Half-Sister      ROS: no fevers or chills, productive cough, hemoptysis, dysphasia, odynophagia, melena, hematochezia, dysuria, hematuria, rash, seizure activity, orthopnea, PND, pedal edema, claudication. Remaining systems are negative.  Physical Exam: Well-developed well-nourished in no acute distress.  Skin is warm and dry.  HEENT is normal.  Neck is supple.  Chest is clear to auscultation with normal expansion.  Cardiovascular exam is regular rate and rhythm.  Abdominal exam nontender or distended. No masses palpated. Extremities show  no edema. neuro grossly intact  EKG Interpretation Date/Time:  Wednesday May 16 2023 16:03:30 EST Ventricular Rate:  64 PR Interval:  170 QRS Duration:  86 QT Interval:  394 QTC Calculation: 406 R Axis:   79  Text Interpretation: Normal sinus rhythm Inferior infarct , age undetermined Confirmed by Olga Millers ([id number]) on 05/16/2023 4:04:54 PM    A/P  1 coronary artery disease status post coronary bypass and graft-patient denies chest pain.  Continue plavix and statin.  2 carotid artery disease-plan follow-up carotid Doppler September 2025.  3 hyperlipidemia-patient has had some difficulties with inclisiran.  He is not taking this medication.  Will continue Crestor and resume Zetia 10 mg daily which she was on previously.  Check lipids and liver in 8 weeks.  Goal LDL less than 55.  4 hypertension-blood pressure is elevated.  He has occasions after exercising in the morning when it is low.  I have increased his lisinopril to 20 mg daily.  Check potassium and renal function 1 week.  Follow blood pressure and adjust regimen as needed.  Olga Millers, MD

## 2023-05-11 ENCOUNTER — Encounter: Payer: Self-pay | Admitting: Podiatry

## 2023-05-11 ENCOUNTER — Ambulatory Visit (INDEPENDENT_AMBULATORY_CARE_PROVIDER_SITE_OTHER): Payer: Medicare Other | Admitting: Podiatry

## 2023-05-11 DIAGNOSIS — E1042 Type 1 diabetes mellitus with diabetic polyneuropathy: Secondary | ICD-10-CM

## 2023-05-11 DIAGNOSIS — Q828 Other specified congenital malformations of skin: Secondary | ICD-10-CM

## 2023-05-11 NOTE — Progress Notes (Signed)
  Subjective:  Patient ID: Jeremy Johnston, male    DOB: 07-27-1950,   MRN: [id number]  No chief complaint on file.   73 y.o. male presents for callus on the outside of his foot could not wait until his [facility]fc appointment. Requesting to have them trimmed today. Relates burning and tingling in their feet. Patient is diabetic and last A1c was  Lab Results  Component Value Date   HGBA1C 7.2 11/23/2021   .   PCP:  Willo Mini, NP   . Denies any other pedal complaints. Denies n/v/f/c.   Past Medical History:  Diagnosis Date   Arthritis    Cancer (HCC)    kidney cancer-both kidneys remain   Chronic kidney disease    Robotic Kidney surgery for kidney cancer tumor-kidney remains-   Coronary artery disease    Diabetes mellitus (HCC)    Insulin  pump -[name], and has a glucose transmitter attached.   Diabetic retinopathy (HCC)    left eye   Hyperlipidemia    Hypertension    Sciatica    Vitreous hemorrhage (HCC)    left eye    Objective:  Physical Exam: Vascular: DP/PT pulses 1/4 bilateral. CFT <3 seconds. Normal hair growth on digits. No edema.  Skin. No lacerations or abrasions bilateral feet. Hallux nail on left is thickened and dystrophic with subungual debris. Other nails normal in appearence but elongated. Hyperkeratotic cored lesion noted to lateral fifth metatarsal head with tenderness to palpation.  Musculoskeletal: MMT 5/5 bilateral lower extremities in DF, PF, Inversion and Eversion. Deceased ROM in DF of ankle joint.  Neurological: Sensation intact to light touch.   Assessment:   1. Porokeratosis   2. Type 1 diabetes mellitus with diabetic polyneuropathy (HCC)         Plan:  Patient was evaluated and treated and all questions answered. -Discussed and educated patient on diabetic foot care, especially with  regards to the vascular, neurological and musculoskeletal systems.  -Stressed the importance of good glycemic control and the detriment of not  controlling  glucose levels in relation to the foot. -Discussed supportive shoes at all times and checking feet regularly.  -Mechanically debrided hyperkeratotic lesion on left lateral fifth metatarsal head. He is too early for nail care and has follow-up in a few weeks.  -Answered all patient questions -Patient to return  in 3 weeks for at risk foot care -Patient advised to call the office if any problems or questions arise in the meantime.   Jeremy Johnston, DPM

## 2023-05-16 ENCOUNTER — Encounter: Payer: Self-pay | Admitting: Cardiology

## 2023-05-16 ENCOUNTER — Telehealth: Payer: Self-pay

## 2023-05-16 ENCOUNTER — Ambulatory Visit (INDEPENDENT_AMBULATORY_CARE_PROVIDER_SITE_OTHER): Payer: Medicare Other | Admitting: Cardiology

## 2023-05-16 VITALS — BP 142/80 | HR 64 | Ht 67.0 in | Wt 164.0 lb

## 2023-05-16 DIAGNOSIS — I10 Essential (primary) hypertension: Secondary | ICD-10-CM

## 2023-05-16 DIAGNOSIS — E782 Mixed hyperlipidemia: Secondary | ICD-10-CM

## 2023-05-16 DIAGNOSIS — I6523 Occlusion and stenosis of bilateral carotid arteries: Secondary | ICD-10-CM

## 2023-05-16 MED ORDER — LISINOPRIL 20 MG PO TABS
20.0000 mg | ORAL_TABLET | Freq: Every day | ORAL | 3 refills | Status: DC
Start: 2023-05-16 — End: 2023-11-14

## 2023-05-16 MED ORDER — EZETIMIBE 10 MG PO TABS
10.0000 mg | ORAL_TABLET | Freq: Every day | ORAL | 3 refills | Status: AC
Start: 2023-05-16 — End: ?

## 2023-05-16 NOTE — Telephone Encounter (Signed)
Auth Submission: NO AUTH NEEDED Payer: medicare [employer] [place] supp Medication & CPT/J Code(s) submitted: [name] (Inclisiran) J1306 Route of submission (phone, fax, portal):  Phone # Fax # Auth type: Buy/Bill Units/visits requested: 284mg  x 2 doses Reference number:  Approval from: 06/09/22 to 05/03/24

## 2023-05-16 NOTE — Patient Instructions (Signed)
Medication Instructions:   INCREASE LISINOPRIL TO 20 MG ONCE DAILY=2 OF THE 10 MG TABLETS ONCE DAILY  START EZETIMIBE 10 MG ONCE DAILY  *If you need a refill on your cardiac medications before your next appointment, please call your pharmacy*   Lab Work:  Your physician recommends that you return for lab work in: ONE WEEK-DO NOT NEED TO FAST  Your physician recommends that you return for lab work in: 8 [facility]  If you have labs (blood work) drawn today and your tests are completely normal, you will receive your results only by: MyChart Message (if you have MyChart) OR A paper copy in the mail If you have any lab test that is abnormal or we need to change your treatment, we will call you to review the results.   Follow-Up: At [facility] [place], you and your health needs are our priority.  As part of our continuing mission to provide you with exceptional heart care, we have created designated Provider Care Teams.  These Care Teams include your primary Cardiologist (physician) and Advanced Practice Providers (APPs -  Physician Assistants and Nurse Practitioners) who all work together to provide you with the care you need, when you need it.    Your next appointment:   6 month(s)  Provider:   Olga Millers, MD

## 2023-05-17 ENCOUNTER — Ambulatory Visit (INDEPENDENT_AMBULATORY_CARE_PROVIDER_SITE_OTHER): Payer: Medicare Other | Admitting: Sports Medicine

## 2023-05-17 ENCOUNTER — Encounter: Payer: Self-pay | Admitting: Sports Medicine

## 2023-05-17 DIAGNOSIS — M65331 Trigger finger, right middle finger: Secondary | ICD-10-CM | POA: Diagnosis not present

## 2023-05-17 DIAGNOSIS — M47812 Spondylosis without myelopathy or radiculopathy, cervical region: Secondary | ICD-10-CM | POA: Diagnosis not present

## 2023-05-17 NOTE — Assessment & Plan Note (Signed)
This is a very pleasant 73 year old male, we have been treating him longitudinally for right-sided neck pain, he has responded dramatically well to home physical therapy, he can return to see me for this as needed.

## 2023-05-17 NOTE — Progress Notes (Signed)
    Procedures performed today:    None.  Independent interpretation of notes and tests performed by another provider:   None.  Brief History, Exam, Impression, and Recommendations:    Cervical spondylosis This is a very pleasant 73 year old male, we have been treating him longitudinally for right-sided neck pain, he has responded dramatically well to home physical therapy, he can return to see me for this as needed.  Trigger finger, right middle finger Jeremy Johnston does a lot of bowling, he has developed triggering right middle finger, exam for the most part benign. We will start conservatively, we also discussed the anatomy and pathophysiology, adding some home conditioning, if not better in 6 weeks we will do a third flexor sheath injection.    ____________________________________________ Ihor Austin. Benjamin Stain, M.D., ABFM., CAQSM., AME. Primary Care and Sports Medicine [facility] [facility]  Adjunct Professor of Family Medicine  [place] [employer] [facility] [employer]  Restaurant manager, fast food

## 2023-05-17 NOTE — Assessment & Plan Note (Signed)
Jeremy Johnston does a lot of bowling, he has developed triggering right middle finger, exam for the most part benign. We will start conservatively, we also discussed the anatomy and pathophysiology, adding some home conditioning, if not better in 6 weeks we will do a third flexor sheath injection.

## 2023-05-25 ENCOUNTER — Encounter: Payer: Self-pay | Admitting: Podiatry

## 2023-05-25 ENCOUNTER — Ambulatory Visit: Payer: Medicare Other | Admitting: Podiatry

## 2023-05-25 DIAGNOSIS — M79675 Pain in left toe(s): Secondary | ICD-10-CM

## 2023-05-25 DIAGNOSIS — B351 Tinea unguium: Secondary | ICD-10-CM

## 2023-05-25 DIAGNOSIS — M79674 Pain in right toe(s): Secondary | ICD-10-CM

## 2023-05-25 DIAGNOSIS — E1042 Type 1 diabetes mellitus with diabetic polyneuropathy: Secondary | ICD-10-CM

## 2023-05-25 NOTE — Progress Notes (Signed)
  Subjective:  Patient ID: Jeremy Johnston, male    DOB: [birth year],   MRN: [id number]  No chief complaint on file.   73 y.o. male presents for concern of thickened elongated and painful nails that are difficult to trim. Requesting to have them trimmed today. Relates burning and tingling in their feet. Patient is diabetic and last A1c was  Lab Results  Component Value Date   HGBA1C 7.2 11/23/2021   .   PCP:  Christen Butter, NP     Past Medical History:  Diagnosis Date   Arthritis    Cancer (HCC)    kidney cancer-both kidneys remain   Chronic kidney disease    Robotic Kidney surgery for kidney cancer tumor-kidney remains-   Coronary artery disease    Diabetes mellitus (HCC)    Insulin pump -[name], and has a glucose transmitter attached.   Diabetic retinopathy (HCC)    left eye   Hyperlipidemia    Hypertension    Sciatica    Vitreous hemorrhage (HCC)    left eye    Objective:  Physical Exam: Vascular: DP/PT pulses 1/4 bilateral. CFT <3 seconds. Normal hair growth on digits. No edema.  Skin. No lacerations or abrasions bilateral feet. Hallux nail on left is thickened and dystrophic with subungual debris. Other nails normal in appearence but elongated. Hyperkeratotic cored lesion noted to lateral fifth metatarsal head with tenderness to palpation.  Musculoskeletal: MMT 5/5 bilateral lower extremities in DF, PF, Inversion and Eversion. Deceased ROM in DF of ankle joint.  Neurological: Sensation intact to light touch.   Assessment:   1. Pain due to onychomycosis of toenails of both feet   2. Type 1 diabetes mellitus with diabetic polyneuropathy (HCC)          Plan:  Patient was evaluated and treated and all questions answered. -Discussed and educated patient on diabetic foot care, especially with  regards to the vascular, neurological and musculoskeletal systems.  -Stressed the importance of good glycemic control and the detriment of not  controlling glucose levels in  relation to the foot. -Discussed supportive shoes at all times and checking feet regularly.  -Mechanically debrided nails 1-5 bilateral with nail nipper without incident.  -Answered all patient questions -Patient to return  in 3 weeks for at risk foot care -Patient advised to call the office if any problems or questions arise in the meantime.   Louann Sjogren, DPM

## 2023-05-26 LAB — BASIC METABOLIC PANEL
BUN/Creatinine Ratio: 17 (ref 10–24)
BUN: 19 mg/dL (ref 8–27)
CO2: 23 mmol/L (ref 20–29)
Calcium: 9.1 mg/dL (ref 8.6–10.2)
Chloride: 106 mmol/L (ref 96–106)
Creatinine, Ser: 1.13 mg/dL (ref 0.76–1.27)
Glucose: 92 mg/dL (ref 70–99)
Potassium: 4.8 mmol/L (ref 3.5–5.2)
Sodium: 141 mmol/L (ref 134–144)
eGFR: 69 mL/min/{1.73_m2} (ref >59)

## 2023-05-28 ENCOUNTER — Encounter (INDEPENDENT_AMBULATORY_CARE_PROVIDER_SITE_OTHER): Payer: Medicare Other | Admitting: Ophthalmology

## 2023-05-30 ENCOUNTER — Encounter (INDEPENDENT_AMBULATORY_CARE_PROVIDER_SITE_OTHER): Payer: Medicare Other | Admitting: Ophthalmology

## 2023-05-30 DIAGNOSIS — E113513 Type 2 diabetes mellitus with proliferative diabetic retinopathy with macular edema, bilateral: Secondary | ICD-10-CM

## 2023-05-30 DIAGNOSIS — Z794 Long term (current) use of insulin: Secondary | ICD-10-CM

## 2023-05-30 DIAGNOSIS — H35033 Hypertensive retinopathy, bilateral: Secondary | ICD-10-CM

## 2023-05-30 DIAGNOSIS — I10 Essential (primary) hypertension: Secondary | ICD-10-CM

## 2023-05-30 DIAGNOSIS — H43811 Vitreous degeneration, right eye: Secondary | ICD-10-CM

## 2023-06-17 ENCOUNTER — Other Ambulatory Visit: Payer: Self-pay | Admitting: Interventional Cardiology

## 2023-07-13 LAB — LIPID PANEL
Chol/HDL Ratio: 3.1 ratio (ref 0.0–5.0)
Cholesterol, Total: 93 mg/dL — ABNORMAL LOW (ref 100–199)
HDL: 30 mg/dL — ABNORMAL LOW (ref >39)
LDL Chol Calc (NIH): 52 mg/dL (ref 0–99)
Triglycerides: 42 mg/dL (ref 0–149)
VLDL Cholesterol Cal: 11 mg/dL (ref 5–40)

## 2023-07-13 LAB — HEPATIC FUNCTION PANEL
ALT: 24 IU/L (ref 0–44)
AST: 22 IU/L (ref 0–40)
Albumin: 3.9 g/dL (ref 3.8–4.8)
Alkaline Phosphatase: 88 IU/L (ref 44–121)
Bilirubin Total: 0.4 mg/dL (ref 0.0–1.2)
Bilirubin, Direct: 0.15 mg/dL (ref 0.00–0.40)
Total Protein: 6.3 g/dL (ref 6.0–8.5)

## 2023-07-16 ENCOUNTER — Encounter: Payer: Self-pay | Admitting: *Deleted

## 2023-07-23 ENCOUNTER — Encounter (INDEPENDENT_AMBULATORY_CARE_PROVIDER_SITE_OTHER): Payer: Medicare Other | Admitting: Ophthalmology

## 2023-07-23 DIAGNOSIS — H35033 Hypertensive retinopathy, bilateral: Secondary | ICD-10-CM | POA: Diagnosis not present

## 2023-07-23 DIAGNOSIS — I10 Essential (primary) hypertension: Secondary | ICD-10-CM | POA: Diagnosis not present

## 2023-07-23 DIAGNOSIS — E113513 Type 2 diabetes mellitus with proliferative diabetic retinopathy with macular edema, bilateral: Secondary | ICD-10-CM

## 2023-07-23 DIAGNOSIS — Z794 Long term (current) use of insulin: Secondary | ICD-10-CM | POA: Diagnosis not present

## 2023-07-23 DIAGNOSIS — H43811 Vitreous degeneration, right eye: Secondary | ICD-10-CM

## 2023-08-30 ENCOUNTER — Ambulatory Visit (INDEPENDENT_AMBULATORY_CARE_PROVIDER_SITE_OTHER): Payer: Medicare Other | Admitting: Podiatry

## 2023-08-30 ENCOUNTER — Encounter: Payer: Self-pay | Admitting: Podiatry

## 2023-08-30 DIAGNOSIS — B351 Tinea unguium: Secondary | ICD-10-CM

## 2023-08-30 DIAGNOSIS — M79675 Pain in left toe(s): Secondary | ICD-10-CM

## 2023-08-30 DIAGNOSIS — E1042 Type 1 diabetes mellitus with diabetic polyneuropathy: Secondary | ICD-10-CM | POA: Diagnosis not present

## 2023-08-30 DIAGNOSIS — M79674 Pain in right toe(s): Secondary | ICD-10-CM

## 2023-08-30 NOTE — Progress Notes (Signed)
  Subjective:  Patient ID: Jeremy Johnston, male    DOB: [birth year],   MRN: [id number]  Chief Complaint  Patient presents with   Diabetes    73 y.o. male presents for concern of thickened elongated and painful nails that are difficult to trim. Requesting to have them trimmed today. Relates burning and tingling in their feet. Patient is diabetic and last A1c was  Lab Results  Component Value Date   HGBA1C 7.2 11/23/2021   .   PCP:  Cherre Cornish, NP     Past Medical History:  Diagnosis Date   Arthritis    Cancer (HCC)    kidney cancer-both kidneys remain   Chronic kidney disease    Robotic Kidney surgery for kidney cancer tumor-kidney remains-   Coronary artery disease    Diabetes mellitus (HCC)    Insulin  pump -[name], and has a glucose transmitter attached.   Diabetic retinopathy (HCC)    left eye   Hyperlipidemia    Hypertension    Sciatica    Vitreous hemorrhage (HCC)    left eye    Objective:  Physical Exam: Vascular: DP/PT pulses 1/4 bilateral. CFT <3 seconds. Normal hair growth on digits. No edema.  Skin. No lacerations or abrasions bilateral feet. Hallux nail on left is thickened and dystrophic with subungual debris. Other nails normal in appearence but elongated. Hyperkeratotic cored lesion noted to lateral fifth metatarsal head with tenderness to palpation.  Musculoskeletal: MMT 5/5 bilateral lower extremities in DF, PF, Inversion and Eversion. Deceased ROM in DF of ankle joint.  Neurological: Sensation intact to light touch.   Assessment:   1. Pain due to onychomycosis of toenails of both feet   2. Type 1 diabetes mellitus with diabetic polyneuropathy (HCC)           Plan:  Patient was evaluated and treated and all questions answered. -Discussed and educated patient on diabetic foot care, especially with  regards to the vascular, neurological and musculoskeletal systems.  -Stressed the importance of good glycemic control and the detriment of not   controlling glucose levels in relation to the foot. -Discussed supportive shoes at all times and checking feet regularly.  -Mechanically debrided nails 1-5 bilateral with nail nipper without incident.  -Answered all patient questions -Patient to return  in 3 weeks for at risk foot care -Patient advised to call the office if any problems or questions arise in the meantime.   Jennefer Moats, DPM

## 2023-09-10 ENCOUNTER — Other Ambulatory Visit: Payer: Self-pay | Admitting: Sports Medicine

## 2023-09-10 DIAGNOSIS — M5416 Radiculopathy, lumbar region: Secondary | ICD-10-CM

## 2023-09-17 ENCOUNTER — Encounter (INDEPENDENT_AMBULATORY_CARE_PROVIDER_SITE_OTHER): Admitting: Ophthalmology

## 2023-09-17 DIAGNOSIS — Z794 Long term (current) use of insulin: Secondary | ICD-10-CM | POA: Diagnosis not present

## 2023-09-17 DIAGNOSIS — I10 Essential (primary) hypertension: Secondary | ICD-10-CM | POA: Diagnosis not present

## 2023-09-17 DIAGNOSIS — E113513 Type 2 diabetes mellitus with proliferative diabetic retinopathy with macular edema, bilateral: Secondary | ICD-10-CM

## 2023-09-17 DIAGNOSIS — H43811 Vitreous degeneration, right eye: Secondary | ICD-10-CM

## 2023-09-17 DIAGNOSIS — H35033 Hypertensive retinopathy, bilateral: Secondary | ICD-10-CM

## 2023-10-01 ENCOUNTER — Ambulatory Visit

## 2023-10-01 ENCOUNTER — Encounter: Payer: Self-pay | Admitting: Sports Medicine

## 2023-10-01 ENCOUNTER — Ambulatory Visit (INDEPENDENT_AMBULATORY_CARE_PROVIDER_SITE_OTHER): Admitting: Sports Medicine

## 2023-10-01 DIAGNOSIS — M5416 Radiculopathy, lumbar region: Secondary | ICD-10-CM

## 2023-10-01 NOTE — Assessment & Plan Note (Signed)
 Very pleasant 73 year old male, known lumbar radiculitis, he has done well historically with gabapentin  1 capsule daily, occasional burst of prednisone , he recalls a misstep, and a near fall, he then had some pain right flank. Nothing radicular. He is overall improving over time. He is not hurting enough to medication. He will hold off on bowling for 3 weeks, do his home conditioning and return to see me 3 weeks as needed.

## 2023-10-01 NOTE — Progress Notes (Signed)
    Procedures performed today:    None.  Independent interpretation of notes and tests performed by another provider:   None.  Brief History, Exam, Impression, and Recommendations:    Right lumbar radiculitis Very pleasant 73 year old male, known lumbar radiculitis, he has done well historically with gabapentin  1 capsule daily, occasional burst of prednisone , he recalls a misstep, and a near fall, he then had some pain right flank. Nothing radicular. He is overall improving over time. He is not hurting enough to medication. He will hold off on bowling for 3 weeks, do his home conditioning and return to see me 3 weeks as needed.    ____________________________________________ Debby PARAS. Curtis, M.D., ABFM., CAQSM., AME. Primary Care and Sports Medicine [facility]  Adjunct Professor of [facility] Medicine  [employer]  Restaurant manager, fast food

## 2023-10-07 ENCOUNTER — Ambulatory Visit: Payer: Self-pay | Admitting: Sports Medicine

## 2023-10-22 ENCOUNTER — Ambulatory Visit (INDEPENDENT_AMBULATORY_CARE_PROVIDER_SITE_OTHER): Admitting: Sports Medicine

## 2023-10-22 ENCOUNTER — Encounter: Payer: Self-pay | Admitting: Sports Medicine

## 2023-10-22 DIAGNOSIS — M5416 Radiculopathy, lumbar region: Secondary | ICD-10-CM

## 2023-10-22 NOTE — Assessment & Plan Note (Signed)
 This pleasant 73 year old male returns, known lumbar radiculitis, he has historically done well with 1 gabapentin  per day, occasional burst of prednisone . At the last visit he had taken a misstep, nearly fell, developed pain right flank, nothing radicular, he was improving, we added home physical therapy, and he returns today doing significantly better, has a bit of a pull right flank but overall nothing that affects his function, I think this was more of a lumbar strain, he is going to discontinue gabapentin  and see how things are going, he can do acetaminophen  as needed and return to see me as needed.

## 2023-10-22 NOTE — Progress Notes (Signed)
    Procedures performed today:    None.  Independent interpretation of notes and tests performed by another provider:   None.  Brief History, Exam, Impression, and Recommendations:    Right lumbar radiculitis This pleasant 73 year old male returns, known lumbar radiculitis, he has historically done well with 1 gabapentin  per day, occasional burst of prednisone . At the last visit he had taken a misstep, nearly fell, developed pain right flank, nothing radicular, he was improving, we added home physical therapy, and he returns today doing significantly better, has a bit of a pull right flank but overall nothing that affects his function, I think this was more of a lumbar strain, he is going to discontinue gabapentin  and see how things are going, he can do acetaminophen  as needed and return to see me as needed.    ____________________________________________ Debby PARAS. Curtis, M.D., ABFM., CAQSM., AME. Primary Care and Sports Medicine [facility]  Adjunct Professor of [facility] Medicine  [employer]  Restaurant manager, fast food

## 2023-10-31 NOTE — Progress Notes (Signed)
 HPI: FU CAD. Had CABG 2008.  Echocardiogram 2014 showed normal LV function and mild mitral regurgitation.  Nuclear study October 2020 showed ejection fraction 60%, prior infarct in the mid to apical inferolateral wall with peri-infarct ischemia felt to be low risk and treated medically.  ETT 10/22 9:23; no ischemia. Carotid dopplers 9/24 showed 1-39 right and 40-59 left stenosis.  Since last seen he denies dyspnea, chest pain, palpitations or syncope.  He does complain of bilateral calf pain with ambulation relieved with rest.  Current Outpatient Medications  Medication Sig Dispense Refill   Aflibercept  (EYLEA  IO) Inject into the eye.     brimonidine  (ALPHAGAN ) 0.15 % ophthalmic solution Place 1 drop into both eyes 2 (two) times daily.  11   ciprofloxacin (CILOXAN) 0.3 % ophthalmic solution Place 1 drop into both eyes 4 (four) times daily as needed.     clopidogrel  (PLAVIX ) 75 MG tablet Take 1 tablet (75 mg total) by mouth daily with breakfast. 90 tablet 3   dorzolamide -timolol  (COSOPT ) 22.3-6.8 MG/ML ophthalmic solution Place 1 drop into the right eye 2 (two) times daily.     ezetimibe  (ZETIA ) 10 MG tablet Take 1 tablet (10 mg total) by mouth daily. 90 tablet 3   famotidine  (PEPCID ) 20 MG tablet Take 20 mg by mouth daily as needed for heartburn or indigestion.     Insulin  Human (INSULIN  PUMP) 100 unit/ml SOLN Inject into the skin. Use with insulin  as directed     lisinopril  (ZESTRIL ) 20 MG tablet Take 1 tablet (20 mg total) by mouth daily. 90 tablet 3   loratadine  (CLARITIN ) 10 MG tablet Take 10 mg by mouth daily as needed for allergies.     Multiple Vitamin (MULTIVITAMIN WITH MINERALS) TABS tablet Take 1 tablet by mouth 2 (two) times a week.      rosuvastatin  (CRESTOR ) 40 MG tablet TAKE 1 TABLET BY MOUTH AT BEDTIME 90 tablet 3   No current facility-administered medications for this visit.     Past Medical History:  Diagnosis Date   Arthritis    Cancer (HCC)    kidney cancer-both  kidneys remain   Chronic kidney disease    Robotic Kidney surgery for kidney cancer tumor-kidney remains-   Coronary artery disease    Diabetes mellitus (HCC)    Insulin  pump -[name], and has a glucose transmitter attached.   Diabetic retinopathy (HCC)    left eye   Hyperlipidemia    Hypertension    Sciatica    Vitreous hemorrhage (HCC)    left eye    Past Surgical History:  Procedure Laterality Date   CATARACT EXTRACTION, BILATERAL Bilateral    11'15   CORONARY ARTERY BYPASS GRAFT  01/2007   x4 vessels([place] )   DIAGNOSTIC LAPAROSCOPY     EYE SURGERY     retina peele   HERNIA REPAIR     KIDNEY SURGERY     tumor removed   PARS PLANA VITRECTOMY 27 GAUGE Left 12/12/2016   Procedure: PARS PLANA VITRECTOMY 27 GAUGE , ENDOLASER GAS/ FLUID EXCHAGNE;  Surgeon: Alvia Norleen BIRCH, MD;  Location: [facility] OR;  Service: Ophthalmology;  Laterality: Left;   VASECTOMY      Social History   Socioeconomic History   Marital status: Married    Spouse name: Merl Bommarito   Number of children: 3   Years of education: 14   Highest education level: Associate degree: academic program  Occupational History   Occupation: Retired  Tobacco Use   Smoking status:  Never   Smokeless tobacco: Never  Vaping Use   Vaping status: Never Used  Substance and Sexual Activity   Alcohol use: No   Drug use: No   Sexual activity: Yes    Birth control/protection: None  Other Topics Concern   Not on file  Social History Narrative   Lives with his wife. Exercise bowl 4-5 times a week. Treadmill once a week.    Social Drivers of Corporate investment banker Strain: Low Risk  (11/13/2022)   Overall Financial Resource Strain (CARDIA)    Difficulty of Paying Living Expenses: Not hard at all  Food Insecurity: No Food Insecurity (11/13/2022)   Hunger Vital Sign    Worried About Running Out of Food in the Last Year: Never true    Ran Out of Food in the Last Year: Never true  Transportation Needs: No Transportation  Needs (11/13/2022)   PRAPARE - Administrator, Civil Service (Medical): No    Lack of Transportation (Non-Medical): No  Physical Activity: Sufficiently Active (11/13/2022)   Exercise Vital Sign    Days of Exercise per Week: 5 days    Minutes of Exercise per Session: 120 min  Stress: No Stress Concern Present (11/13/2022)   [employer] of Occupational Health - Occupational Stress Questionnaire    Feeling of Stress : Not at all  Social Connections: Moderately Integrated (11/13/2022)   Social Connection and Isolation Panel    Frequency of Communication with Friends and Family: More than three times a week    Frequency of Social Gatherings with Friends and Family: More than three times a week    Attends Religious Services: Never    Database administrator or Organizations: Yes    Attends Engineer, structural: More than 4 times per year    Marital Status: Married  Catering manager Violence: Not At Risk (11/13/2022)   Humiliation, Afraid, Rape, and Kick questionnaire    Fear of Current or Ex-Partner: No    Emotionally Abused: No    Physically Abused: No    Sexually Abused: No    Family History  Problem Relation Age of Onset   Heart attack Father    Heart attack Sister    Diabetes Sister    Healthy Brother    Healthy Half-Brother    Healthy Half-Brother    Healthy Half-Brother    Healthy Half-Sister    Healthy Half-Sister     ROS: no fevers or chills, productive cough, hemoptysis, dysphasia, odynophagia, melena, hematochezia, dysuria, hematuria, rash, seizure activity, orthopnea, PND, pedal edema, claudication. Remaining systems are negative.  Physical Exam: Well-developed well-nourished in no acute distress.  Skin is warm and dry.  HEENT is normal.  Neck is supple.  Chest is clear to auscultation with normal expansion.  Cardiovascular exam is regular rate and rhythm.  Abdominal exam nontender or distended. No masses palpated. Extremities show no  edema. neuro grossly intact  A/P  1 coronary artery disease status post coronary bypass and graft-patient denies chest pain.  Plan to continue medical therapy with Plavix  and statin.  2 hypertension-patient's blood pressure is elevated; however he is having difficulties with labile pressures at home.  With 20 mg of lisinopril  his systolic was 80s associated with dizziness.  With 10 mg it is not controlled.  Will try 15 mg daily and follow.  3 carotid artery disease-patient will need follow-up carotid Doppler September 2025.  4 hyperlipidemia-continue Crestor  and Zetia .  5 claudication-patient may have bilateral claudication  by description.  Will schedule ABIs with Doppler.  Redell Shallow, MD

## 2023-11-12 ENCOUNTER — Encounter (INDEPENDENT_AMBULATORY_CARE_PROVIDER_SITE_OTHER): Admitting: Ophthalmology

## 2023-11-13 ENCOUNTER — Encounter (INDEPENDENT_AMBULATORY_CARE_PROVIDER_SITE_OTHER): Admitting: Ophthalmology

## 2023-11-13 DIAGNOSIS — E113513 Type 2 diabetes mellitus with proliferative diabetic retinopathy with macular edema, bilateral: Secondary | ICD-10-CM

## 2023-11-13 DIAGNOSIS — H35033 Hypertensive retinopathy, bilateral: Secondary | ICD-10-CM

## 2023-11-13 DIAGNOSIS — H43811 Vitreous degeneration, right eye: Secondary | ICD-10-CM

## 2023-11-13 DIAGNOSIS — Z794 Long term (current) use of insulin: Secondary | ICD-10-CM

## 2023-11-13 DIAGNOSIS — I10 Essential (primary) hypertension: Secondary | ICD-10-CM | POA: Diagnosis not present

## 2023-11-13 DIAGNOSIS — H35371 Puckering of macula, right eye: Secondary | ICD-10-CM

## 2023-11-14 ENCOUNTER — Ambulatory Visit: Admitting: Cardiology

## 2023-11-14 ENCOUNTER — Encounter: Payer: Self-pay | Admitting: Cardiology

## 2023-11-14 VITALS — BP 152/73 | HR 97 | Ht 67.0 in | Wt 154.0 lb

## 2023-11-14 DIAGNOSIS — I6523 Occlusion and stenosis of bilateral carotid arteries: Secondary | ICD-10-CM | POA: Diagnosis not present

## 2023-11-14 DIAGNOSIS — I25118 Atherosclerotic heart disease of native coronary artery with other forms of angina pectoris: Secondary | ICD-10-CM

## 2023-11-14 DIAGNOSIS — I10 Essential (primary) hypertension: Secondary | ICD-10-CM

## 2023-11-14 DIAGNOSIS — I739 Peripheral vascular disease, unspecified: Secondary | ICD-10-CM

## 2023-11-14 DIAGNOSIS — E782 Mixed hyperlipidemia: Secondary | ICD-10-CM

## 2023-11-14 MED ORDER — LISINOPRIL 5 MG PO TABS
5.0000 mg | ORAL_TABLET | Freq: Every day | ORAL | 3 refills | Status: AC
Start: 2023-11-14 — End: 2024-02-12

## 2023-11-14 MED ORDER — LISINOPRIL 10 MG PO TABS
10.0000 mg | ORAL_TABLET | Freq: Every day | ORAL | 3 refills | Status: AC
Start: 2023-11-14 — End: 2024-02-12

## 2023-11-14 NOTE — Patient Instructions (Signed)
 Medication Instructions:   CHANGE LISINOPRIL  TO 15 MG ONCE DAILY= 1 OF THE 5 MG TABLETS AND 1 OF THE 10 MG TABLETS ONCE DAILY  *If you need a refill on your cardiac medications before your next appointment, please call your pharmacy*  Testing/Procedures:  Your physician has requested that you have an ankle brachial index (ABI). During this test an ultrasound and blood pressure cuff are used to evaluate the arteries that supply the arms and legs with blood. Allow thirty minutes for this exam. There are no restrictions or special instructions.  Please note: We ask at that you not bring children with you during ultrasound (echo/ vascular) testing. Due to room size and safety concerns, children are not allowed in the ultrasound rooms during exams. Our front office staff cannot provide observation of children in our lobby area while testing is being conducted. An adult accompanying a patient to their appointment will only be allowed in the ultrasound room at the discretion of the ultrasound technician under special circumstances. We apologize for any inconvenience. [address]  Follow-Up: At [facility], you and your health needs are our priority.  As part of our continuing mission to provide you with exceptional heart care, our providers are all part of one team.  This team includes your primary Cardiologist (physician) and Advanced Practice Providers or APPs (Physician Assistants and Nurse Practitioners) who all work together to provide you with the care you need, when you need it.  Your next appointment:   6 month(s)  Provider:   Redell Shallow, MD

## 2023-11-22 ENCOUNTER — Ambulatory Visit (INDEPENDENT_AMBULATORY_CARE_PROVIDER_SITE_OTHER)

## 2023-11-22 VITALS — BP 125/64 | HR 78 | Ht 67.0 in | Wt 156.0 lb

## 2023-11-22 DIAGNOSIS — Z0000 Encounter for general adult medical examination without abnormal findings: Secondary | ICD-10-CM | POA: Diagnosis not present

## 2023-11-22 NOTE — Progress Notes (Signed)
 Subjective:   Jeremy Johnston is a 73 y.o. male who presents for Medicare Annual/Subsequent preventive examination.  Visit Complete: In person  Patient Medicare AWV questionnaire was completed by the patient on n/a; I have confirmed that all information answered by patient is correct and no changes since this date.  Cardiac Risk Factors include: advanced age (>54men, >57 women);male gender;dyslipidemia;hypertension;diabetes mellitus;family history of premature cardiovascular disease     Objective:    Today's Vitals   11/22/23 1304  BP: (!) 143/65  Pulse: 78  SpO2: 97%  Weight: 156 lb (70.8 kg)  Height: 5' 7 (1.702 m)   Body mass index is 24.43 kg/m.     11/22/2023    1:28 PM 11/13/2022    1:20 PM 08/07/2022    8:27 PM 11/07/2021    1:25 PM 08/16/2021    9:03 AM 12/12/2016   11:00 PM 07/22/2015    2:11 PM  Advanced Directives  Does Patient Have a Medical Advance Directive? Yes No No No Yes No  No   Type of Estate agent of [place];Living will    Healthcare Power of [place];Out of facility DNR (pink MOST or yellow form)    Does patient want to make changes to medical advance directive? No - Patient declined Yes (MAU/Ambulatory/Procedural Areas - Information given)   No - Patient declined    Would patient like information on creating a medical advance directive?    No - Patient declined  No - Patient declined  No - patient declined information      Data saved with a previous flowsheet row definition     Current Medications (verified) Outpatient Encounter Medications as of 11/22/2023  Medication Sig   ciprofloxacin (CILOXAN) 0.3 % ophthalmic solution Place 1 drop into both eyes 4 (four) times daily as needed.   clopidogrel  (PLAVIX ) 75 MG tablet Take 1 tablet (75 mg total) by mouth daily with breakfast.   ezetimibe  (ZETIA ) 10 MG tablet Take 1 tablet (10 mg total) by mouth daily.   famotidine  (PEPCID ) 20 MG tablet Take 20 mg by mouth daily as needed for  heartburn or indigestion.   Insulin  Human (INSULIN  PUMP) 100 unit/ml SOLN Inject into the skin. Use with insulin  as directed   lisinopril  (ZESTRIL ) 10 MG tablet Take 1 tablet (10 mg total) by mouth daily.   lisinopril  (ZESTRIL ) 5 MG tablet Take 1 tablet (5 mg total) by mouth daily.   loratadine  (CLARITIN ) 10 MG tablet Take 10 mg by mouth daily as needed for allergies.   Multiple Vitamin (MULTIVITAMIN WITH MINERALS) TABS tablet Take 1 tablet by mouth 2 (two) times a week.    rosuvastatin  (CRESTOR ) 40 MG tablet TAKE 1 TABLET BY MOUTH AT BEDTIME   Aflibercept  (EYLEA  IO) Inject into the eye. (Patient not taking: Reported on 11/22/2023)   brimonidine  (ALPHAGAN ) 0.15 % ophthalmic solution Place 1 drop into both eyes 2 (two) times daily. (Patient not taking: Reported on 11/22/2023)   dorzolamide -timolol  (COSOPT ) 22.3-6.8 MG/ML ophthalmic solution Place 1 drop into the right eye 2 (two) times daily. (Patient not taking: Reported on 11/22/2023)   No facility-administered encounter medications on file as of 11/22/2023.    Allergies (verified) Insulin  lispro and Meloxicam    History: Past Medical History:  Diagnosis Date   Arthritis    Cancer (HCC)    kidney cancer-both kidneys remain   Chronic kidney disease    Robotic Kidney surgery for kidney cancer tumor-kidney remains-   Coronary artery disease    Diabetes  mellitus (HCC)    Insulin  pump -Jeremy Johnston, and has a glucose transmitter attached.   Diabetic retinopathy (HCC)    left eye   Hyperlipidemia    Hypertension    Sciatica    Vitreous hemorrhage (HCC)    left eye   Past Surgical History:  Procedure Laterality Date   CATARACT EXTRACTION, BILATERAL Bilateral    11'15   CORONARY ARTERY BYPASS GRAFT  01/2007   x4 vessels([place] )   DIAGNOSTIC LAPAROSCOPY     EYE SURGERY     retina peele   HERNIA REPAIR     KIDNEY SURGERY     tumor removed   PARS PLANA VITRECTOMY 27 GAUGE Left 12/12/2016   Procedure: PARS PLANA VITRECTOMY 27 GAUGE ,  ENDOLASER GAS/ FLUID EXCHAGNE;  Surgeon: Jeremy Norleen BIRCH, Jeremy Johnston;  Location: [facility] OR;  Service: Ophthalmology;  Laterality: Left;   VASECTOMY     Family History  Problem Relation Age of Onset   Heart attack Father    Heart attack Sister    Diabetes Sister    Healthy Brother    Healthy Half-Brother    Healthy Half-Brother    Healthy Half-Brother    Healthy Half-Sister    Healthy Half-Sister    Social History   Socioeconomic History   Marital status: Married    Spouse name: Jeremy Johnston   Number of children: 3   Years of education: 14   Highest education level: Associate degree: academic program  Occupational History   Occupation: Retired  Tobacco Use   Smoking status: Never   Smokeless tobacco: Never  Vaping Use   Vaping status: Never Used  Substance and Sexual Activity   Alcohol use: No   Drug use: No   Sexual activity: Yes    Birth control/protection: None  Other Topics Concern   Not on file  Social History Narrative   Lives with his wife. Exercise bowl 4-5 times a week. Treadmill once a week.    Social Drivers of Corporate investment banker Strain: Low Risk  (11/13/2022)   Overall Financial Resource Strain (CARDIA)    Difficulty of Paying Living Expenses: Not hard at all  Food Insecurity: No Food Insecurity (11/22/2023)   Hunger Vital Sign    Worried About Running Out of Food in the Last Year: Never true    Ran Out of Food in the Last Year: Never true  Transportation Needs: No Transportation Needs (11/22/2023)   PRAPARE - Administrator, Civil Service (Medical): No    Lack of Transportation (Non-Medical): No  Physical Activity: Sufficiently Active (11/22/2023)   Exercise Vital Sign    Days of Exercise per Week: 7 days    Minutes of Exercise per Session: 30 min  Stress: No Stress Concern Present (11/22/2023)   Jeremy Johnston of Occupational Health - Occupational Stress Questionnaire    Feeling of Stress: Not at all  Social Connections: Socially  Integrated (11/22/2023)   Social Connection and Isolation Panel    Frequency of Communication with Friends and Family: More than three times a week    Frequency of Social Gatherings with Friends and Family: More than three times a week    Attends Religious Services: More than 4 times per year    Active Member of [employer] or Organizations: Yes    Attends Engineer, structural: More than 4 times per year    Marital Status: Married    Tobacco Counseling Counseling given: Not Answered   Clinical Intake:  Pre-visit  preparation completed: Yes  Pain : No/denies pain     BMI - recorded: 24.43 Nutritional Status: BMI of 19-24  Normal Nutritional Risks: None Diabetes: Yes CBG done?: Yes (112 mg/dl) CBG resulted in Enter/ Edit results?: No Did pt. bring in CBG monitor from home?: No  How often do you need to have someone help you when you read instructions, pamphlets, or other written materials from your doctor or pharmacy?: 1 - Never What is the last grade level you completed in school?: 14  Interpreter Needed?: No      Activities of Daily Living    11/22/2023    1:10 PM  In your present state of health, do you have any difficulty performing the following activities:  Hearing? 0  Vision? 0  Difficulty concentrating or making decisions? 0  Walking or climbing stairs? 0  Dressing or bathing? 0  Doing errands, shopping? 0  Preparing Food and eating ? N  Using the Toilet? N  In the past six months, have you accidently leaked urine? N  Do you have problems with loss of bowel control? N  Managing your Medications? N  Managing your Finances? N  Housekeeping or managing your Housekeeping? N    Patient Care Team: Jeremy Mini, Jeremy Johnston as PCP - General (Nurse Practitioner) Jeremy Redell RAMAN, Jeremy Johnston as PCP - Cardiology (Cardiology) Jeremy Norleen BIRCH, Jeremy Johnston as Consulting Physician (Ophthalmology)  Indicate any recent Medical Services you may have received from other than [facility] providers  in the past year (date may be approximate).     Assessment:   This is a routine wellness examination for Jeremy Johnston.  Hearing/Vision screen No results found.   Goals Addressed             This Visit's Progress    Patient Stated       Patient states he would like to be healed from diabetes.        Depression Screen    11/22/2023    1:26 PM 11/13/2022    1:26 PM 03/30/2022    2:08 PM 11/07/2021    1:28 PM 08/15/2021    2:16 PM 07/22/2015    2:11 PM  PHQ 2/9 Scores  PHQ - 2 Score 0 0 0 0 0 0    Fall Risk    11/22/2023    1:28 PM 11/13/2022    1:25 PM 11/07/2021    1:29 PM 08/15/2021    2:16 PM 07/22/2015    2:11 PM  Fall Risk   Falls in the past year? 1 0 0 0 No   Number falls in past yr: 0 0 0 0   Injury with Fall? 1 0 0 0   Risk for fall due to : History of fall(s) No Fall Risks No Fall Risks No Fall Risks   Follow up Falls evaluation completed Falls evaluation completed Falls evaluation completed  Falls prevention discussed;Falls evaluation completed       Data saved with a previous flowsheet row definition     MEDICARE RISK AT HOME: Medicare Risk at Home Any stairs in or around the home?: Yes If so, are there any without handrails?: No Home free of loose throw rugs in walkways, pet beds, electrical cords, etc?: No Adequate lighting in your home to reduce risk of falls?: Yes Life alert?: No Use of a cane, walker or w/c?: No Grab bars in the bathroom?: No Shower chair or bench in shower?: No Elevated toilet seat or a handicapped toilet?: No  TIMED  UP AND GO:  Was the test performed?  Yes  Length of time to ambulate 10 feet: 7 sec Gait steady and fast without use of assistive device    Cognitive Function:        11/22/2023    1:30 PM 11/13/2022    1:29 PM 11/07/2021    1:36 PM  6CIT Screen  What Year? 0 points 0 points 0 points  What month? 0 points 0 points 0 points  What time? 0 points 0 points 0 points  Count back from 20 0 points 0 points 0 points   Months in reverse 0 points 0 points 0 points  Repeat phrase 0 points 0 points 0 points  Total Score 0 points 0 points 0 points    Immunizations Immunization History  Administered Date(s) Administered   Fluad Quad(high Dose 65+) 12/22/2021   Fluzone Influenza virus vaccine,trivalent (IIV3), split virus 12/25/2012, 02/18/2014   Influenza, High Dose Seasonal PF 01/20/2019   Influenza,inj,Quad PF,6+ Mos 12/13/2015   Influenza,inj,quad, With Preservative 06/03/2015   Moderna Sars-Covid-2 Vaccination 06/16/2019, 07/14/2019   PNEUMOCOCCAL CONJUGATE-20 08/15/2021   Pneumococcal Conjugate-13 11/21/2017   Pneumococcal Polysaccharide-23 08/26/2013   Tdap 10/17/2017    TDAP status: Up to date  Flu Vaccine status: Up to date  Pneumococcal vaccine status: Up to date  Covid-19 vaccine status: Declined, Education has been provided regarding the importance of this vaccine but patient still declined. Advised may receive this vaccine at local pharmacy or Health Dept.or vaccine clinic. Aware to provide a copy of the vaccination record if obtained from local pharmacy or Health Dept. Verbalized acceptance and understanding.  Qualifies for Shingles Vaccine? Yes   Zostavax completed No   Shingrix Completed?: No.    Education has been provided regarding the importance of this vaccine. Patient has been advised to call insurance company to determine out of pocket expense if they have not yet received this vaccine. Advised may also receive vaccine at local pharmacy or Health Dept. Verbalized acceptance and understanding.  Screening Tests Health Maintenance  Topic Date Due   Hepatitis C Screening  Never done   Zoster Vaccines- Shingrix (1 of 2) Never done   COVID-19 Vaccine (3 - 2024-25 season) 12/03/2022   HEMOGLOBIN A1C  06/12/2023   FOOT EXAM  09/14/2023   INFLUENZA VACCINE  11/02/2023   Diabetic kidney evaluation - Urine ACR  12/13/2023   OPHTHALMOLOGY EXAM  02/15/2024   Diabetic kidney  evaluation - eGFR measurement  05/24/2024   Fecal DNA (Cologuard)  08/22/2024   Medicare Annual Wellness (AWV)  11/21/2024   DTaP/Tdap/Td (2 - Td or Tdap) 10/18/2027   Pneumococcal Vaccine: 50+ Years  Completed   HPV VACCINES  Aged Out   Meningococcal B Vaccine  Aged Out    Health Maintenance  Health Maintenance Due  Topic Date Due   Hepatitis C Screening  Never done   Zoster Vaccines- Shingrix (1 of 2) Never done   COVID-19 Vaccine (3 - 2024-25 season) 12/03/2022   HEMOGLOBIN A1C  06/12/2023   FOOT EXAM  09/14/2023   INFLUENZA VACCINE  11/02/2023   Diabetic kidney evaluation - Urine ACR  12/13/2023    Colorectal cancer screening: Type of screening: Cologuard. Completed 08/22/2021. Repeat every 3 years  Lung Cancer Screening: (Low Dose CT Chest recommended if Age 38-80 years, 20 pack-year currently smoking OR have quit w/in 15years.) does not qualify.   Lung Cancer Screening Referral: n/a  Additional Screening:  Hepatitis C Screening: does qualify; Completed  Vision Screening: Recommended annual ophthalmology exams for early detection of glaucoma and other disorders of the eye. Is the patient up to date with their annual eye exam?  Yes  Who is the provider or what is the name of the office in which the patient attends annual eye exams? Dr Jeremy If pt is not established with a provider, would they like to be referred to a provider to establish care? N/a.   Dental Screening: Recommended annual dental exams for proper oral hygiene  Diabetic Foot Exam: Diabetic Foot Exam: Completed 09/14/2022  Community Resource Referral / Chronic Care Management: CRR required this visit?  No   CCM required this visit?  No     Plan:     I have personally reviewed and noted the following in the patient's chart:   Medical and social history Use of alcohol, tobacco or illicit drugs  Current medications and supplements including opioid prescriptions. Patient is not currently taking  opioid prescriptions. Functional ability and status Nutritional status Physical activity Advanced directives List of other physicians Hospitalizations, surgeries, and ER visits in previous 12 months. None Vitals Screenings to include cognitive, depression, and falls Referrals and appointments  In addition, I have reviewed and discussed with patient certain preventive protocols, quality metrics, and best practice recommendations. A written personalized care plan for preventive services as well as general preventive health recommendations were provided to patient.     Bonny Jon Mayor, CMA   11/22/2023   After Visit Summary: (In Person-Printed) AVS printed and given to the patient  Nurse Notes:   Josel Keo is a 73 y.o. male patient of Jeremy Mini, Jeremy Johnston who had a Medicare Annual Wellness Visit today. [place] is Retired and lives with their spouse. He has 3 children. He reports that he is socially active and does interact with friends/family regularly. He is moderately physically active and enjoys bowling.

## 2023-11-22 NOTE — Patient Instructions (Signed)
  [name] , Thank you for taking time to come for your Medicare Wellness Visit. I appreciate your ongoing commitment to your health goals. Please review the following plan we discussed and let me know if I can assist you in the future.   These are the goals we discussed:  Goals       Patient Stated (pt-stated)      Continue to stay healthy.      Patient Stated (pt-stated)      11/13/2022 AWV Goal: Diabetes Management  Patient will maintain an A1C level below 6.7 Patient will not develop any diabetic foot complications Patient will not experience any hypoglycemic episodes over the next 3 months Patient will notify our office of any CBG readings outside of the provider recommended range by calling [phone] Patient will adhere to provider recommendations for diabetes management  Patient Self Management Activities take all medications as prescribed and report any negative side effects monitor and record blood sugar readings as directed adhere to a low carbohydrate diet that incorporates lean proteins, vegetables, whole grains, low glycemic fruits check feet daily noting any sores, cracks, injuries, or callous formations see PCP or podiatrist if he notices any changes in his legs, feet, or toenails Patient will visit PCP and have an A1C level checked every 3 to 6 months as directed  have a yearly eye exam to monitor for vascular changes associated with diabetes and will request that the report be sent to his pcp.  consult with his PCP regarding any changes in his health or new or worsening symptoms       Patient Stated      Patient states he would like to be healed from diabetes.         This is a list of the screening recommended for you and due dates:  Health Maintenance  Topic Date Due   Hepatitis C Screening  Never done   Zoster (Shingles) Vaccine (1 of 2) Never done   COVID-19 Vaccine (3 - 2024-25 season) 12/03/2022   Hemoglobin A1C  06/12/2023   Complete foot exam    09/14/2023   Flu Shot  11/02/2023   Yearly kidney health urinalysis for diabetes  12/13/2023   Eye exam for diabetics  02/15/2024   Yearly kidney function blood test for diabetes  05/24/2024   Cologuard (Stool DNA test)  08/22/2024   Medicare Annual Wellness Visit  11/21/2024   DTaP/Tdap/Td vaccine (2 - Td or Tdap) 10/18/2027   Pneumococcal Vaccine for age over 49  Completed   HPV Vaccine  Aged Out   Meningitis B Vaccine  Aged Out

## 2023-11-30 ENCOUNTER — Ambulatory Visit: Admitting: Podiatry

## 2023-11-30 ENCOUNTER — Encounter: Payer: Self-pay | Admitting: Podiatry

## 2023-11-30 DIAGNOSIS — M79675 Pain in left toe(s): Secondary | ICD-10-CM | POA: Diagnosis not present

## 2023-11-30 DIAGNOSIS — E1042 Type 1 diabetes mellitus with diabetic polyneuropathy: Secondary | ICD-10-CM

## 2023-11-30 DIAGNOSIS — B351 Tinea unguium: Secondary | ICD-10-CM | POA: Diagnosis not present

## 2023-11-30 DIAGNOSIS — M79674 Pain in right toe(s): Secondary | ICD-10-CM

## 2023-11-30 NOTE — Progress Notes (Signed)
  Subjective:  Patient ID: Jeremy Johnston, male    DOB: [birth year],   MRN: [id number]  Chief Complaint  Patient presents with   Nail Problem    Nail trim     73 y.o. male presents for concern of thickened elongated and painful nails that are difficult to trim. Requesting to have them trimmed today. Relates burning and tingling in their feet. Patient is diabetic and last A1c was  Lab Results  Component Value Date   HGBA1C 7.2 11/23/2021   .   PCP:  Willo Mini, NP     Past Medical History:  Diagnosis Date   Arthritis    Cancer (HCC)    kidney cancer-both kidneys remain   Chronic kidney disease    Robotic Kidney surgery for kidney cancer tumor-kidney remains-   Coronary artery disease    Diabetes mellitus (HCC)    Insulin  pump -[name], and has a glucose transmitter attached.   Diabetic retinopathy (HCC)    left eye   Hyperlipidemia    Hypertension    Sciatica    Vitreous hemorrhage (HCC)    left eye    Objective:  Physical Exam: Vascular: DP/PT pulses 1/4 bilateral. CFT <3 seconds. Normal hair growth on digits. No edema.  Skin. No lacerations or abrasions bilateral feet. Hallux nail on left is thickened and dystrophic with subungual debris. Other nails normal in appearence but elongated. Hyperkeratotic cored lesion noted to lateral fifth metatarsal head with tenderness to palpation.  Musculoskeletal: MMT 5/5 bilateral lower extremities in DF, PF, Inversion and Eversion. Deceased ROM in DF of ankle joint.  Neurological: Sensation intact to light touch.   Assessment:   1. Pain due to onychomycosis of toenails of both feet   2. Type 1 diabetes mellitus with diabetic polyneuropathy (HCC)           Plan:  Patient was evaluated and treated and all questions answered. -Discussed and educated patient on diabetic foot care, especially with  regards to the vascular, neurological and musculoskeletal systems.  -Stressed the importance of good glycemic control and the  detriment of not  controlling glucose levels in relation to the foot. -Discussed supportive shoes at all times and checking feet regularly.  -Mechanically debrided nails 1-5 bilateral with nail nipper without incident.  -Answered all patient questions -Patient to return  in 3 months for at risk foot care -Patient advised to call the office if any problems or questions arise in the meantime.   Asberry Failing, DPM

## 2023-12-04 ENCOUNTER — Encounter: Payer: Self-pay | Admitting: Sports Medicine

## 2023-12-07 ENCOUNTER — Encounter (HOSPITAL_COMMUNITY)

## 2023-12-10 ENCOUNTER — Encounter (HOSPITAL_COMMUNITY)

## 2023-12-12 ENCOUNTER — Ambulatory Visit (HOSPITAL_COMMUNITY)
Admission: RE | Admit: 2023-12-12 | Discharge: 2023-12-12 | Disposition: A | Discharge status: Home / Self Care | Source: Ambulatory Visit | Attending: Interventional Cardiology | Admitting: Interventional Cardiology

## 2023-12-12 ENCOUNTER — Ambulatory Visit: Payer: Self-pay | Admitting: Cardiology

## 2023-12-12 ENCOUNTER — Ambulatory Visit (HOSPITAL_BASED_OUTPATIENT_CLINIC_OR_DEPARTMENT_OTHER)
Admission: RE | Admit: 2023-12-12 | Discharge: 2023-12-12 | Disposition: A | Discharge status: Home / Self Care | Source: Ambulatory Visit | Attending: Cardiology | Admitting: Cardiology

## 2023-12-12 ENCOUNTER — Ambulatory Visit (HOSPITAL_COMMUNITY)
Admission: RE | Admit: 2023-12-12 | Discharge: 2023-12-12 | Discharge status: Home / Self Care | Source: Ambulatory Visit | Attending: Cardiology | Admitting: Cardiology

## 2023-12-12 DIAGNOSIS — I6523 Occlusion and stenosis of bilateral carotid arteries: Secondary | ICD-10-CM | POA: Insufficient documentation

## 2023-12-12 DIAGNOSIS — I739 Peripheral vascular disease, unspecified: Secondary | ICD-10-CM

## 2023-12-13 LAB — VAS US ABI WITH/WO TBI
Left ABI: 0.69
Right ABI: 0.62

## 2023-12-17 ENCOUNTER — Ambulatory Visit: Payer: Self-pay | Admitting: Cardiology

## 2023-12-17 DIAGNOSIS — I6523 Occlusion and stenosis of bilateral carotid arteries: Secondary | ICD-10-CM

## 2023-12-18 ENCOUNTER — Ambulatory Visit: Attending: Cardiovascular Disease | Admitting: Cardiovascular Disease

## 2023-12-18 ENCOUNTER — Encounter: Payer: Self-pay | Admitting: Cardiovascular Disease

## 2023-12-18 VITALS — BP 159/90 | HR 64 | Ht 67.0 in | Wt 157.2 lb

## 2023-12-18 DIAGNOSIS — I739 Peripheral vascular disease, unspecified: Secondary | ICD-10-CM | POA: Diagnosis present

## 2023-12-18 DIAGNOSIS — I25810 Atherosclerosis of coronary artery bypass graft(s) without angina pectoris: Secondary | ICD-10-CM | POA: Insufficient documentation

## 2023-12-18 DIAGNOSIS — I6523 Occlusion and stenosis of bilateral carotid arteries: Secondary | ICD-10-CM | POA: Diagnosis not present

## 2023-12-18 DIAGNOSIS — I10 Essential (primary) hypertension: Secondary | ICD-10-CM | POA: Diagnosis not present

## 2023-12-18 DIAGNOSIS — E785 Hyperlipidemia, unspecified: Secondary | ICD-10-CM | POA: Diagnosis present

## 2023-12-18 NOTE — Patient Instructions (Signed)
 Medication Instructions:  No changes *If you need a refill on your cardiac medications before your next appointment, please call your pharmacy*  Lab Work: None ordered If you have labs (blood work) drawn today and your tests are completely normal, you will receive your results only by: MyChart Message (if you have MyChart) OR A paper copy in the mail If you have any lab test that is abnormal or we need to change your treatment, we will call you to review the results.  Testing/Procedures: None ordered  Follow-Up: At [facility], you and your health needs are our priority.  As part of our continuing mission to provide you with exceptional heart care, our providers are all part of one team.  This team includes your primary Cardiologist (physician) and Advanced Practice Providers or APPs (Physician Assistants and Nurse Practitioners) who all work together to provide you with the care you need, when you need it.  Your next appointment:   6 month(s)  Provider:   Dr. Darron  A referral has been made to rehab. They will call you to set up an appointment.   We recommend signing up for the patient portal called MyChart.  Sign up information is provided on this After Visit Summary.  MyChart is used to connect with patients for Virtual Visits (Telemedicine).  Patients are able to view lab/test results, encounter notes, upcoming appointments, etc.  Non-urgent messages can be sent to your provider as well.   To learn more about what you can do with MyChart, go to [email or web address].   Other Instructions EXERCISE PROGRAM FOR INDIVIDUALS WITH  PERIPHERAL ARTERIAL DISEASE (PAD)   General Information:   Research in vascular exercise has demonstrated remarkable improvement in symptoms of leg pain (claudication) without expensive or invasive interventions. Regular walking programs are extremely helpful for patients with PAD and intermittent claudication.  These steps are designed to  help you get started with a safe and effective program to help you walk farther with less pain:   Walk at least three times a week (preferably every day).  Your goal is to build up to 30-45 minutes of total walking time (not counting rest breaks). It may take you several weeks to build up your exercise time starting at 5-10 minutes or whatever you can tolerate.  Walk as far as possible using moderate to maximal pain (7-8 on the scale below) as a signal to stop, and resume walking when the pain goes away.  On a treadmill, set the speed and grade at a level that brings on the claudication pain within 3 to 5 minutes. Walk at this rate until you experience claudication of moderate severity, rest until the pain improves, and then resume walking.  Over time, you will be able to walk longer at the designated speed and grade; workload should then be increased until you develop the pain within 3 to 5 minutes once again.  This regimen will induce a significant benefit. Studies have demonstrated that participants may be able to walk up to three or four times farther and have less leg pain, within twelve weeks, by following this protocol.  Pain Scale    0_____1_____2_____3_____4_____5_____6_____7_____8_____9_____10   No Pain                                   Moderate Pain  Maximal Pain

## 2023-12-18 NOTE — Progress Notes (Signed)
 Cardiology Office Note   Date:  12/18/2023   ID:  Jeremy Johnston, DOB 22-Jan-1951, MRN [id number]  PCP:  Willo Mini, NP  Cardiologist:  Dr. Pietro  No chief complaint on file.     History of Present Illness: Jeremy Johnston is a 73 y.o. male who was referred by Dr. Pietro for evaluation and management of peripheral arterial disease. He has known history of coronary artery disease status post CABG in 2008, moderate left carotid stenosis,  status post kidney removal surgery, type 2 diabetes, essential hypertension and hyperlipidemia.  He reports bilateral calf claudication that started about 6 to 12 months ago and progressed gradually to happening with minimal exertion in June.  Since then, he read about peripheral arterial disease and started exercising on his own.  He noticed significant improvement with exercise and he is now able to walk half a mile to a mile without significant limitations.  However, he continues to have significant discomfort with incline.  No rest pain or lower extremity ulceration.  No chest pain or shortness of breath.  He underwent recent noninvasive vascular studies which showed an ABI of 0.62 on the right and 0.69 on the left.  Duplex on the right showed moderate common femoral artery stenosis and occluded proximal popliteal artery.  On the left, there was severe proximal SFA stenosis.  Past Medical History:  Diagnosis Date   Arthritis    Cancer (HCC)    kidney cancer-both kidneys remain   Chronic kidney disease    Robotic Kidney surgery for kidney cancer tumor-kidney remains-   Coronary artery disease    Diabetes mellitus (HCC)    Insulin  pump -[name], and has a glucose transmitter attached.   Diabetic retinopathy (HCC)    left eye   Hyperlipidemia    Hypertension    Sciatica    Vitreous hemorrhage (HCC)    left eye    Past Surgical History:  Procedure Laterality Date   CATARACT EXTRACTION, BILATERAL Bilateral    11'15   CORONARY ARTERY  BYPASS GRAFT  01/2007   x4 vessels([place] )   DIAGNOSTIC LAPAROSCOPY     EYE SURGERY     retina peele   HERNIA REPAIR     KIDNEY SURGERY     tumor removed   PARS PLANA VITRECTOMY 27 GAUGE Left 12/12/2016   Procedure: PARS PLANA VITRECTOMY 27 GAUGE , ENDOLASER GAS/ FLUID EXCHAGNE;  Surgeon: Alvia Norleen BIRCH, MD;  Location: [facility] OR;  Service: Ophthalmology;  Laterality: Left;   VASECTOMY       Current Outpatient Medications  Medication Sig Dispense Refill   Aflibercept  (EYLEA  IO) Inject into the eye.     ciprofloxacin (CILOXAN) 0.3 % ophthalmic solution Place 1 drop into both eyes 4 (four) times daily as needed.     clopidogrel  (PLAVIX ) 75 MG tablet Take 1 tablet (75 mg total) by mouth daily with breakfast. 90 tablet 3   dorzolamide -timolol  (COSOPT ) 22.3-6.8 MG/ML ophthalmic solution Place 1 drop into the right eye 2 (two) times daily.     ezetimibe  (ZETIA ) 10 MG tablet Take 1 tablet (10 mg total) by mouth daily. 90 tablet 3   famotidine  (PEPCID ) 20 MG tablet Take 20 mg by mouth daily as needed for heartburn or indigestion.     Insulin  Human (INSULIN  PUMP) 100 unit/ml SOLN Inject into the skin. Use with insulin  as directed     lisinopril  (ZESTRIL ) 10 MG tablet Take 1 tablet (10 mg total) by mouth daily. 90 tablet 3  lisinopril  (ZESTRIL ) 5 MG tablet Take 1 tablet (5 mg total) by mouth daily. 90 tablet 3   loratadine  (CLARITIN ) 10 MG tablet Take 10 mg by mouth daily as needed for allergies.     Multiple Vitamin (MULTIVITAMIN WITH MINERALS) TABS tablet Take 1 tablet by mouth 2 (two) times a week.      rosuvastatin  (CRESTOR ) 40 MG tablet TAKE 1 TABLET BY MOUTH AT BEDTIME 90 tablet 3   brimonidine  (ALPHAGAN ) 0.15 % ophthalmic solution Place 1 drop into both eyes 2 (two) times daily. (Patient not taking: Reported on 12/18/2023)  11   No current facility-administered medications for this visit.    Allergies:   Insulin  lispro and Meloxicam     Social History:  The patient  reports that he has  never smoked. He has never used smokeless tobacco. He reports that he does not drink alcohol and does not use drugs.   Family History:  The patient's family history includes Diabetes in his sister; Healthy in his brother, half-brother, half-brother, half-brother, half-sister, and half-sister; Heart attack in his father and sister.    ROS:  Please see the history of present illness.   Otherwise, review of systems are positive for none.   All other systems are reviewed and negative.    PHYSICAL EXAM: VS:  BP (!) 159/90 (BP Location: Left Arm, Patient Position: Sitting)   Pulse 64   Ht 5' 7 (1.702 m)   Wt 157 lb 3.2 oz (71.3 kg)   SpO2 99%   BMI 24.62 kg/m  , BMI Body mass index is 24.62 kg/m. GEN: Well nourished, well developed, in no acute distress  HEENT: normal  Neck: no JVD, carotid bruits, or masses Cardiac: RRR; no murmurs, rubs, or gallops,no edema  Respiratory:  clear to auscultation bilaterally, normal work of breathing GI: soft, nontender, nondistended, + BS MS: no deformity or atrophy  Skin: warm and dry, no rash Neuro:  Strength and sensation are intact Psych: euthymic mood, full affect Distal pulses are not palpable.   EKG:  EKG is not ordered today.    Recent Labs: 05/25/2023: BUN 19; Creatinine, Ser 1.13; Potassium 4.8; Sodium 141 07/12/2023: ALT 24    Lipid Panel    Component Value Date/Time   CHOL 93 (L) 07/12/2023 1016   TRIG 42 07/12/2023 1016   HDL 30 (L) 07/12/2023 1016   CHOLHDL 3.1 07/12/2023 1016   LDLCALC 52 07/12/2023 1016      Wt Readings from Last 3 Encounters:  12/18/23 157 lb 3.2 oz (71.3 kg)  11/22/23 156 lb (70.8 kg)  11/14/23 154 lb (69.9 kg)           No data to display            ASSESSMENT AND PLAN:  1.  Peripheral arterial disease: Moderate bilateral calf claudication Rutherford class II.  I discussed with him the natural history and management of claudication.  This is not a limb threatening situation at this  point.  We discussed the importance of controlling his risk factors. He noticed improvement in symptoms with exercise on his own but continues to have significant issues with incline. I elected to refer him to a structured exercise therapy program.  The patient was educated on risk factors surrounding PAD. I discussed the importance of controlling risk factors and importance of regular exercise to help reduce pain. I discussed the effects of exercise on reducing claudications and improving his risk factors including hyperlipidemia and hypertension.    2.  Coronary artery disease involving native coronary arteries post CABG: Currently with no anginal symptoms.  3.  Essential hypertension: Blood pressure is elevated today but his blood pressure is usually controlled.  4.  Carotid artery disease: This was moderate on most recent carotid Doppler.  5.  Hyperlipidemia: Excellent control with most recent LDL of 52.  He is currently on rosuvastatin  and ezetimibe .    Disposition:   FU with me in 6 months  Signed,  Deatrice Cage, MD  12/18/2023 8:20 AM     [facility]

## 2023-12-21 ENCOUNTER — Encounter (HOSPITAL_COMMUNITY): Payer: Self-pay

## 2023-12-21 ENCOUNTER — Telehealth (HOSPITAL_COMMUNITY): Payer: Self-pay

## 2023-12-21 NOTE — Telephone Encounter (Signed)
 Pt insurance is active and benefits verified through Medicare a/b Co-pay 0, DED $257/$257 met, out of pocket 0/0 met, co-insurance 20%. no pre-authorization required.    2ndary insurance is active and benefits verified through [employer]. Co-pay 0, DED 0/0 met, out of pocket 0/0 met, co-insurance 0%. No pre-authorization required.

## 2023-12-24 ENCOUNTER — Encounter (HOSPITAL_COMMUNITY): Payer: Self-pay

## 2023-12-27 ENCOUNTER — Encounter (HOSPITAL_COMMUNITY)
Admission: RE | Admit: 2023-12-27 | Discharge: 2023-12-27 | Disposition: A | Discharge status: Home / Self Care | Source: Ambulatory Visit | Attending: Cardiovascular Disease | Admitting: Cardiovascular Disease

## 2023-12-27 VITALS — BP 130/58 | HR 65 | Ht 67.0 in | Wt 156.1 lb

## 2023-12-27 DIAGNOSIS — I70213 Atherosclerosis of native arteries of extremities with intermittent claudication, bilateral legs: Secondary | ICD-10-CM | POA: Insufficient documentation

## 2023-12-27 NOTE — Progress Notes (Signed)
 PAT/SET Individual Treatment Plan  Patient Details  Name: Jeremy Johnston MRN: [id number] Date of Birth: [birth year] Referring Provider:   [name] Supervised Exercise Therapy from 12/27/2023 in [facility]  Referring Provider Dr. Deatrice Cage    Initial Encounter Date:  Flowsheet Row Supervised Exercise Therapy from 12/27/2023 in [facility]  Date 12/27/23    Visit Diagnosis: Atherosclerosis of native arteries of extremities with intermittent claudication, bilateral legs  Patient's Home Medications on Admission:  Current Outpatient Medications:    Aflibercept  (EYLEA  IO), Inject into the eye., Disp: , Rfl:    brimonidine  (ALPHAGAN ) 0.15 % ophthalmic solution, Place 1 drop into both eyes 2 (two) times daily., Disp: , Rfl: 11   ciprofloxacin (CILOXAN) 0.3 % ophthalmic solution, Place 1 drop into both eyes 4 (four) times daily as needed., Disp: , Rfl:    clopidogrel  (PLAVIX ) 75 MG tablet, Take 1 tablet (75 mg total) by mouth daily with breakfast., Disp: 90 tablet, Rfl: 3   dorzolamide -timolol  (COSOPT ) 22.3-6.8 MG/ML ophthalmic solution, Place 1 drop into the right eye 2 (two) times daily., Disp: , Rfl:    ezetimibe  (ZETIA ) 10 MG tablet, Take 1 tablet (10 mg total) by mouth daily., Disp: 90 tablet, Rfl: 3   famotidine  (PEPCID ) 20 MG tablet, Take 20 mg by mouth daily as needed for heartburn or indigestion., Disp: , Rfl:    Insulin  Human (INSULIN  PUMP) 100 unit/ml SOLN, Inject into the skin. Use with insulin  as directed, Disp: , Rfl:    lisinopril  (ZESTRIL ) 10 MG tablet, Take 1 tablet (10 mg total) by mouth daily., Disp: 90 tablet, Rfl: 3   lisinopril  (ZESTRIL ) 5 MG tablet, Take 1 tablet (5 mg total) by mouth daily., Disp: 90 tablet, Rfl: 3   loratadine  (CLARITIN ) 10 MG tablet, Take 10 mg by mouth daily as needed for allergies., Disp: , Rfl:    Multiple Vitamin  (MULTIVITAMIN WITH MINERALS) TABS tablet, Take 1 tablet by mouth 2 (two) times a week. , Disp: , Rfl:    rosuvastatin  (CRESTOR ) 40 MG tablet, TAKE 1 TABLET BY MOUTH AT BEDTIME, Disp: 90 tablet, Rfl: 3  Past Medical History: Past Medical History:  Diagnosis Date   Arthritis    Cancer (HCC)    kidney cancer-both kidneys remain   Chronic kidney disease    Robotic Kidney surgery for kidney cancer tumor-kidney remains-   Coronary artery disease    Diabetes mellitus (HCC)    Insulin  pump -[name], and has a glucose transmitter attached.   Diabetic retinopathy (HCC)    left eye   Hyperlipidemia    Hypertension    Sciatica    Vitreous hemorrhage (HCC)    left eye    Tobacco Use: Social History   Tobacco Use  Smoking Status Never  Smokeless Tobacco Never    Labs: Review Flowsheet  More data may exist      Latest Ref Rng & Units 12/12/2016 10/25/2017 05/25/2021 11/23/2021 07/12/2023  Labs for ITP Cardiac and Pulmonary Rehab  Cholestrol 100 - 199 mg/dL - 872  - 875     93   LDL (calc) 0 - 99 mg/dL - 75  - 74     52   HDL-C >39 mg/dL - 40  - 39     30   Trlycerides 0 - 149 mg/dL - 58  - 48     42   Hemoglobin A1c -  7.2  - 6.7     7.2     -    Details       This result is from an external source.         Capillary Blood Glucose: Lab Results  Component Value Date   GLUCAP 216 (H) 12/13/2016   GLUCAP 260 (H) 12/12/2016   GLUCAP 113 (H) 12/12/2016   GLUCAP 130 (H) 12/12/2016   GLUCAP 106 (H) 12/12/2016     Exercise Target Goals: Exercise Program Goal: Individual exercise prescription set with THRR, safety & activity barriers. Participant demonstrates ability to understand and report RPE using RPE 6-20 scale, to self-measure pulse accurately, and to acknowledge the importance of the exercise prescription.  Exercise Prescription Goal: Use initial exercise assessment to set exercise prescription to improve time and distance to onset of claudication pain. Provide education  to aid in steps toward risk factor modification, improve quality of life with claudication, and utilize THRR and exercise prescription for best results for decreasing symptoms of PAD. Prevent injury to bone, joints, and skin integrity during the exercise through checks of each system during session check in. Following physician guidelines for any contraindications to specific exercises.  Activity Barriers:   Gardner Treadmill Test Assessment:  Treadmill Test     Row Name 12/27/23 1415       Treadmill Test   Phase Pre    Time to Claudication Onset 3.5 minutes    Total Time to 3-4/5 Claudication Pain 8 minutes      Heart Rate   Rest 65 bpm    0% Grade 88 bpm    2% Grade 92 bpm    4% Grade 98 bpm    6% Grade 105 bpm    8% Grade 111 bpm      Blood Pressure   Rest 130/58    2 Min Post 120/52      Claudication Score   Rest 0    0% 0    2% Grade 2    4% Grade 2    6% Grade 3    8% Grade 3.5      RPE   2% Grade 6    4% Grade 8    6% Grade 8    8% Grade 10    10% Grade 11      Time in Stage   0% 2 minutes    2% Grade 2 minutes    4% Grade 2 minutes    6% Grade 2 minutes    8% Grade 2 minutes       Walking Impairment Questionnaire:  Walking Impairment Questionnaire - 12/27/23 1425       A. PAD Specific Questions   Leg Both    Pre Score 50    Pre % Score 1250 %      B. Differential Diagnosis   Pre Score 19      Distance   Pre Distance Total Score 50.64    Pre Distance % Score 0.36 %      Speed   Pre Speed Total Score 34.78    Pre Speed % Score 75.61 %      Stairs   Pre Stairs Total Score 29.17    Pre Stairs % Score 10.13 %      Distance, Speed, and Stairs   Pre Distance, Speed, and Stairs Total Score 114.59    Pre Distance, Speed, and Stairs % Score Mean 28.7 %  Expectation is that scores will be within the SD range. Distance Pre % Score Mean: 38% (SD 12% - 0.64%)   Post % Score Mean: 55% (SD 26% - 84%)   Mean % Change: 18% (SD  (-10%) - (46%))  Speed Pre % Score Mean: 41% (SD 19% - 63%)   Post % Score Mean: 52% (SD 30% - 74%)   Mean % Change: 11% (SD (-9%) - (31%))  Stairs Pre % Score Mean: 55% (SD 23% - 87%)   Post % Score Mean: 68% (SD 39% - 97%)   Mean % Change: 14% (SD (-15%) - (43%))  Total Score Pre % Score Mean: 45% (SD 23% - 67%   Post % Score Mean: 58% (SD 36% - 80%)   Mean % Change: 14% (SD (-5%) - (43%))   Oxygen Initial Assessment:   Oxygen Re-Evaluation:   Oxygen Discharge (Final Oxygen Re-Evaluation):   Initial Exercise Prescription:  Initial Exercise Prescription - 12/27/23 1400       Date of Initial Exercise RX and Referring Provider   Date 12/27/23    Referring Provider Dr. Deatrice Cage    Expected Discharge Date 03/17/24      Treadmill   MPH 2    Grade 3    Minutes 30    METs 3.4      Prescription Details   Frequency (times per week) 3days/week    Duration Progress to 30 minutes of continuous aerobic without signs/symptoms of physical distress      Intensity   THRR 40-80% of Max Heartrate 59-118    Ratings of Perceived Exertion 11-13    Perceived Dyspnea 0-4      Progression   Progression Continue to follow PAD protocol      Resistance Training   Training Prescription Yes    Weight 4lbs    Reps 10-15          Perform Capillary Blood Glucose checks as needed.  Exercise Prescription Changes:   Exercise Comments:   Exercise Comments     Row Name 12/27/23 1433           Exercise Comments Pt completed SET orienatation. Reviewed informed consent, PAD questionaire, WIQ, diabetic guidelines, and attendance policy. Pt completed GXT w/o unusual or unexpected symptoms. Claudication resolved after seated rest break.          Exercise Goals and Review:   Exercise Goals     Row Name 12/27/23 1432             Exercise Goals   Increase Physical Activity Yes       Intervention Provide advice, education, support and counseling about physical  activity/exercise needs.;Develop an individualized exercise prescription for aerobic and resistive training based on initial evaluation findings, risk stratification, comorbidities and participant's personal goals.       Expected Outcomes Short Term: Attend rehab on a regular basis to increase amount of physical activity.;Long Term: Add in home exercise to make exercise part of routine and to increase amount of physical activity.;Long Term: Exercising regularly at least 3-5 days a week.       Increase Strength and Stamina Yes       Intervention Develop an individualized exercise prescription for aerobic and resistive training based on initial evaluation findings, risk stratification, comorbidities and participant's personal goals.;Provide advice, education, support and counseling about physical activity/exercise needs.       Expected Outcomes Short Term: Increase workloads from initial exercise prescription for resistance, speed, and METs.;Short Term:  Perform resistance training exercises routinely during rehab and add in resistance training at home;Long Term: Improve cardiorespiratory fitness, muscular endurance and strength as measured by increased METs and functional capacity ( )       Able to understand and use rate of perceived exertion (RPE) scale Yes       Intervention Provide education and explanation on how to use RPE scale       Expected Outcomes Short Term: Able to use RPE daily in rehab to express subjective intensity level;Long Term:  Able to use RPE to guide intensity level when exercising independently       Knowledge and understanding of Target Heart Rate Range (THRR) Yes       Intervention Provide education and explanation of THRR including how the numbers were predicted and where they are located for reference       Expected Outcomes Long Term: Able to use THRR to govern intensity when exercising independently;Short Term: Able to state/look up THRR;Short Term: Able to use daily as  guideline for intensity in rehab       Understanding of Exercise Prescription Yes       Intervention Provide education, explanation, and written materials on patient's individual exercise prescription       Expected Outcomes Short Term: Able to explain program exercise prescription;Long Term: Able to explain home exercise prescription to exercise independently       Improve claudication pain toleration; Improve walking ability Yes       Intervention Participate in PAD/SET Rehab 2-3 days a week, walking at home as part of exercise prescription;Attend education sessions to aid in risk factor modification and understanding of disease process       Expected Outcomes Short Term: Improve walking distance/time to onset of claudication pain;Long Term: Improve walking ability and toleration to claudication;Long Term: Improve score of PAD questionnaires          Exercise Goals Re-Evaluation :    Discharge Exercise Prescription (Final Exercise Prescription Changes):   Nutrition:  Target Goals: Understanding of nutrition guidelines, daily intake of sodium 1500mg , cholesterol 200mg , calories 30% from fat and 7% or less from saturated fats, daily to have 5 or more servings of fruits and vegetables.  Biometrics:  Pre Biometrics - 12/27/23 1406       Pre Biometrics   Waist Circumference 38 inches    Hip Circumference 40 inches    Waist to Hip Ratio 0.95 %    Triceps Skinfold 6 mm    % Body Fat 22 %    Grip Strength 14 kg    Flexibility 10 in    Single Leg Stand 2 seconds            Psychosocial: Target Goals: Acknowledge presence or absence of significant depression and/or stress, maximize coping skills, provide positive support system. Participant is able to verbalize types and ability to use techniques and skills needed for reducing stress and depression.  Initial Review & Psychosocial Screening:   Quality of Life Scores:  Quality of Life - 12/27/23 1414       Quality of Life    Select PAD/SET Quality of Life      PAD/SET Quality of Life Scores   Social Relationships and Interactions Pre 37    Self-Concepts and Feelings Pre 37    Symptoms and Limitations Pre 36    Fear and Uncertainty Pre 19    Positive Adaptation Pre 35    Job Pre 3    Sex Pre 4  Intimate Relationships Pre 5          Scores should be at or above lower SD. Change of 5 points or better indicates improvement in first 5 Factors. Maximum score for last three Factors is 6.  Score Interpretation Lower SD  Social Relationships and Interactions 23.99  Self-Concept and Feelings 17.79  Symptoms and Limitations 11.75  Fear and Uncertainty 8.96  Positive Adaptation 22.29  Job 2.08  Sexual Function 1.43  Intimate Mean 2.08   PHQ-9: Review Flowsheet  More data exists      11/22/2023 11/13/2022 03/30/2022 11/07/2021 08/15/2021  Depression screen PHQ 2/9  Decreased Interest 0 0 0 0 0  Down, Depressed, Hopeless 0 0 0 0 0  PHQ - 2 Score 0 0 0 0 0   Interpretation of Total Score  Total Score Depression Severity:  1-4 = Minimal depression, 5-9 = Mild depression, 10-14 = Moderate depression, 15-19 = Moderately severe depression, 20-27 = Severe depression    Education: Education Goals: Education classes will be provided on a variety of topics geared toward better understanding of heart and vascular health and risk factor modification. Participant will state understanding/return demonstration of topics presented as noted by education test scores.  Learning Barriers/Preferences:  Learning Barriers/Preferences - 12/27/23 1415       Learning Barriers/Preferences   Learning Barriers None    Learning Preferences Individual Instruction;Written Material          Education Topics: Count Your Pulse:  -Group instruction provided by verbal instruction, demonstration, patient participation and written materials to support subject.  Instructors address importance of being able to find your pulse and how  to count your pulse when at home without a heart monitor.  Patients get hands on experience counting their pulse with staff help and individually.   Heart Attack, Angina, and Risk Factor Modification:  -Group instruction provided by verbal instruction, video, and written materials to support subject.  Instructors address signs and symptoms of angina and heart attacks.    Also discuss risk factors for heart disease and how to make changes to improve heart health risk factors.   Functional Fitness:  -Group instruction provided by verbal instruction, demonstration, patient participation, and written materials to support subject.  Instructors address safety measures for doing things around the house.  Discuss how to get up and down off the floor, how to pick things up properly, how to safely get out of a chair without assistance, and balance training.   Meditation and Mindfulness:  -Group instruction provided by verbal instruction, patient participation, and written materials to support subject.  Instructor addresses importance of mindfulness and meditation practice to help reduce stress and improve awareness.  Instructor also leads participants through a meditation exercise.    Stretching for Flexibility and Mobility:  -Group instruction provided by verbal instruction, patient participation, and written materials to support subject.  Instructors lead participants through series of stretches that are designed to increase flexibility thus improving mobility.  These stretches are additional exercise for major muscle groups that are typically performed during regular warm up and cool down.   Hands Only CPR:  -Group verbal, video, and participation provides a basic overview of AHA guidelines for community CPR. Role-play of emergencies allow participants the opportunity to practice calling for help and chest compression technique with discussion of AED use.   Hypertension: -Group verbal and written  instruction that provides a basic overview of hypertension including the most recent diagnostic guidelines, risk factor reduction with self-care instructions  and medication management.    Nutrition I class: Heart Healthy Eating:  -Group instruction provided by PowerPoint slides, verbal discussion, and written materials to support subject matter. The instructor gives an explanation and review of the Therapeutic Lifestyle Changes diet recommendations, which includes a discussion on lipid goals, dietary fat, sodium, fiber, plant stanol/sterol esters, sugar, and the components of a well-balanced, healthy diet.   Nutrition II class: Lifestyle Skills:  -Group instruction provided by PowerPoint slides, verbal discussion, and written materials to support subject matter. The instructor gives an explanation and review of label reading, grocery shopping for heart health, heart healthy recipe modifications, and ways to make healthier choices when eating out.   Diabetes Question & Answer:  -Group instruction provided by PowerPoint slides, verbal discussion, and written materials to support subject matter. The instructor gives an explanation and review of diabetes co-morbidities, pre- and post-prandial blood glucose goals, pre-exercise blood glucose goals, signs, symptoms, and treatment of hypoglycemia and hyperglycemia, and foot care basics.   Diabetes Blitz:  -Group instruction provided by PowerPoint slides, verbal discussion, and written materials to support subject matter. The instructor gives an explanation and review of the physiology behind type 1 and type 2 diabetes, diabetes medications and rational behind using different medications, pre- and post-prandial blood glucose recommendations and Hemoglobin A1c goals, diabetes diet, and exercise including blood glucose guidelines for exercising safely.    Portion Distortion:  -Group instruction provided by PowerPoint slides, verbal discussion, written  materials, and food models to support subject matter. The instructor gives an explanation of serving size versus portion size, changes in portions sizes over the last 20 years, and what consists of a serving from each food group.   Stress Management:  -Group instruction provided by verbal instruction, video, and written materials to support subject matter.  Instructors review role of stress in heart disease and how to cope with stress positively.     Exercising on Your Own:  -Group instruction provided by verbal instruction, power point, and written materials to support subject.  Instructors discuss benefits of exercise, components of exercise, frequency and intensity of exercise, and end points for exercise.  Also discuss use of nitroglycerin and activating EMS.  Review options of places to exercise outside of rehab.  Review guidelines for sex with heart disease.   Cardiac Drugs I:  -Group instruction provided by verbal instruction and written materials to support subject.  Instructor reviews cardiac drug classes: antiplatelets, anticoagulants, beta blockers, and statins.  Instructor discusses reasons, side effects, and lifestyle considerations for each drug class.   Cardiac Drugs II:  -Group instruction provided by verbal instruction and written materials to support subject.  Instructor reviews cardiac drug classes: angiotensin converting enzyme inhibitors (ACE-I), angiotensin II receptor blockers (ARBs), nitrates, and calcium  channel blockers.  Instructor discusses reasons, side effects, and lifestyle considerations for each drug class.   Anatomy and Physiology of the Circulatory System:  Group verbal and written instruction and models provide basic cardiac anatomy and physiology, with the coronary electrical and arterial systems. Review of: AMI, Angina, Valve disease, Heart Failure, Peripheral Artery Disease, Cardiac Arrhythmia, Pacemakers, and the ICD.   Other Education:  -Group or  individual verbal, written, or video instructions that support the educational goals of the cardiac rehab program.    ITP Comments:  ITP Comments     Row Name 12/27/23 1406           ITP Comments Dr Darron Supervising Physician  Comments: Pt completed SET orientation. I reviewed informed consent, WIQ, PAD questionnaire diabetic guidelines, medications, and attendance policy. Pt completed GXT and  measurements w/o unusual or unexpected symptoms. Pt plans to return tomorrow for first day of exercise.

## 2023-12-28 ENCOUNTER — Encounter (HOSPITAL_COMMUNITY)
Admission: RE | Admit: 2023-12-28 | Discharge: 2023-12-28 | Disposition: A | Discharge status: Home / Self Care | Source: Ambulatory Visit | Attending: Cardiovascular Disease | Admitting: Cardiovascular Disease

## 2023-12-28 DIAGNOSIS — I70213 Atherosclerosis of native arteries of extremities with intermittent claudication, bilateral legs: Secondary | ICD-10-CM

## 2023-12-28 LAB — GLUCOSE, CAPILLARY
Glucose-Capillary: 141 mg/dL — ABNORMAL HIGH (ref 70–99)
Glucose-Capillary: 84 mg/dL (ref 70–99)

## 2023-12-31 ENCOUNTER — Encounter (HOSPITAL_COMMUNITY)
Admission: RE | Admit: 2023-12-31 | Discharge: 2023-12-31 | Disposition: A | Source: Ambulatory Visit | Attending: Cardiovascular Disease

## 2023-12-31 DIAGNOSIS — I70213 Atherosclerosis of native arteries of extremities with intermittent claudication, bilateral legs: Secondary | ICD-10-CM

## 2023-12-31 NOTE — Progress Notes (Signed)
 Daily Session Note  Patient Details  Name: Jeremy Johnston MRN: [id number] Date of Birth: Aug 09, 1950 Referring Provider:   [name] Supervised Exercise Therapy from 12/27/2023 in [facility]  Referring Provider Dr. Deatrice Cage    Encounter Date: 12/28/2023  Check In:   Capillary Blood Glucose: No results found for this or any previous visit (from the past 24 hours).    Social History   Tobacco Use  Smoking Status Never  Smokeless Tobacco Never    Goals Met:  Independence with exercise equipment Exercise tolerated well Strength training completed today  Goals Unmet:  Not Applicable  Comments: Pt was cleared for supervised exercise therapy by Dr. Deatrice Cage  Due to patients hx of CV disease pt is being watched closely and monitored for any unusual signs/symptoms while exercising. Will continue to watch closely, monitor, and report any unusual or unexpected pain through the program.  Pt exercised on the Treadmill  for 25 minutes and experienced 2.5/5 claudication pain that resolved with sitting rest breaks. Pt tolerated exercise w/o unusual and unexpected symptoms. Will continue to monitor and progress through the program following PAD protocol.    Pt CBG 84 post-exercise, given juice recheck 108  Service time is from 1430 to 1600.    Dr. Wilbert Bihari is Medical Director for Cardiac Rehab at [facility].

## 2024-01-01 LAB — GLUCOSE, CAPILLARY
Glucose-Capillary: 150 mg/dL — ABNORMAL HIGH (ref 70–99)
Glucose-Capillary: 114 mg/dL — ABNORMAL HIGH (ref 70–99)
Glucose-Capillary: 96 mg/dL (ref 70–99)

## 2024-01-01 NOTE — Progress Notes (Signed)
 Daily Session Note  Patient Details  Name: Jeremy Johnston MRN: [id number] Date of Birth: March 30, 1951 Referring Provider:   [name] Supervised Exercise Therapy from 12/27/2023 in [facility] V[facility] Lung [facility]  Referring Provider Dr. Deatrice Cage    Encounter Date: 12/31/2023  Check In:  Session Check In - 12/31/23 1526       Check-In   Supervising physician immediately available to respond to emergencies [facility] MD immediately available    Physician(s) Rosaline Bane, NP    Location [facility]    Staff Present Garen Candy, MS, Exercise Physiologist    Virtual Visit No    Medication changes reported     No    Fall or balance concerns reported    No    Tobacco Cessation No Change    Warm-up and Cool-down Performed as group-led instruction    Resistance Training Performed Yes    VAD Patient? No    PAD/SET Patient? Yes      PAD/SET Patient   Completed foot check today? Yes    Open wounds to report? No      Pain Assessment   Currently in Pain? No/denies    Multiple Pain Sites No          Capillary Blood Glucose: No results found for this or any previous visit (from the past 24 hours).    Social History   Tobacco Use  Smoking Status Never  Smokeless Tobacco Never    Goals Met:  Independence with exercise equipment Exercise tolerated well Strength training completed today  Goals Unmet:  Not Applicable  Comments: Pt was cleared for supervised exercise therapy by Dr. Deatrice Cage  Due to patients hx of CV disease pt is being watched closely and monitored for any unusual signs/symptoms while exercising. Will continue to watch closely, monitor, and report any unusual or unexpected pain through the program.  Pt exercised on the Treadmill  for 55 minutes and experienced 3/5 claudication pain that resolved with sitting rest breaks. Pt tolerated exercise w/o unusual and unexpected symptoms. Will continue  to monitor and progress through the program following PAD protocol.     Service time is from 1433 to 1600.    Dr. Wilbert Bihari is Medical Director for Cardiac Rehab at [facility].

## 2024-01-02 ENCOUNTER — Encounter (HOSPITAL_COMMUNITY)
Admission: RE | Admit: 2024-01-02 | Discharge: 2024-01-02 | Disposition: A | Discharge status: Home / Self Care | Source: Ambulatory Visit | Attending: Cardiovascular Disease | Admitting: Cardiovascular Disease

## 2024-01-02 DIAGNOSIS — I70213 Atherosclerosis of native arteries of extremities with intermittent claudication, bilateral legs: Secondary | ICD-10-CM | POA: Insufficient documentation

## 2024-01-02 LAB — GLUCOSE, CAPILLARY: Glucose-Capillary: 285 mg/dL — ABNORMAL HIGH (ref 70–99)

## 2024-01-02 NOTE — Progress Notes (Signed)
 Daily Session Note  Patient Details  Name: Jeremy Johnston MRN: [id number] Date of Birth: 12-Jan-1951 Referring Provider:   [name] Supervised Exercise Therapy from 12/27/2023 in [facility]  Referring Provider Dr. Deatrice Cage    Encounter Date: 01/02/2024  Check In:  Session Check In - 01/02/24 1526       Check-In   Supervising physician immediately available to respond to emergencies [facility] MD immediately available    Physician(s) Rosaline Bane, NP    Location [facility]    Staff Present Con Pereyra, MS, Exercise Physiologist    Virtual Visit No    Medication changes reported     No    Fall or balance concerns reported    No    Tobacco Cessation No Change    Warm-up and Cool-down Performed as group-led instruction    Resistance Training Performed No    VAD Patient? No    PAD/SET Patient? Yes      PAD/SET Patient   Completed foot check today? Yes    Open wounds to report? No      Pain Assessment   Currently in Pain? No/denies    Multiple Pain Sites No          Capillary Blood Glucose: Results for orders placed or performed during the hospital encounter of 01/02/24 (from the past 24 hours)  Glucose, capillary     Status: Abnormal   Collection Time: 01/02/24  2:46 PM  Result Value Ref Range   Glucose-Capillary 285 (H) 70 - 99 mg/dL      Social History   Tobacco Use  Smoking Status Never  Smokeless Tobacco Never    Goals Met:  Independence with exercise equipment Exercise tolerated well No report of concerns or symptoms today  Goals Unmet:  Not Applicable  Comments: Pt was cleared for supervised exercise therapy by Dr. Deatrice Cage  Due to patients hx of CV disease pt is being watched closely and monitored for any unusual signs/symptoms while exercising. Will continue to watch closely, monitor, and report any unusual or unexpected pain through the program.  Pt  exercised on the Treadmill  for 40 minutes and experienced 3/5 claudication pain that resolved with sitting rest breaks. Pt tolerated exercise w/o unusual and unexpected symptoms. Will continue to monitor and progress through the program following PAD protocol.    Initial onset of claudication at: 8:00 Longest exercised time before break: 40:00 Total exercise time/rest time: 50:00/ 0   Service time is from 1439 to 1550.    Dr. Wilbert Bihari is Medical Director for Cardiac Rehab at [facility].

## 2024-01-04 ENCOUNTER — Encounter (HOSPITAL_COMMUNITY)
Admission: RE | Admit: 2024-01-04 | Discharge: 2024-01-04 | Disposition: A | Discharge status: Home / Self Care | Source: Ambulatory Visit | Attending: Cardiovascular Disease | Admitting: Cardiovascular Disease

## 2024-01-04 DIAGNOSIS — I70213 Atherosclerosis of native arteries of extremities with intermittent claudication, bilateral legs: Secondary | ICD-10-CM | POA: Diagnosis not present

## 2024-01-04 NOTE — Progress Notes (Signed)
 Daily Session Note  Patient Details  Name: Jeremy Johnston MRN: [id number] Date of Birth: December 18, 1950 Referring Provider:   [name] Supervised Exercise Therapy from 12/27/2023 in [facility], [facility], [facility]  Referring Provider Dr. Deatrice Cage    Encounter Date: 01/04/2024  Check In:  Session Check In - 01/04/24 1509       Check-In   Supervising physician immediately available to respond to emergencies [facility] immediately available    Physician(s) Jackee Wyn Raddle, NP    [facility]    Staff Present Con Pereyra, MS, Exercise Physiologist;David Janann, MS, ACSM-CEP, CCRP, Exercise Physiologist    Virtual Visit No    Medication changes reported     No    Fall or balance concerns reported    No    Tobacco Cessation No Change    Warm-up and Cool-down Performed as group-led instruction    Resistance Training Performed Yes    VAD Patient? No    PAD/SET Patient? No      PAD/SET Patient   Completed foot check today? Yes    Open wounds to report? No      Pain Assessment   Currently in Pain? No/denies    Multiple Pain Sites No          Capillary Blood Glucose: No results found for this or any previous visit (from the past 24 hours).    Social History   Tobacco Use  Smoking Status Never  Smokeless Tobacco Never    Goals Met:  Independence with exercise equipment Exercise tolerated well No report of concerns or symptoms today Strength training completed today 5 lb wts  Goals Unmet:  Not Applicable  Comments: Pt was cleared for supervised exercise therapy by Dr. Deatrice Cage  Due to patients hx of CV disease pt is being watched closely and monitored for any unusual signs/symptoms while exercising. Will continue to watch closely, monitor, and report any unusual or unexpected pain through the program.  Pt exercised on the Treadmill  for 30 minutes and experienced 3/5 claudication pain that  resolved with sitting rest breaks. Pt tolerated exercise w/o unusual and unexpected symptoms. Will continue to monitor and progress through the program following PAD protocol.    Initial onset of claudication at: 5:00 min Longest exercised time before break: 30:00 min Total exercise time/rest time: 30:00 min/0:00  Service time is from 1437 to 1545.    Dr. Wilbert Bihari is Medical Director for Cardiac Rehab at [facility].

## 2024-01-07 ENCOUNTER — Encounter (HOSPITAL_COMMUNITY)
Admission: RE | Admit: 2024-01-07 | Discharge: 2024-01-07 | Disposition: A | Discharge status: Home / Self Care | Source: Ambulatory Visit | Attending: Cardiovascular Disease | Admitting: Cardiovascular Disease

## 2024-01-07 DIAGNOSIS — I70213 Atherosclerosis of native arteries of extremities with intermittent claudication, bilateral legs: Secondary | ICD-10-CM | POA: Diagnosis not present

## 2024-01-08 ENCOUNTER — Encounter (INDEPENDENT_AMBULATORY_CARE_PROVIDER_SITE_OTHER): Admitting: Ophthalmology

## 2024-01-08 DIAGNOSIS — E113513 Type 2 diabetes mellitus with proliferative diabetic retinopathy with macular edema, bilateral: Secondary | ICD-10-CM | POA: Diagnosis not present

## 2024-01-08 DIAGNOSIS — I10 Essential (primary) hypertension: Secondary | ICD-10-CM

## 2024-01-08 DIAGNOSIS — H35033 Hypertensive retinopathy, bilateral: Secondary | ICD-10-CM | POA: Diagnosis not present

## 2024-01-08 DIAGNOSIS — Z794 Long term (current) use of insulin: Secondary | ICD-10-CM | POA: Diagnosis not present

## 2024-01-08 DIAGNOSIS — H43811 Vitreous degeneration, right eye: Secondary | ICD-10-CM

## 2024-01-08 NOTE — Progress Notes (Signed)
 Daily Session Note  Patient Details  Name: Jeremy Johnston MRN: [id number] Date of Birth: January 02, 1951 Referring Provider:   [name] Supervised Exercise Therapy from 12/27/2023 in [facility], Vascular, & Lung Health  Referring Provider Dr. Deatrice Cage    Encounter Date: 01/07/2024  Check In:  Session Check In - 01/07/24 1521       Check-In   Supervising physician immediately available to respond to emergencies [facility] immediately available    Physician(s) Barnie Hila, NP    [facility]    Staff Present Alm Parkins, MS, ACSM-CEP, CCRP, Exercise Physiologist;Olinty Valere, MS, ACSM-CEP, Exercise Physiologist;Joseph Lennon, RN, Jayson Quan, RN, BSN;Jetta Walker BS, ACSM-CEP, Exercise Physiologist    Virtual Visit No    Medication changes reported     No    Fall or balance concerns reported    No    Tobacco Cessation No Change    Warm-up and Cool-down Performed as group-led instruction    Resistance Training Performed Yes    VAD Patient? No    PAD/SET Patient? Yes      PAD/SET Patient   Completed foot check today? Yes    Open wounds to report? Yes      Pain Assessment   Currently in Pain? No/denies    Multiple Pain Sites No          Capillary Blood Glucose: No results found for this or any previous visit (from the past 24 hours).    Social History   Tobacco Use  Smoking Status Never  Smokeless Tobacco Never    Goals Met:  Independence with exercise equipment Exercise tolerated well No report of concerns or symptoms today Strength training completed today  Goals Unmet:  Not Applicable  Comments: Pt was cleared for supervised exercise therapy by Dr. Deatrice Cage  Due to patients hx of CV disease pt is being watched closely and monitored for any unusual signs/symptoms while exercising. Will continue to watch closely, monitor, and report any unusual or unexpected pain through  the program.  Pt exercised on the Treadmill  for 35 minutes and experienced 2/5 claudication pain that resolved with sitting rest breaks. Pt tolerated exercise w/o unusual and unexpected symptoms. Will continue to monitor and progress through the program following PAD protocol.    Initial onset of claudication at:7:00 Longest exercised time before break: 35 Total exercise time/rest time: 35/0  Service time is from 1436 to 1600.    Dr. Wilbert Bihari is Medical Director for Cardiac Rehab at [facility].

## 2024-01-09 ENCOUNTER — Encounter (HOSPITAL_COMMUNITY)
Admission: RE | Admit: 2024-01-09 | Discharge: 2024-01-09 | Disposition: A | Source: Ambulatory Visit | Attending: Cardiovascular Disease

## 2024-01-09 DIAGNOSIS — I70213 Atherosclerosis of native arteries of extremities with intermittent claudication, bilateral legs: Secondary | ICD-10-CM | POA: Diagnosis not present

## 2024-01-11 ENCOUNTER — Encounter (HOSPITAL_COMMUNITY)
Admission: RE | Admit: 2024-01-11 | Discharge: 2024-01-11 | Disposition: A | Discharge status: Home / Self Care | Source: Ambulatory Visit | Attending: Cardiovascular Disease | Admitting: Cardiovascular Disease

## 2024-01-11 DIAGNOSIS — I70213 Atherosclerosis of native arteries of extremities with intermittent claudication, bilateral legs: Secondary | ICD-10-CM | POA: Diagnosis not present

## 2024-01-11 LAB — GLUCOSE, CAPILLARY: Glucose-Capillary: 127 mg/dL — ABNORMAL HIGH (ref 70–99)

## 2024-01-14 ENCOUNTER — Encounter (HOSPITAL_COMMUNITY)
Admission: RE | Admit: 2024-01-14 | Discharge: 2024-01-14 | Disposition: A | Source: Ambulatory Visit | Attending: Cardiovascular Disease

## 2024-01-14 DIAGNOSIS — I70213 Atherosclerosis of native arteries of extremities with intermittent claudication, bilateral legs: Secondary | ICD-10-CM

## 2024-01-14 NOTE — Progress Notes (Signed)
 Daily Session Note  Patient Details  Name: Jeremy Johnston MRN: [id number] Date of Birth: Dec 13, 1950 Referring Provider:   [name] Supervised Exercise Therapy from 12/27/2023 in [facility] for [facility], Vascular, & Lung [facility]  Referring Provider Dr. Deatrice Cage    Encounter Date: 01/11/2024  Check In: Session Check In - 01/11/24  Check-In     Supervising physician immediately available to respond to emergencies [facility] immediately available     Physician(s) Barnie Hila, NP     [facility]     Staff Present Alm Parkins, MS, ACSM-CEP, CCRP, Exercise Physiologist;Olinty Valere, MS, ACSM-CEP, Exercise Physiologist;Joseph Lennon, RN, Jayson Quan, RN, BSN;Jetta Walker BS, ACSM-CEP, Exercise Physiologist     Virtual Visit No     Medication changes reported     No     Fall or balance concerns reported    No     Tobacco Cessation No Change     Warm-up and Cool-down Performed as group-led instruction     Resistance Training Performed Yes     VAD Patient? No     PAD/SET Patient? Yes          PAD/SET Patient    Completed foot check today? Yes     Open wounds to report? Yes          Pain Assessment    Currently in Pain? No/denies     Multiple Pain Sites No      Capillary Blood Glucose: No results found for this or any previous visit (from the past 24 hours).    Social History   Tobacco Use  Smoking Status Never  Smokeless Tobacco Never    Goals Met:  Independence with exercise equipment Exercise tolerated well  Goals Unmet:  Pt BS dropped to 102 during exercise, pt given crackers and was rechecked at 120  Comments: Pt was cleared for supervised exercise therapy by Dr. Deatrice Cage  Due to patients hx of CV disease pt is being watched closely and monitored for any unusual signs/symptoms while exercising. Will continue to watch closely, monitor, and report any unusual or unexpected pain through the  program.  Pt exercised on the Treadmill  for 15 minutes and experienced 3/5 claudication pain that resolved with sitting rest breaks. Pt tolerated exercise w/o unusual and unexpected symptoms. Will continue to monitor and progress through the program following PAD protocol.    Service time is from 1430 to 1600.   Dr. Wilbert Bihari is Medical Director for Cardiac Rehab at [facility].

## 2024-01-16 ENCOUNTER — Encounter (HOSPITAL_COMMUNITY)
Admission: RE | Admit: 2024-01-16 | Discharge: 2024-01-16 | Disposition: A | Source: Ambulatory Visit | Attending: Cardiovascular Disease

## 2024-01-16 DIAGNOSIS — I70213 Atherosclerosis of native arteries of extremities with intermittent claudication, bilateral legs: Secondary | ICD-10-CM | POA: Diagnosis not present

## 2024-01-18 ENCOUNTER — Ambulatory Visit (HOSPITAL_COMMUNITY)

## 2024-01-21 ENCOUNTER — Encounter (HOSPITAL_COMMUNITY)
Admission: RE | Admit: 2024-01-21 | Discharge: 2024-01-21 | Disposition: A | Discharge status: Home / Self Care | Source: Ambulatory Visit | Attending: Cardiovascular Disease | Admitting: Cardiovascular Disease

## 2024-01-21 DIAGNOSIS — I70213 Atherosclerosis of native arteries of extremities with intermittent claudication, bilateral legs: Secondary | ICD-10-CM

## 2024-01-22 NOTE — Progress Notes (Signed)
 PAD/SET Plan of Care Updates  Patient Details  Name: Jeremy Johnston MRN: [id number] Date of Birth: Jun 16, 1950  Entry Date:  Flowsheet Row Supervised Exercise Therapy from 12/27/2023 in [facility], Vascular, & Lung [facility]  Date 12/27/23   Expected Discharge Date:  Flowsheet Row Supervised Exercise Therapy from 12/27/2023 in [facility] for [facility], Vascular, & Lung Health  Expected Discharge Date 03/17/24    Medical Diagnosis: Atherosclerosis of native arteries of extremities with intermittent claudication, bilateral legs  Encounter Date: 01/22/24  Exercise Target Goals: Exercise Program Goal: Individual exercise prescription set with THRR, safety & activity barriers. Participant demonstrates ability to understand and report RPE using RPE 6-20 scale, to self-measure pulse accurately, and to acknowledge the importance of the exercise prescription.  Exercise Prescription Goal: Use initial exercise assessment to set exercise prescription to improve time and distance to onset of claudication pain. Provide education to aid in steps toward risk factor modification, improve quality of life with claudication, and utilize THRR and exercise prescription for best results for decreasing symptoms of PAD. Prevent injury to bone, joints, and skin integrity during the exercise through checks of each system during session check in. Following physician guidelines for any contraindications to specific exercises.  Activity Barriers:   Initial Exercise Prescription:  Initial Exercise Prescription - 12/27/23 1400       Date of Initial Exercise RX and Referring Provider   Date 12/27/23    Referring Provider Dr. Deatrice Cage    Expected Discharge Date 03/17/24      Treadmill   MPH 2    Grade 3    Minutes 30    METs 3.4      Prescription Details   Frequency (times per week) 3days/week    Duration Progress to 30 minutes of continuous aerobic  without signs/symptoms of physical distress      Intensity   THRR 40-80% of Max Heartrate 59-118    Ratings of Perceived Exertion 11-13    Perceived Dyspnea 0-4      Progression   Progression Continue to follow PAD protocol      Resistance Training   Training Prescription Yes    Weight 4lbs    Reps 10-15          Perform Capillary Blood Glucose checks as needed.  Current Exercise Prescription:   Exercise Prescription Changes     Row Name 12/28/23 1430 12/31/23 0700 12/31/23 1430 01/01/24 0700 01/02/24 1430     Response to Exercise   Blood Pressure (Admit) 126/58 -- 142/56 -- 142/56   Blood Pressure (Exercise) 142/80 -- 146/58 -- 146/58   Blood Pressure (Exit) 98/60 -- 118/68 -- 118/68   Heart Rate (Admit) 71 bpm -- 69 bpm -- 79 bpm   Heart Rate (Exercise) 99 bpm -- 82 bpm -- 87 bpm   Heart Rate (Exit) 77 bpm -- 69 bpm -- 82 bpm   Oxygen Saturation (Admit) 97 % -- 97 % -- --   Oxygen Saturation (Exercise) 97 % -- 98 % -- --   Oxygen Saturation (Exit) 98 % -- 99 % -- --   Rating of Perceived Exertion (Exercise) 12 -- 13 -- 11   Symptoms BL claudication -- BL claudication -- 2/5 BL claudication   Comments 2.5 Claudication -- 3/5 claudication -- Pt increased to 5% incline   Duration Continue with 30 min of aerobic exercise without signs/symptoms of physical distress. -- Continue with 30 min of aerobic exercise without  signs/symptoms of physical distress. -- Continue with 30 min of aerobic exercise without signs/symptoms of physical distress.   Intensity THRR unchanged -- THRR unchanged -- THRR unchanged     Progression   Progression Continue to follow PAD protocol -- Continue to follow PAD protocol -- Continue to follow PAD protocol   Average METs 3.4 -- 3.6 -- 3.9     Resistance Training   Training Prescription Yes -- Yes -- --   Weight 4lbs -- 4 lbs -- --   Reps 10-15 -- 10-15 -- --     Treadmill   MPH 2 -- 2 -- 2   Grade 3 -- 4 -- 5   Minutes 30 -- 55 -- 40    METs 3.4 -- 3.6 -- 3.9    Row Name 01/09/24 1430 01/14/24 1430 01/16/24 1430         Response to Exercise   Blood Pressure (Admit) 118/54 132/58 140/56     Blood Pressure (Exercise) 136/58 138/56 146/60     Blood Pressure (Exit) 92/56 120/66 128/88     Heart Rate (Admit) 89 bpm 73 bpm 80 bpm     Heart Rate (Exercise) 108 bpm 111 bpm 111 bpm     Heart Rate (Exit) 95 bpm 99 bpm 99 bpm     Rating of Perceived Exertion (Exercise) 12 -- --     Symptoms 3/5 BL claudication 3/5 BL claudication 3/5 BL claudication     Comments Pt increased to 7% incline Pain has shifted from higher calf to lower calf INC incline to 9%     Duration Continue with 30 min of aerobic exercise without signs/symptoms of physical distress. Continue with 30 min of aerobic exercise without signs/symptoms of physical distress. Continue with 30 min of aerobic exercise without signs/symptoms of physical distress.     Intensity THRR unchanged THRR unchanged THRR unchanged       Progression   Progression Continue to follow PAD protocol Continue to follow PAD protocol Continue to follow PAD protocol     Average METs 4.5 4.7 5       Resistance Training   Training Prescription -- Yes --     Weight -- 5 lbs --     Reps -- 10-15 --     Time -- 5 Minutes --       Treadmill   MPH 2 2 2      Grade 7 8 9      Minutes 30 40 30     METs 4.5 4.7 5        Exercise Comments:   Exercise Comments     Row Name 12/27/23 1433 12/28/23 0724 12/31/23 0715 01/22/24 0727     Exercise Comments Pt completed SET orienatation. Reviewed informed consent, PAD questionaire, WIQ, diabetic guidelines, and attendance policy. Pt completed GXT w/o unusual or unexpected symptoms. Claudication resolved after seated rest break. Pt first day of exercise, pt c/p of BL claudication in both calves that resolved with sitting rest break. Post-exercise CBG was 84, pt was given juice and re-checked at 108. Incline increased to 4%, pt tolerated change well.  Will increase to 5% on Wednesday. Pt given juice for low blood sugar, recheeck: 114 Pt has completed 4 weeks of PAD program. During those 4 weeks pt incline was increased quickly d/t multiple walking bouts of 20 mins+. Pt has kept his BS in good standing prior to exercise and is given snacks occasionally when his BS drops. Pt is repeating ExRx on  home TM.       Program and Patient Goals Exercise Goals and Review:   Exercise Goals     Row Name 12/27/23 1432             Exercise Goals   Increase Physical Activity Yes       Intervention Provide advice, education, support and counseling about physical activity/exercise needs.;Develop an individualized exercise prescription for aerobic and resistive training based on initial evaluation findings, risk stratification, comorbidities and participant's personal goals.       Expected Outcomes Short Term: Attend rehab on a regular basis to increase amount of physical activity.;Long Term: Add in home exercise to make exercise part of routine and to increase amount of physical activity.;Long Term: Exercising regularly at least 3-5 days a week.       Increase Strength and Stamina Yes       Intervention Develop an individualized exercise prescription for aerobic and resistive training based on initial evaluation findings, risk stratification, comorbidities and participant's personal goals.;Provide advice, education, support and counseling about physical activity/exercise needs.       Expected Outcomes Short Term: Increase workloads from initial exercise prescription for resistance, speed, and METs.;Short Term: Perform resistance training exercises routinely during rehab and add in resistance training at home;Long Term: Improve cardiorespiratory fitness, muscular endurance and strength as measured by increased METs and functional capacity ( )       Able to understand and use rate of perceived exertion (RPE) scale Yes       Intervention Provide education and  explanation on how to use RPE scale       Expected Outcomes Short Term: Able to use RPE daily in rehab to express subjective intensity level;Long Term:  Able to use RPE to guide intensity level when exercising independently       Knowledge and understanding of Target Heart Rate Range (THRR) Yes       Intervention Provide education and explanation of THRR including how the numbers were predicted and where they are located for reference       Expected Outcomes Long Term: Able to use THRR to govern intensity when exercising independently;Short Term: Able to state/look up THRR;Short Term: Able to use daily as guideline for intensity in rehab       Understanding of Exercise Prescription Yes       Intervention Provide education, explanation, and written materials on patient's individual exercise prescription       Expected Outcomes Short Term: Able to explain program exercise prescription;Long Term: Able to explain home exercise prescription to exercise independently       Improve claudication pain toleration; Improve walking ability Yes       Intervention Participate in PAD/SET Rehab 2-3 days a week, walking at home as part of exercise prescription;Attend education sessions to aid in risk factor modification and understanding of disease process       Expected Outcomes Short Term: Improve walking distance/time to onset of claudication pain;Long Term: Improve walking ability and toleration to claudication;Long Term: Improve score of PAD questionnaires          Nutrition:  Target Goals: Understanding of nutrition guidelines, daily intake of sodium 1500mg , cholesterol 200mg , calories 30% from fat and 7% or less from saturated fats, daily to have 5 or more servings of fruits and vegetables.  Biometrics:  Pre Biometrics - 12/27/23 1406       Pre Biometrics   Waist Circumference 38 inches    Hip Circumference 40 inches  Waist to Hip Ratio 0.95 %    Triceps Skinfold 6 mm    % Body Fat 22 %    Grip  Strength 14 kg    Flexibility 10 in    Single Leg Stand 2 seconds            ITP Comments:  ITP Comments     Row Name 12/27/23 1406           ITP Comments Dr Darron Supervising Physician          Comments: Pt would continue to benefit from PAD program to improve claudication when walking uphill.   Physician Documentation: Your signature/cosign is required to indicate approval of the Care Plan as stated above. By signing this report, you are approving the Plan of Care. Please sign and either send electronically or print and fax the signed copy to the number below. Please indicate any changes or limitations in the space provided.   ____________________________________________________________________________________________________________________________________________  Physician Signature: _____________________________________________________ Print Physician Name: ____________________________________________________ Date: ___________________________ Time: _________________________________  [facility]  [place] [place] for Heart, Vascular and Pulmonary Health [address] [place], [place] [address] Phone: [phone] Fax: [phone]

## 2024-01-22 NOTE — Progress Notes (Signed)
 Daily Session Note  Patient Details  Name: Jeremy Johnston MRN: [id number] Date of Birth: 07/12/1950 Referring Provider:   [name] Supervised Exercise Therapy from 12/27/2023 in [facility] for [facility], Vascular, & Lung Health  Referring Provider Dr. Deatrice Cage    Encounter Date: 01/21/2024  Check In:  Session Check In - 01/21/24 1529       Check-In   Supervising physician immediately available to respond to emergencies [facility] MD immediately available    Physician(s) Rosabel Mose, NP    Location [facility]    Staff Present Alm Parkins, MS, ACSM-CEP, CCRP, Exercise Physiologist;Olinty Valere, MS, ACSM-CEP, Exercise Physiologist;Joseph Lennon, RN, Jayson Quan, RN, BSN;Sebastian Dzik, MS, Exercise Physiologist    Virtual Visit No    Medication changes reported     No    Fall or balance concerns reported    No    Tobacco Cessation No Change    Warm-up and Cool-down Performed as group-led instruction    Resistance Training Performed No    VAD Patient? No    PAD/SET Patient? Yes      PAD/SET Patient   Completed foot check today? Yes    Open wounds to report? Yes      Pain Assessment   Currently in Pain? No/denies    Multiple Pain Sites No          Capillary Blood Glucose: No results found for this or any previous visit (from the past 24 hours).    Social History   Tobacco Use  Smoking Status Never  Smokeless Tobacco Never    Goals Met:  Pt participated in TM walking at reduced incline d/t tiredness.   Goals Unmet:  Pt did not participate in strength training d/t fatigue after exercising. All VSS.   Comments: Pt was cleared for supervised exercise therapy by Dr. Deatrice Cage  Due to patients hx of CV disease pt is being watched closely and monitored for any unusual signs/symptoms while exercising. Will continue to watch closely, monitor, and report any unusual or unexpected pain through the program.  Pt  exercised on the Treadmill  for 30 minutes and experienced 3/5 claudication pain that resolved with sitting rest breaks. Pt tolerated exercise w/o unusual and unexpected symptoms. Will continue to monitor and progress through the program following PAD protocol.      Service time is from 1430 to 1530.    Dr. Wilbert Bihari is Medical Director for Cardiac Rehab at [facility].

## 2024-01-23 ENCOUNTER — Encounter (HOSPITAL_COMMUNITY)
Admission: RE | Admit: 2024-01-23 | Discharge: 2024-01-23 | Disposition: A | Source: Ambulatory Visit | Attending: Cardiovascular Disease

## 2024-01-25 ENCOUNTER — Encounter (HOSPITAL_COMMUNITY)
Admission: RE | Admit: 2024-01-25 | Discharge: 2024-01-25 | Disposition: A | Discharge status: Home / Self Care | Source: Ambulatory Visit | Attending: Cardiovascular Disease | Admitting: Cardiovascular Disease

## 2024-01-25 DIAGNOSIS — I70213 Atherosclerosis of native arteries of extremities with intermittent claudication, bilateral legs: Secondary | ICD-10-CM

## 2024-01-25 NOTE — Progress Notes (Signed)
 Daily Session Note  Patient Details  Name: Jeremy Johnston MRN: [id number] Date of Birth: 1950/10/22 Referring Provider:   [name] Supervised Exercise Therapy from 12/27/2023 in [facility] for [facility], Vascular, & Lung Health  Referring Provider Dr. Deatrice Cage    Encounter Date: 01/25/2024  Check In:  Session Check In - 01/25/24 1541       Check-In   Supervising physician immediately available to respond to emergencies [facility] MD immediately available    Physician(s) Damien Braver, NP    Location [facility]    Staff Present Alm Parkins, MS, ACSM-CEP, CCRP, Exercise Physiologist;Olinty Valere, MS, ACSM-CEP, Exercise Physiologist;Maria Whitaker, RN, Marcine Pereyra, MS, Exercise Physiologist;Jetta Walker BS, ACSM-CEP, Exercise Physiologist    Virtual Visit No    Medication changes reported     No    Fall or balance concerns reported    No    Tobacco Cessation No Change    Warm-up and Cool-down Performed as group-led instruction    Resistance Training Performed No    VAD Patient? No    PAD/SET Patient? Yes      PAD/SET Patient   Completed foot check today? Yes    Open wounds to report? No   no new wounds     Pain Assessment   Currently in Pain? No/denies    Multiple Pain Sites No          Capillary Blood Glucose: No results found for this or any previous visit (from the past 24 hours).    Social History   Tobacco Use  Smoking Status Never  Smokeless Tobacco Never    Goals Met:  Independence with exercise equipment Exercise tolerated well  Goals Unmet:  Not Applicable  Comments: Pt was cleared for supervised exercise therapy by Dr. Deatrice Cage  Due to patients hx of CV disease pt is being watched closely and monitored for any unusual signs/symptoms while exercising. Will continue to watch closely, monitor, and report any unusual or unexpected pain through the program.   Pt exercised on the Treadmill  for 20  minutes and experienced 3/5 claudication pain that resolved with sitting rest breaks. Pt tolerated exercise w/o unusual and unexpected symptoms. Will continue to monitor and progress through the program following PAD protocol.   Initial onset of claudication at: 1:00 Longest exercised time before break: 25 Total exercise time/rest time: 25/0   Service time is from 1450 to 1545.      Dr. Wilbert Bihari is Medical Director for Cardiac Rehab at [facility].

## 2024-01-28 ENCOUNTER — Encounter (HOSPITAL_COMMUNITY)
Admission: RE | Admit: 2024-01-28 | Discharge: 2024-01-28 | Disposition: A | Source: Ambulatory Visit | Attending: Cardiovascular Disease

## 2024-01-28 DIAGNOSIS — I70213 Atherosclerosis of native arteries of extremities with intermittent claudication, bilateral legs: Secondary | ICD-10-CM | POA: Diagnosis not present

## 2024-01-30 ENCOUNTER — Encounter (HOSPITAL_COMMUNITY)
Admission: RE | Admit: 2024-01-30 | Discharge: 2024-01-30 | Disposition: A | Source: Ambulatory Visit | Attending: Cardiovascular Disease

## 2024-01-30 DIAGNOSIS — I70213 Atherosclerosis of native arteries of extremities with intermittent claudication, bilateral legs: Secondary | ICD-10-CM

## 2024-01-30 NOTE — Progress Notes (Signed)
 Pt began experiencing lightheadness while exercising. BGL 228 pre-exercise, dropped to 130 and his CGM was downtrending. We placed him on telemetry and observed new onset trigeminy.  Onsite provider notified and provider stated, Genna to resume. Continue SET as planned. Alert us  for presyncope, chest tightness or worsening dyspnea.  Will continue to monitor pt.  Pt's final BGL 124 and uptrending, pt sent home and educated to inform us  if he has any further lightheadedness, presyncope, chest tightness or worsening dyspnea

## 2024-02-01 ENCOUNTER — Encounter (HOSPITAL_COMMUNITY)
Admission: RE | Admit: 2024-02-01 | Discharge: 2024-02-01 | Disposition: A | Discharge status: Home / Self Care | Source: Ambulatory Visit | Attending: Cardiovascular Disease | Admitting: Cardiovascular Disease

## 2024-02-01 DIAGNOSIS — I70213 Atherosclerosis of native arteries of extremities with intermittent claudication, bilateral legs: Secondary | ICD-10-CM

## 2024-02-01 NOTE — Progress Notes (Signed)
 Daily Session Note  Patient Details  Name: Jeremy Johnston MRN: [id number] Date of Birth: March 07, 1951 Referring Provider:   [name] Supervised Exercise Therapy from 12/27/2023 in [facility] for [facility], Vascular, & Lung [facility]  Referring Provider Dr. Deatrice Cage    Encounter Date: 02/01/2024  Check In:  Session Check In - 02/01/24 1629       Check-In   Supervising physician immediately available to respond to emergencies [facility] immediately available    Physician(s) Rosaline Bane, NP    Location [facility]    Staff Present Con Pereyra, MS, Exercise Physiologist;Joseph Lennon, RN, BSN    Virtual Visit No    Medication changes reported     No    Fall or balance concerns reported    No    Tobacco Cessation No Change    Warm-up and Cool-down Performed as group-led instruction    Resistance Training Performed No    VAD Patient? No    PAD/SET Patient? No      PAD/SET Patient   Completed foot check today? Yes    Open wounds to report? No      Pain Assessment   Currently in Pain? No/denies    Multiple Pain Sites No          Capillary Blood Glucose: No results found for this or any previous visit (from the past 24 hours).    Social History   Tobacco Use  Smoking Status Never  Smokeless Tobacco Never    Goals Met:  Independence with exercise equipment Exercise tolerated well No report of concerns or symptoms today  Goals Unmet:  Not Applicable  Comments: Pt was cleared for supervised exercise therapy by Dr. Deatrice Cage  Due to patients hx of CV disease pt is being watched closely and monitored for any unusual signs/symptoms while exercising. Will continue to watch closely, monitor, and report any unusual or unexpected pain through the program.  Pt exercised on the Treadmill  for 20 minutes and experienced 3/5 claudication pain that resolved with sitting rest breaks. Pt tolerated exercise w/o unusual and  unexpected symptoms. Will continue to monitor and progress through the program following PAD protocol.    Initial onset of claudication at: 10:00 Longest exercised time before break: 20:00 Total exercise time/rest time: 30:00  Service time is from 1435 to 1535.    Dr. Wilbert Bihari is Medical Director for Cardiac Rehab at [facility].

## 2024-02-04 ENCOUNTER — Encounter (HOSPITAL_COMMUNITY): Admission: RE | Admit: 2024-02-04 | Source: Ambulatory Visit

## 2024-02-05 ENCOUNTER — Telehealth (HOSPITAL_COMMUNITY): Payer: Self-pay

## 2024-02-05 NOTE — Telephone Encounter (Signed)
 Patient left message calling out for SET class at 1:01pm on 11/03, no reason given.

## 2024-02-06 ENCOUNTER — Encounter (HOSPITAL_COMMUNITY)
Admission: RE | Admit: 2024-02-06 | Discharge: 2024-02-06 | Disposition: A | Discharge status: Home / Self Care | Source: Ambulatory Visit | Attending: Cardiovascular Disease | Admitting: Cardiovascular Disease

## 2024-02-06 DIAGNOSIS — I70213 Atherosclerosis of native arteries of extremities with intermittent claudication, bilateral legs: Secondary | ICD-10-CM | POA: Diagnosis present

## 2024-02-07 NOTE — Progress Notes (Signed)
 Daily Session Note  Patient Details  Name: Jeremy Johnston MRN: [id number] Date of Birth: Aug 22, 1950 Referring Provider:   [name] Supervised Exercise Therapy from 12/27/2023 in [facility] for [facility], Vascular, & [facility]  Referring Provider Dr. Deatrice Cage    Encounter Date: 02/06/2024  Check In:  Session Check In - 02/06/24 1550       Check-In   Supervising physician immediately available to respond to emergencies [facility] immediately available    Physician(s) Orren Fabry, PA-C    [facility]    Staff Present Alm Parkins, MS, ACSM-CEP, CCRP, Exercise Physiologist    Virtual Visit No    Medication changes reported     No    Fall or balance concerns reported    No    Tobacco Cessation No Change    Warm-up and Cool-down Performed as group-led instruction    Resistance Training Performed No    VAD Patient? No    PAD/SET Patient? Yes      PAD/SET Patient   Completed foot check today? Yes    Open wounds to report? Yes      Pain Assessment   Currently in Pain? No/denies    Multiple Pain Sites No          Capillary Blood Glucose: No results found for this or any previous visit (from the past 24 hours).   Exercise Prescription Changes - 02/06/24 1500       Response to Exercise   Blood Pressure (Admit) 112/60    Blood Pressure (Exercise) 158/68    Blood Pressure (Exit) 114/68    Heart Rate (Admit) 78 bpm    Heart Rate (Exercise) 110 bpm    Heart Rate (Exit) 97 bpm    Rating of Perceived Exertion (Exercise) 13    Symptoms 3/5 BL claudication    Comments INC incline to 9%    Duration Progress to 30 minutes of  aerobic without signs/symptoms of physical distress    Intensity THRR unchanged      Progression   Progression Continue to progress workloads to maintain intensity without signs/symptoms of physical distress.    Average METs 5      Resistance Training   Training Prescription No      Treadmill    MPH 2    Grade 9    Minutes 35   20 min/ break then 15 min   METs 5          Social History   Tobacco Use  Smoking Status Never  Smokeless Tobacco Never    Goals Met:  Independence with exercise equipment Exercise tolerated well No report of concerns or symptoms today  Goals Unmet:  Not Applicable  Comments: Pt was cleared for supervised exercise therapy by Dr. Deatrice Cage  Due to patients hx of CV disease pt is being watched closely and monitored for any unusual signs/symptoms while exercising. Will continue to watch closely, monitor, and report any unusual or unexpected pain through the program.  Pt exercised on the Treadmill  for 35 minutes and experienced 3/5 claudication pain that resolved with sitting rest breaks. Pt tolerated exercise w/o unusual and unexpected symptoms. Will continue to monitor and progress through the program following PAD protocol.    Service time is from 1430 to 1530.    Dr. Wilbert Bihari is Medical Director for Cardiac Rehab at [facility].

## 2024-02-08 ENCOUNTER — Encounter (HOSPITAL_COMMUNITY)
Admission: RE | Admit: 2024-02-08 | Discharge: 2024-02-08 | Disposition: A | Source: Ambulatory Visit | Attending: Cardiovascular Disease

## 2024-02-08 DIAGNOSIS — I70213 Atherosclerosis of native arteries of extremities with intermittent claudication, bilateral legs: Secondary | ICD-10-CM | POA: Diagnosis not present

## 2024-02-11 ENCOUNTER — Encounter (HOSPITAL_COMMUNITY)
Admission: RE | Admit: 2024-02-11 | Discharge: 2024-02-11 | Disposition: A | Discharge status: Home / Self Care | Source: Ambulatory Visit | Attending: Cardiovascular Disease | Admitting: Cardiovascular Disease

## 2024-02-11 DIAGNOSIS — I70213 Atherosclerosis of native arteries of extremities with intermittent claudication, bilateral legs: Secondary | ICD-10-CM | POA: Diagnosis not present

## 2024-02-11 NOTE — Progress Notes (Signed)
 Daily Session Note  Patient Details  Name: Jeremy Johnston MRN: [id number] Date of Birth: 1950/08/01 Referring Provider:   [name] Supervised Exercise Therapy from 12/27/2023 in [facility] for [facility], Vascular, & Lung [facility]  Referring Provider Dr. Deatrice Cage    Encounter Date: 02/11/2024  Check In:  Session Check In - 02/11/24 1437       Check-In   Supervising physician immediately available to respond to emergencies [facility] immediately available    Physician(s) Barnie Hila, NP    Location [facility]    Staff Present Augustin Sharps, Grayland Gal, MS, ACSM-CEP, Exercise Physiologist;Jetta Vannie BS, ACSM-CEP, Exercise Physiologist;Maria Whitaker, RN, Valere Music, RN, BSN    Virtual Visit No    Medication changes reported     No    Fall or balance concerns reported    No    Tobacco Cessation No Change    Warm-up and Cool-down Performed as group-led instruction    Resistance Training Performed Yes    VAD Patient? No    PAD/SET Patient? Yes      PAD/SET Patient   Completed foot check today? Yes    Open wounds to report? No      Pain Assessment   Currently in Pain? No/denies    Multiple Pain Sites No          Capillary Blood Glucose: No results found for this or any previous visit (from the past 24 hours).    Social History   Tobacco Use  Smoking Status Never  Smokeless Tobacco Never    Goals Met:  Independence with exercise equipment Exercise tolerated well No report of concerns or symptoms today  Goals Unmet:  Not Applicable  Comments: Pt was cleared for supervised exercise therapy by Dr. Deatrice Cage  Due to patients hx of CV disease pt is being watched closely and monitored for any unusual signs/symptoms while exercising. Will continue to watch closely, monitor, and report any unusual or unexpected pain through the program.   Pt exercised on the Treadmill  for 30 minutes and experienced  2/5 claudication pain. Pt tolerated exercise w/o unusual and unexpected symptoms. Will continue to monitor and progress through the program following PAD protocol.    Service time is from 1434 to 1535.   Dr. Wilbert Bihari is Medical Director for Cardiac Rehab at [facility].

## 2024-02-13 ENCOUNTER — Encounter (HOSPITAL_COMMUNITY)
Admission: RE | Admit: 2024-02-13 | Discharge: 2024-02-13 | Disposition: A | Discharge status: Home / Self Care | Source: Ambulatory Visit | Attending: Cardiovascular Disease | Admitting: Cardiovascular Disease

## 2024-02-13 DIAGNOSIS — I70213 Atherosclerosis of native arteries of extremities with intermittent claudication, bilateral legs: Secondary | ICD-10-CM

## 2024-02-13 NOTE — Progress Notes (Signed)
 Daily Session Note  Patient Details  Name: Jeremy Johnston MRN: [id number] Date of Birth: 09-21-1950 Referring Provider:   [name] Supervised Exercise Therapy from 12/27/2023 in [facility] for Heart, Vascular, & Lung Health  Referring Provider Dr. Deatrice Cage    Encounter Date: 02/13/2024  Check In:  Session Check In - 02/13/24 1532       Check-In   Supervising physician immediately available to respond to emergencies [facility] MD immediately available    Physician(s) Barnie Hila, NP    Location [facility]    Staff Present Ronal Levin, RN, Avonne Gal, MS, ACSM-CEP, Exercise Physiologist;Carlette Bernett, RN, Jayson Quan, RN, Valere Music, RN, BSN    Virtual Visit No    Medication changes reported     No    Fall or balance concerns reported    No    Tobacco Cessation No Change    Warm-up and Cool-down Performed on first and last piece of equipment    Resistance Training Performed Yes    VAD Patient? No    PAD/SET Patient? Yes      PAD/SET Patient   Completed foot check today? No      Pain Assessment   Currently in Pain? No/denies    Multiple Pain Sites No          Capillary Blood Glucose: No results found for this or any previous visit (from the past 24 hours).    Social History   Tobacco Use  Smoking Status Never  Smokeless Tobacco Never    Goals Met:  Proper associated with RPD/PD & O2 Sat Independence with exercise equipment Exercise tolerated well No report of concerns or symptoms today  Goals Unmet:  Not Applicable  Comments:  Pt was cleared for supervised exercise therapy by Dr. Deatrice Cage  Due to patients hx of CV disease pt is being watched closely and monitored for any unusual signs/symptoms while exercising. Will continue to watch closely, monitor, and report any unusual or unexpected pain through the program.   Pt exercised on the Treadmill for 25 minutes and  experienced 2/5 claudication pain. Pt tolerated exercise w/o unusual and unexpected symptoms. Will continue to monitor and progress through the program following PAD protocol.    Service time is from 1439 to 1531.     Dr. Wilbert Bihari is Medical Director for Cardiac Rehab at [facility].

## 2024-02-15 ENCOUNTER — Encounter (HOSPITAL_COMMUNITY)

## 2024-02-18 ENCOUNTER — Encounter (HOSPITAL_COMMUNITY)
Admission: RE | Admit: 2024-02-18 | Discharge: 2024-02-18 | Disposition: A | Source: Ambulatory Visit | Attending: Cardiovascular Disease

## 2024-02-18 DIAGNOSIS — I70213 Atherosclerosis of native arteries of extremities with intermittent claudication, bilateral legs: Secondary | ICD-10-CM

## 2024-02-20 ENCOUNTER — Encounter (HOSPITAL_COMMUNITY)
Admission: RE | Admit: 2024-02-20 | Discharge: 2024-02-20 | Disposition: A | Source: Ambulatory Visit | Attending: Cardiovascular Disease

## 2024-02-20 DIAGNOSIS — I70213 Atherosclerosis of native arteries of extremities with intermittent claudication, bilateral legs: Secondary | ICD-10-CM

## 2024-02-20 NOTE — Progress Notes (Signed)
 PAD/SET Plan of Care Updates  Patient Details  Name: Jeremy Johnston MRN: [id number] Date of Birth: 04/13/1950  Entry Date:  Flowsheet Row Supervised Exercise Therapy from 12/27/2023 in [facility] for Heart, Vascular, & Lung Health  Date 12/27/23   Expected Discharge Date:  Flowsheet Row Supervised Exercise Therapy from 12/27/2023 in [facility] for Heart, Vascular, & Lung Health  Expected Discharge Date 03/17/24    Medical Diagnosis: Atherosclerosis of native arteries of extremities with intermittent claudication, bilateral legs   Encounter Date: 02/20/24  Exercise Target Goals: Exercise Program Goal: Individual exercise prescription set with THRR, safety & activity barriers. Participant demonstrates ability to understand and report RPE using RPE 6-20 scale, to self-measure pulse accurately, and to acknowledge the importance of the exercise prescription.  Exercise Prescription Goal: Use initial exercise assessment to set exercise prescription to improve time and distance to onset of claudication pain. Provide education to aid in steps toward risk factor modification, improve quality of life with claudication, and utilize THRR and exercise prescription for best results for decreasing symptoms of PAD. Prevent injury to bone, joints, and skin integrity during the exercise through checks of each system during session check in. Following physician guidelines for any contraindications to specific exercises.  Activity Barriers:   Initial Exercise Prescription:  Initial Exercise Prescription - 12/27/23 1400       Date of Initial Exercise RX and Referring Provider   Date 12/27/23    Referring Provider Dr. Deatrice Cage    Expected Discharge Date 03/17/24      Treadmill   MPH 2    Grade 3    Minutes 30    METs 3.4      Prescription Details   Frequency (times per week) 3days/week    Duration Progress to 30 minutes of continuous aerobic  without signs/symptoms of physical distress      Intensity   THRR 40-80% of Max Heartrate 59-118    Ratings of Perceived Exertion 11-13    Perceived Dyspnea 0-4      Progression   Progression Continue to follow PAD protocol      Resistance Training   Training Prescription Yes    Weight 4lbs    Reps 10-15          Perform Capillary Blood Glucose checks as needed.  Current Exercise Prescription:   Exercise Prescription Changes     Row Name 12/28/23 1430 12/31/23 0700 12/31/23 1430 01/01/24 0700 01/02/24 1430     Response to Exercise   Blood Pressure (Admit) 126/58 -- 142/56 -- 142/56   Blood Pressure (Exercise) 142/80 -- 146/58 -- 146/58   Blood Pressure (Exit) 98/60 -- 118/68 -- 118/68   Heart Rate (Admit) 71 bpm -- 69 bpm -- 79 bpm   Heart Rate (Exercise) 99 bpm -- 82 bpm -- 87 bpm   Heart Rate (Exit) 77 bpm -- 69 bpm -- 82 bpm   Oxygen Saturation (Admit) 97 % -- 97 % -- --   Oxygen Saturation (Exercise) 97 % -- 98 % -- --   Oxygen Saturation (Exit) 98 % -- 99 % -- --   Rating of Perceived Exertion (Exercise) 12 -- 13 -- 11   Symptoms BL claudication -- BL claudication -- 2/5 BL claudication   Comments 2.5 Claudication -- 3/5 claudication -- Pt increased to 5% incline   Duration Continue with 30 min of aerobic exercise without signs/symptoms of physical distress. -- Continue with 30 min of aerobic exercise  without signs/symptoms of physical distress. -- Continue with 30 min of aerobic exercise without signs/symptoms of physical distress.   Intensity THRR unchanged -- THRR unchanged -- THRR unchanged     Progression   Progression Continue to follow PAD protocol -- Continue to follow PAD protocol -- Continue to follow PAD protocol   Average METs 3.4 -- 3.6 -- 3.9     Resistance Training   Training Prescription Yes -- Yes -- --   Weight 4lbs -- 4 lbs -- --   Reps 10-15 -- 10-15 -- --     Treadmill   MPH 2 -- 2 -- 2   Grade 3 -- 4 -- 5   Minutes 30 -- 55 -- 40    METs 3.4 -- 3.6 -- 3.9    Row Name 01/09/24 1430 01/14/24 1430 01/16/24 1430 02/06/24 1500       Response to Exercise   Blood Pressure (Admit) 118/54 132/58 140/56 112/60    Blood Pressure (Exercise) 136/58 138/56 146/60 158/68    Blood Pressure (Exit) 92/56 120/66 128/88 114/68    Heart Rate (Admit) 89 bpm 73 bpm 80 bpm 78 bpm    Heart Rate (Exercise) 108 bpm 111 bpm 111 bpm 110 bpm    Heart Rate (Exit) 95 bpm 99 bpm 99 bpm 97 bpm    Rating of Perceived Exertion (Exercise) 12 -- -- 13    Symptoms 3/5 BL claudication 3/5 BL claudication 3/5 BL claudication 3/5 BL claudication    Comments Pt increased to 7% incline Pain has shifted from higher calf to lower calf INC incline to 9% INC incline to 9%    Duration Continue with 30 min of aerobic exercise without signs/symptoms of physical distress. Continue with 30 min of aerobic exercise without signs/symptoms of physical distress. Continue with 30 min of aerobic exercise without signs/symptoms of physical distress. Progress to 30 minutes of  aerobic without signs/symptoms of physical distress    Intensity THRR unchanged THRR unchanged THRR unchanged THRR unchanged      Progression   Progression Continue to follow PAD protocol Continue to follow PAD protocol Continue to follow PAD protocol Continue to progress workloads to maintain intensity without signs/symptoms of physical distress.    Average METs 4.5 4.7 5 5       Resistance Training   Training Prescription -- Yes -- No    Weight -- 5 lbs -- --    Reps -- 10-15 -- --    Time -- 5 Minutes -- --      Treadmill   MPH 2 2 2 2     Grade 7 8 9 9     Minutes 30 40 30 35  20 min/ break then 15 min    METs 4.5 4.7 5 5        Exercise Comments:   Exercise Comments     Row Name 12/27/23 1433 12/28/23 0724 12/31/23 0715 01/22/24 0727     Exercise Comments Pt completed SET orienatation. Reviewed informed consent, PAD questionaire, WIQ, diabetic guidelines, and attendance policy. Pt completed  GXT w/o unusual or unexpected symptoms. Claudication resolved after seated rest break. Pt first day of exercise, pt c/p of BL claudication in both calves that resolved with sitting rest break. Post-exercise CBG was 84, pt was given juice and re-checked at 108. Incline increased to 4%, pt tolerated change well. Will increase to 5% on Wednesday. Pt given juice for low blood sugar, recheeck: 114 Pt has completed 4 weeks of PAD program. During those  4 weeks pt incline was increased quickly d/t multiple walking bouts of 20 mins+. Pt has kept his BS in good standing prior to exercise and is given snacks occasionally when his BS drops. Pt is repeating ExRx on home TM.       Program and Patient Goals Exercise Goals and Review:   Exercise Goals     Row Name 12/27/23 1432             Exercise Goals   Increase Physical Activity Yes       Intervention Provide advice, education, support and counseling about physical activity/exercise needs.;Develop an individualized exercise prescription for aerobic and resistive training based on initial evaluation findings, risk stratification, comorbidities and participant's personal goals.       Expected Outcomes Short Term: Attend rehab on a regular basis to increase amount of physical activity.;Long Term: Add in home exercise to make exercise part of routine and to increase amount of physical activity.;Long Term: Exercising regularly at least 3-5 days a week.       Increase Strength and Stamina Yes       Intervention Develop an individualized exercise prescription for aerobic and resistive training based on initial evaluation findings, risk stratification, comorbidities and participant's personal goals.;Provide advice, education, support and counseling about physical activity/exercise needs.       Expected Outcomes Short Term: Increase workloads from initial exercise prescription for resistance, speed, and METs.;Short Term: Perform resistance training exercises  routinely during rehab and add in resistance training at home;Long Term: Improve cardiorespiratory fitness, muscular endurance and strength as measured by increased METs and functional capacity ( )       Able to understand and use rate of perceived exertion (RPE) scale Yes       Intervention Provide education and explanation on how to use RPE scale       Expected Outcomes Short Term: Able to use RPE daily in rehab to express subjective intensity level;Long Term:  Able to use RPE to guide intensity level when exercising independently       Knowledge and understanding of Target Heart Rate Range (THRR) Yes       Intervention Provide education and explanation of THRR including how the numbers were predicted and where they are located for reference       Expected Outcomes Long Term: Able to use THRR to govern intensity when exercising independently;Short Term: Able to state/look up THRR;Short Term: Able to use daily as guideline for intensity in rehab       Understanding of Exercise Prescription Yes       Intervention Provide education, explanation, and written materials on patient's individual exercise prescription       Expected Outcomes Short Term: Able to explain program exercise prescription;Long Term: Able to explain home exercise prescription to exercise independently       Improve claudication pain toleration; Improve walking ability Yes       Intervention Participate in PAD/SET Rehab 2-3 days a week, walking at home as part of exercise prescription;Attend education sessions to aid in risk factor modification and understanding of disease process       Expected Outcomes Short Term: Improve walking distance/time to onset of claudication pain;Long Term: Improve walking ability and toleration to claudication;Long Term: Improve score of PAD questionnaires          Nutrition:  Target Goals: Understanding of nutrition guidelines, daily intake of sodium 1500mg , cholesterol 200mg , calories 30% from  fat and 7% or less from saturated fats, daily  to have 5 or more servings of fruits and vegetables.  Biometrics:  Pre Biometrics - 12/27/23 1406       Pre Biometrics   Waist Circumference 38 inches    Hip Circumference 40 inches    Waist to Hip Ratio 0.95 %    Triceps Skinfold 6 mm    % Body Fat 22 %    Grip Strength 14 kg    Flexibility 10 in    Single Leg Stand 2 seconds           Nutrition Therapy Plan and Nutrition Goals:   Core Components/Risk Factors/Patient Goals at Admission:   ITP Comments:  ITP Comments     Row Name 12/27/23 1406           ITP Comments Dr Darron Supervising Physician          Comments: Pt has increased his incline to 9.5%, pt is accumulating at least 30 minutes of walking each session and has some days where he is walking continuously without stopping. Pt has reported that he has noticed improvement when walking uphill at home and when walking on the TM. Pt will continue to benefit from SET sessions until he graduates on 03/17/24.    Physician Documentation: Your signature/cosign is required to indicate approval of the Care Plan as stated above. By signing this report, you are approving the Plan of Care. Please sign and either send electronically or print and fax the signed copy to the number below. Please indicate any changes or limitations in the space provided.   ____________________________________________________________________________________________________________________________________________  Physician Signature: _____________________________________________________ Print Physician Name: ____________________________________________________ Date: ___________________________ Time: _________________________________  [facility]  [place]. [facility] for Heart, Vascular and Pulmonary Health [address] [place], [place] [address] Phone: [phone] Fax: [phone]

## 2024-02-20 NOTE — Progress Notes (Signed)
 Daily Session Note  Patient Details  Name: Jeremy Johnston MRN: [id number] Date of Birth: 04/10/1950 Referring Provider:   [name] Supervised Exercise Therapy from 12/27/2023 in [facility] for Heart, Vascular, & Lung Health  Referring Provider Dr. Deatrice Cage    Encounter Date: 02/18/2024  Check In: Check In:   Session Check In - 02/18/24 1532                Check-In    Supervising physician immediately available to respond to emergencies [facility] MD immediately available     Physician(s) Barnie Hila, NP     Location [facility]     Staff Present Ronal Levin, RN, Avonne Gal, MS, ACSM-CEP, Exercise Physiologist;Carlette Bernett, RN, Jayson Quan, RN, Valere Music, RN, BSN     Virtual Visit No     Medication changes reported     No     Fall or balance concerns reported    No     Tobacco Cessation No Change     Warm-up and Cool-down Performed on first and last piece of equipment     Resistance Training Performed Yes     VAD Patient? No     PAD/SET Patient? Yes          PAD/SET Patient    Completed foot check today? No          Pain Assessment    Currently in Pain? No/denies     Multiple Pain Sites No              Capillary Blood Glucose: No results found for this or any previous visit (from the past 24 hours).    Social History   Tobacco Use  Smoking Status Never  Smokeless Tobacco Never    Goals Met:  Proper associated with RPD/PD & O2 Sat Independence with exercise equipment Exercise tolerated well No report of concerns or symptoms today Strength training completed today  Goals Unmet:  Not Applicable  Comments: Pt was cleared for supervised exercise therapy by Dr. Deatrice Cage  Due to patients hx of CV disease pt is being watched closely and monitored for any unusual signs/symptoms while exercising. Will continue to watch closely, monitor, and report any unusual or unexpected pain  through the program.   Pt exercised on the Treadmill for 25 minutes and experienced 3/5 claudication pain. Pt tolerated exercise w/o unusual and unexpected symptoms. Will continue to monitor and progress through the program following PAD protocol.    Service time is from 1431 to 1536.    Dr. Wilbert Bihari is Medical Director for Cardiac Rehab at [facility].

## 2024-02-21 NOTE — Progress Notes (Signed)
 Daily Session Note  Patient Details  Name: Jeremy Johnston MRN: [id number] Date of Birth: [birth year] Referring Provider:   [name] Supervised Exercise Therapy from 12/27/2023 in [facility] for Heart, Vascular, & Lung Health  Referring Provider Dr. Deatrice Cage    Encounter Date: 02/20/2024  Check In:  Session Check In - 02/20/24 1442       Check-In   Supervising physician immediately available to respond to emergencies [facility] MD immediately available    Physician(s) Orren Fabry, [facility]    Location [facility]    Staff Present Arnoldo Gal, MS, ACSM-CEP, Exercise Physiologist;Maria Whitaker, RN, Mallory Parkins, MS, ACSM-CEP, CCRP, Exercise Physiologist;Jetta Vannie BS, ACSM-CEP, Exercise Physiologist;Suhaila Troiano [place], MS, Exercise Physiologist;Bailey Elnor, MS, Exercise Physiologist    Virtual Visit No    Medication changes reported     No    Fall or balance concerns reported    No    Tobacco Cessation No Change    Warm-up and Cool-down Performed on first and last piece of equipment    Resistance Training Performed No    VAD Patient? No    PAD/SET Patient? Yes      PAD/SET Patient   Completed foot check today? Yes    Open wounds to report? No      Pain Assessment   Currently in Pain? No/denies    Multiple Pain Sites No          Capillary Blood Glucose: No results found for this or any previous visit (from the past 24 hours).    Social History   Tobacco Use  Smoking Status Never  Smokeless Tobacco Never    Goals Met:  Independence with exercise equipment Exercise tolerated well No report of concerns or symptoms today Strength training completed today  Goals Unmet:  Not Applicable  Comments: Pt was cleared for supervised exercise therapy by Dr. Deatrice Cage  Due to patients hx of CV disease pt is being watched closely and monitored for any unusual signs/symptoms while exercising. Will continue to watch closely,  monitor, and report any unusual or unexpected pain through the program.  Pt exercised on the Treadmill  for 30 minutes and experienced 3/5 claudication pain that resolved with sitting rest breaks. Pt tolerated exercise w/o unusual and unexpected symptoms. Will continue to monitor and progress through the program following PAD protocol.    Service time is from 1430 to 1545.    Dr. Wilbert Bihari is Medical Director for Cardiac Rehab at [facility].

## 2024-02-22 ENCOUNTER — Ambulatory Visit: Admitting: Podiatry

## 2024-02-22 ENCOUNTER — Encounter (HOSPITAL_COMMUNITY)
Admission: RE | Admit: 2024-02-22 | Discharge: 2024-02-22 | Disposition: A | Discharge status: Home / Self Care | Source: Ambulatory Visit | Attending: Cardiovascular Disease | Admitting: Cardiovascular Disease

## 2024-02-22 DIAGNOSIS — I70213 Atherosclerosis of native arteries of extremities with intermittent claudication, bilateral legs: Secondary | ICD-10-CM | POA: Diagnosis not present

## 2024-02-22 NOTE — Progress Notes (Signed)
 Daily Session Note  Patient Details  Name: Jonel Weldon MRN: [id number] Date of Birth: 1951-04-03 Referring Provider:   [name] Supervised Exercise Therapy from 12/27/2023 in [facility] for Heart, Vascular, & Lung Health  Referring Provider Dr. Deatrice Cage    Encounter Date: 02/22/2024  Check In:  Session Check In - 02/22/24 1435       Check-In   Supervising physician immediately available to respond to emergencies [facility] MD immediately available    Physician(s) Orren Fabry, [facility]    Location [facility]    Staff Present Hadassah Quan, RN, Mallory Parkins, MS, ACSM-CEP, CCRP, Exercise Physiologist;Jetta Vannie BS, ACSM-CEP, Exercise Physiologist;Safiatou Islam [place], MS, Exercise Physiologist;Bailey Elnor, MS, Exercise Physiologist;Joseph Lennon, RN, BSN    Virtual Visit No    Medication changes reported     No    Fall or balance concerns reported    No    Tobacco Cessation No Change    Warm-up and Cool-down Performed on first and last piece of equipment    Resistance Training Performed Yes    VAD Patient? No    PAD/SET Patient? Yes      PAD/SET Patient   Completed foot check today? Yes    Open wounds to report? No      Pain Assessment   Currently in Pain? No/denies    Multiple Pain Sites No          Capillary Blood Glucose: No results found for this or any previous visit (from the past 24 hours).    Social History   Tobacco Use  Smoking Status Never  Smokeless Tobacco Never    Goals Met:  Pt exercised for 30 minuytes at 9.5% incline  Goals Unmet:  Not Applicable  Comments: Pt was cleared for supervised exercise therapy by Dr. Deatrice Cage  Due to patients hx of CV disease pt is being watched closely and monitored for any unusual signs/symptoms while exercising. Will continue to watch closely, monitor, and report any unusual or unexpected pain through the program.  Pt exercised on the Treadmill  for 30 minutes  and experienced 3/5 claudication pain that resolved with sitting rest breaks. Pt tolerated exercise w/o unusual and unexpected symptoms. Will continue to monitor and progress through the program following PAD protocol.    Service time is from 1430 to 1530.    Dr. Wilbert Bihari is Medical Director for Cardiac Rehab at [facility].

## 2024-02-25 ENCOUNTER — Encounter (HOSPITAL_COMMUNITY)
Admission: RE | Admit: 2024-02-25 | Discharge: 2024-02-25 | Disposition: A | Discharge status: Home / Self Care | Source: Ambulatory Visit | Attending: Cardiovascular Disease | Admitting: Cardiovascular Disease

## 2024-02-25 ENCOUNTER — Telehealth: Payer: Self-pay | Admitting: Cardiology

## 2024-02-25 ENCOUNTER — Telehealth (HOSPITAL_COMMUNITY): Payer: Self-pay

## 2024-02-25 DIAGNOSIS — I70213 Atherosclerosis of native arteries of extremities with intermittent claudication, bilateral legs: Secondary | ICD-10-CM

## 2024-02-25 NOTE — Telephone Encounter (Signed)
 Spoke to [employer] at [employer].She was calling to report patient had bigeminy after exercise today.Rate 91.Patient stated he has occasionally.He was not having any symptoms.I will make Dr.Crenshaw aware.

## 2024-02-25 NOTE — Progress Notes (Signed)
 Daily Session Note  Patient Details  Name: Jeremy Johnston MRN: [id number] Date of Birth: 04-Apr-1950 Referring Provider:   [name] Supervised Exercise Therapy from 12/27/2023 in [facility] for Heart, Vascular, & Lung Health  Referring Provider Dr. Deatrice Cage    Encounter Date: 02/25/2024  Check In:  Session Check In - 02/25/24 1459       Check-In   Supervising physician immediately available to respond to emergencies [facility] MD immediately available    Physician(s) Carolyn Mose, NP    Location [facility]    Staff Present Ronal Levin, RN, BSN    Virtual Visit No    Medication changes reported     No    Fall or balance concerns reported    No    Tobacco Cessation No Change    Warm-up and Cool-down Performed on first and last piece of equipment    Resistance Training Performed Yes    VAD Patient? No    PAD/SET Patient? Yes      PAD/SET Patient   Completed foot check today? No    Open wounds to report? No      Pain Assessment   Currently in Pain? No/denies    Multiple Pain Sites No          Capillary Blood Glucose: No results found for this or any previous visit (from the past 24 hours).    Social History   Tobacco Use  Smoking Status Never  Smokeless Tobacco Never    Goals Met:  Proper associated with RPD/PD & O2 Sat Independence with exercise equipment Exercise tolerated well No report of concerns or symptoms today Pt on treadmill for at 10% incline, speed of 2  Goals Unmet:  Pt in bigeminy post exercise. Pt asymptomatic, VSS. Called placed to his cardiologist  Comments: Service time is from 1430 to 1530    Dr. Wilbert Bihari is Medical Director for Cardiac Rehab at [facility].

## 2024-02-25 NOTE — Telephone Encounter (Signed)
 Caller [place]) stated patient was in bigeminy today after exercise.  Caller wants advice on next steps and noted patient had no signs or symptoms and vital signs were stable.

## 2024-02-25 NOTE — Telephone Encounter (Addendum)
 Called placed to Dr. Vertie office about pt having bigeminy today after exercise. Pt asymptomatic. Stated he has had this in the past. VSS  1600 CV triage RN called back, will pass message to Dr. Pietro

## 2024-02-26 NOTE — Telephone Encounter (Signed)
 Left message for mary of dr vertie recommendations.

## 2024-02-27 ENCOUNTER — Encounter (HOSPITAL_COMMUNITY)
Admission: RE | Admit: 2024-02-27 | Discharge: 2024-02-27 | Disposition: A | Discharge status: Home / Self Care | Source: Ambulatory Visit | Attending: Cardiovascular Disease | Admitting: Cardiovascular Disease

## 2024-02-27 VITALS — Wt 156.5 lb

## 2024-02-27 DIAGNOSIS — I70213 Atherosclerosis of native arteries of extremities with intermittent claudication, bilateral legs: Secondary | ICD-10-CM

## 2024-02-27 NOTE — Progress Notes (Signed)
 Daily Session Note  Patient Details  Name: Jeremy Johnston MRN: [id number] Date of Birth: 12-07-1950 Referring Provider:   [name] Supervised Exercise Therapy from 12/27/2023 in [facility], [facility]  Referring Provider Dr. Deatrice Cage    Encounter Date: 02/27/2024  Check In:  Session Check In - 02/27/24 1505       Check-In   Supervising physician immediately available to respond to emergencies [facility] MD immediately available    Physician(s) Carolyn Mose, NP    Location [facility]    Staff Present Ronal Levin, RN, Avonne Gal, MS, ACSM-CEP, Exercise Physiologist;Maria Whitaker, RN, Mallory Parkins, MS, ACSM-CEP, CCRP, Exercise Physiologist;Jetta Walker BS, ACSM-CEP, Exercise Physiologist    Virtual Visit No    Medication changes reported     No    Fall or balance concerns reported    No    Tobacco Cessation No Change    Warm-up and Cool-down Performed on first and last piece of equipment    Resistance Training Performed Yes    VAD Patient? No    PAD/SET Patient? No      PAD/SET Patient   Completed foot check today? No    Open wounds to report? No      Pain Assessment   Currently in Pain? No/denies    Multiple Pain Sites No          Capillary Blood Glucose: No results found for this or any previous visit (from the past 24 hours).  POCT Glucose - 02/27/24 1551       POCT Blood Glucose   Pre-Exercise 196 mg/dL    Post-Exercise 845 mg/dL          Exercise Prescription Changes - 02/27/24 1500       Response to Exercise   Blood Pressure (Admit) 122/64    Blood Pressure (Exercise) 142/70    Blood Pressure (Exit) 102/56    Heart Rate (Admit) 78 bpm    Heart Rate (Exercise) 100 bpm    Heart Rate (Exit) 86 bpm    Oxygen Saturation (Admit) 97 %    Oxygen Saturation (Exercise) 98 %    Oxygen Saturation (Exit) 96 %    Rating of Perceived Exertion (Exercise) 12    Symptoms 3/5 BL  claudication    Comments Incline at 10%    Duration Progress to 30 minutes of  aerobic without signs/symptoms of physical distress    Intensity THRR unchanged      Progression   Progression Continue to progress workloads to maintain intensity without signs/symptoms of physical distress.    Average METs 5.2      Resistance Training   Training Prescription No      Treadmill   MPH 2    Grade 10    Minutes 20    METs 5.2          Social History   Tobacco Use  Smoking Status Never  Smokeless Tobacco Never    Goals Met:  Proper associated with RPD/PD & O2 Sat Independence with exercise equipment Exercise tolerated well No report of concerns or symptoms today  Goals Unmet:  none  Comments: Pt was cleared for supervised exercise therapy by Dr. Deatrice Cage. Due to patient's hx of CV disease pt is being watched closely and monitored for any unusual signs/symptoms while exercising. Will continue to watch closely, monitor, and report any unusual or unexpected pain throughout the program.   Pt exercised on the  treadmill for 20 minutes and experienced 3/5 claudication pain that resolved with sitting rest breaks. Pt tolerated exercise w/o unusual and unexpected symptoms. Will continue to monitor and progress through the program following PAD protocol.    Service time is from 1500 to 1545   Dr. Wilbert Bihari is Medical Director for Cardiac Rehab at [facility].

## 2024-02-29 ENCOUNTER — Ambulatory Visit (HOSPITAL_COMMUNITY)

## 2024-03-03 ENCOUNTER — Encounter (HOSPITAL_COMMUNITY)
Admission: RE | Admit: 2024-03-03 | Discharge: 2024-03-03 | Disposition: A | Source: Ambulatory Visit | Attending: Cardiovascular Disease

## 2024-03-03 DIAGNOSIS — I70213 Atherosclerosis of native arteries of extremities with intermittent claudication, bilateral legs: Secondary | ICD-10-CM | POA: Insufficient documentation

## 2024-03-03 NOTE — Progress Notes (Signed)
 Daily Session Note  Patient Details  Name: Jeremy Johnston MRN: [id number] Date of Birth: September 06, 1950 Referring Provider:   [name] Supervised Exercise Therapy from 12/27/2023 in [facility] for [facility], [facility], & Lung [facility]  Referring Provider Dr. Deatrice Cage    Encounter Date: 03/03/2024  Check In:  Session Check In - 03/03/24 1443       Check-In   Supervising physician immediately available to respond to emergencies [facility] immediately available    Physician(s) Damien Braver, NP    [facility]    Staff Present Arnoldo Gal, MS, ACSM-CEP, Exercise Physiologist;Maria Whitaker, RN, Mallory Parkins, MS, ACSM-CEP, CCRP, Exercise Physiologist;Jetta Vannie BS, ACSM-CEP, Exercise Physiologist;Sava Proby Claudene Adell Music, RN, BSN    Virtual Visit No    Medication changes reported     Yes    Comments decreased lisinopril  to 10mg     Fall or balance concerns reported    No    Tobacco Cessation No Change    Warm-up and Cool-down Performed on first and last piece of equipment    Resistance Training Performed Yes    VAD Patient? No    PAD/SET Patient? Yes      PAD/SET Patient   Completed foot check today? No    Open wounds to report? No      Pain Assessment   Currently in Pain? No/denies    Multiple Pain Sites No          Capillary Blood Glucose: No results found for this or any previous visit (from the past 24 hours).   Exercise Prescription Changes - 03/03/24 1500       Response to Exercise   Blood Pressure (Admit) 120/60    Blood Pressure (Exercise) 170/60    Blood Pressure (Exit) 108/58    Heart Rate (Admit) 80 bpm    Heart Rate (Exercise) 108 bpm    Heart Rate (Exit) 93 bpm    Oxygen Saturation (Admit) 98 %    Oxygen Saturation (Exercise) 97 %    Oxygen Saturation (Exit) 97 %    Rating of Perceived Exertion (Exercise) 13    Symptoms 3/5 BL claudication    Comments Incline at 10%    Duration Continue  with 30 min of aerobic exercise without signs/symptoms of physical distress.    Intensity THRR unchanged      Progression   Progression Continue to follow PAD protocol    Average METs 5.5      Resistance Training   Training Prescription No      Treadmill   MPH 2.2    Grade 10    Minutes 20    METs 5.5          Social History   Tobacco Use  Smoking Status Never  Smokeless Tobacco Never    Goals Met:  Proper associated with RPD/PD & O2 Sat Independence with exercise equipment Exercise tolerated well No report of concerns or symptoms today  Goals Unmet:  Not Applicable  Comments: Pt was cleared for supervised exercise therapy by Dr. Deatrice Cage. Due to patient's hx of CV disease pt is being watched closely and monitored for any unusual signs/symptoms while exercising. Will continue to watch closely, monitor, and report any unusual or unexpected pain throughout the program.   Pt exercised on the treadmill for 20 minutes and experienced 3/5 claudication pain that resolved with sitting rest breaks. Pt tolerated exercise w/o unusual and unexpected symptoms. Will continue to monitor and progress  through the program following PAD protocol.      Service time is from 1437 to 77     Dr. Wilbert Bihari is Medical Director for Cardiac Rehab at [facility].

## 2024-03-04 ENCOUNTER — Encounter (INDEPENDENT_AMBULATORY_CARE_PROVIDER_SITE_OTHER): Admitting: Ophthalmology

## 2024-03-04 DIAGNOSIS — H43811 Vitreous degeneration, right eye: Secondary | ICD-10-CM

## 2024-03-04 DIAGNOSIS — E113513 Type 2 diabetes mellitus with proliferative diabetic retinopathy with macular edema, bilateral: Secondary | ICD-10-CM

## 2024-03-04 DIAGNOSIS — Z794 Long term (current) use of insulin: Secondary | ICD-10-CM

## 2024-03-04 DIAGNOSIS — H35033 Hypertensive retinopathy, bilateral: Secondary | ICD-10-CM

## 2024-03-04 DIAGNOSIS — I10 Essential (primary) hypertension: Secondary | ICD-10-CM

## 2024-03-04 DIAGNOSIS — H35373 Puckering of macula, bilateral: Secondary | ICD-10-CM

## 2024-03-05 ENCOUNTER — Encounter (HOSPITAL_COMMUNITY)
Admission: RE | Admit: 2024-03-05 | Discharge: 2024-03-05 | Disposition: A | Source: Ambulatory Visit | Attending: Cardiovascular Disease

## 2024-03-05 DIAGNOSIS — I70213 Atherosclerosis of native arteries of extremities with intermittent claudication, bilateral legs: Secondary | ICD-10-CM | POA: Diagnosis not present

## 2024-03-05 NOTE — Progress Notes (Signed)
 Daily Session Note  Patient Details  Name: Jeremy Johnston MRN: [id number] Date of Birth: 21-Oct-1950 Referring Provider:   [name] Supervised Exercise Therapy from 12/27/2023 in [facility] for [facility], Vascular, & Lung [facility]  Referring Provider Dr. Deatrice Cage    Encounter Date: 03/05/2024  Check In:   Capillary Blood Glucose: No results found for this or any previous visit (from the past 24 hours).   Exercise Prescription Changes - 03/05/24 1500       Response to Exercise   Blood Pressure (Admit) 128/56    Blood Pressure (Exercise) 158/60    Blood Pressure (Exit) 126/54    Heart Rate (Admit) 77 bpm    Heart Rate (Exercise) 108 bpm    Heart Rate (Exit) 52 bpm    Oxygen Saturation (Admit) 97 %    Oxygen Saturation (Exercise) 97 %    Oxygen Saturation (Exit) 96 %    Rating of Perceived Exertion (Exercise) 12    Symptoms 3/5 BL claudication    Comments Incline at 10%    Duration Continue with 30 min of aerobic exercise without signs/symptoms of physical distress.    Intensity THRR unchanged      Progression   Progression Continue to follow PAD protocol    Average METs 5.5      Resistance Training   Training Prescription No      Treadmill   MPH 2.2    Grade 10    Minutes 20    METs 5.5          Social History   Tobacco Use  Smoking Status Never  Smokeless Tobacco Never    Goals Met:  Proper associated with RPD/PD & O2 Sat Independence with exercise equipment Exercise tolerated well No report of concerns or symptoms today Strength training completed today  Goals Unmet:  Not Applicable  Comments: Pt was cleared for supervised exercise therapy by Dr. Deatrice Cage. Due to patient's hx of CV disease pt is being watched closely and monitored for any unusual signs/symptoms while exercising. Will continue to watch closely, monitor, and report any unusual or unexpected pain throughout the program.   Pt exercised on the  treadmill for 20 minutes and experienced 3/5 claudication pain that resolved with sitting rest breaks. Pt tolerated exercise w/o unusual and unexpected symptoms. Will continue to monitor and progress through the program following PAD protocol.      Service time is from 1432 to 1526   Dr. Wilbert Bihari is Medical Director for Cardiac Rehab at [facility].

## 2024-03-07 ENCOUNTER — Encounter (HOSPITAL_COMMUNITY)
Admission: RE | Admit: 2024-03-07 | Discharge: 2024-03-07 | Disposition: A | Source: Ambulatory Visit | Attending: Cardiovascular Disease

## 2024-03-07 DIAGNOSIS — I70213 Atherosclerosis of native arteries of extremities with intermittent claudication, bilateral legs: Secondary | ICD-10-CM | POA: Diagnosis not present

## 2024-03-10 ENCOUNTER — Telehealth (HOSPITAL_COMMUNITY): Payer: Self-pay

## 2024-03-10 ENCOUNTER — Ambulatory Visit (HOSPITAL_COMMUNITY)

## 2024-03-10 NOTE — Telephone Encounter (Signed)
 Patient c/o for SET class due to weather.

## 2024-03-12 ENCOUNTER — Encounter (HOSPITAL_COMMUNITY)
Admission: RE | Admit: 2024-03-12 | Discharge: 2024-03-12 | Disposition: A | Discharge status: Home / Self Care | Source: Ambulatory Visit | Attending: Cardiovascular Disease | Admitting: Cardiovascular Disease

## 2024-03-12 DIAGNOSIS — I70213 Atherosclerosis of native arteries of extremities with intermittent claudication, bilateral legs: Secondary | ICD-10-CM

## 2024-03-12 NOTE — Progress Notes (Signed)
 Daily Session Note  Patient Details  Name: Jeremy Johnston MRN: [id number] Date of Birth: [birth year] Referring Provider:   [name] Supervised Exercise Therapy from 12/27/2023 in [facility] for [facility], Vascular, & Lung [facility]  Referring Provider Dr. Deatrice Cage    Encounter Date: 03/12/2024  Check In:  Session Check In - 03/12/24 1437       Check-In   Supervising physician immediately available to respond to emergencies [facility] immediately available    Physician(s) Lum Louis, NP    Location [facility]    Staff Present Augustin Sharps, RT    Virtual Visit No    Medication changes reported     No    Fall or balance concerns reported    No    Tobacco Cessation No Change    Warm-up and Cool-down Performed on first and last piece of equipment    Resistance Training Performed No    VAD Patient? No    PAD/SET Patient? No      PAD/SET Patient   Completed foot check today? No    Open wounds to report? No      Pain Assessment   Currently in Pain? No/denies    Multiple Pain Sites No          Capillary Blood Glucose: No results found for this or any previous visit (from the past 24 hours).   Exercise Prescription Changes - 03/12/24 1500       Response to Exercise   Blood Pressure (Admit) 138/60    Blood Pressure (Exercise) 144/60    Blood Pressure (Exit) 126/58    Heart Rate (Admit) 79 bpm    Heart Rate (Exercise) 106 bpm    Heart Rate (Exit) 47 bpm    Oxygen Saturation (Admit) 97 %    Oxygen Saturation (Exercise) 97 %    Oxygen Saturation (Exit) 96 %    Rating of Perceived Exertion (Exercise) 12    Symptoms 3/5 BL claudication    Comments Incline at 10%    Duration Continue with 30 min of aerobic exercise without signs/symptoms of physical distress.    Intensity THRR unchanged      Progression   Progression Continue to follow PAD protocol    Average METs 5.5      Resistance Training   Training Prescription No       Treadmill   MPH 2.2    Grade 10    Minutes 20    METs 5.5          Social History   Tobacco Use  Smoking Status Never  Smokeless Tobacco Never    Goals Met:  Proper associated with RPD/PD & O2 Sat Independence with exercise equipment Exercise tolerated well No report of concerns or symptoms today  Goals Unmet:  Not Applicable  Comments: Pt was cleared for supervised exercise therapy by Dr. Deatrice Cage. Due to patient's hx of CV disease pt is being watched closely and monitored for any unusual signs/symptoms while exercising. Will continue to watch closely, monitor, and report any unusual or unexpected pain throughout the program.   Pt exercised on the treadmill for 20 minutes and experienced 3/5 claudication pain that resolved with sitting rest breaks. Pt tolerated exercise w/o unusual and unexpected symptoms. Will continue to monitor and progress through the program following PAD protocol.      Dr. Wilbert Bihari is Medical Director for Cardiac Rehab at [facility].

## 2024-03-14 ENCOUNTER — Encounter (HOSPITAL_COMMUNITY)
Admission: RE | Admit: 2024-03-14 | Discharge: 2024-03-14 | Disposition: A | Source: Ambulatory Visit | Attending: Cardiovascular Disease

## 2024-03-14 DIAGNOSIS — I70213 Atherosclerosis of native arteries of extremities with intermittent claudication, bilateral legs: Secondary | ICD-10-CM

## 2024-03-14 NOTE — Progress Notes (Signed)
 Daily Session Note  Patient Details  Name: Jeremy Johnston MRN: [id number] Date of Birth: [birth year] Referring Provider:   [name] Supervised Exercise Therapy from 12/27/2023 in [facility] for [facility], Vascular, & Lung [facility]  Referring Provider Dr. Deatrice Cage    Encounter Date: 03/14/2024  Check In:  Session Check In - 03/14/24 1432       Check-In   Supervising physician immediately available to respond to emergencies [facility] MD immediately available    Physician(s) Lum Louis, NP    Location [facility]    Staff Present Augustin Sharps, RT    Virtual Visit No    Medication changes reported     No    Fall or balance concerns reported    No    Tobacco Cessation No Change    Warm-up and Cool-down Performed on first and last piece of equipment    Resistance Training Performed No    VAD Patient? No    PAD/SET Patient? Yes      PAD/SET Patient   Completed foot check today? Yes    Open wounds to report? No      Pain Assessment   Currently in Pain? No/denies    Multiple Pain Sites No          Capillary Blood Glucose: No results found for this or any previous visit (from the past 24 hours).   Exercise Prescription Changes - 03/14/24 1500       Response to Exercise   Blood Pressure (Admit) 142/60    Blood Pressure (Exercise) 150/70    Blood Pressure (Exit) 132/60    Heart Rate (Admit) 76 bpm    Heart Rate (Exercise) 103 bpm    Heart Rate (Exit) 93 bpm    Oxygen Saturation (Admit) 99 %    Oxygen Saturation (Exercise) 96 %    Oxygen Saturation (Exit) 94 %    Rating of Perceived Exertion (Exercise) 12    Symptoms 3/5 BL claudication    Comments Incline at 10%    Duration Continue with 30 min of aerobic exercise without signs/symptoms of physical distress.    Intensity THRR unchanged      Progression   Progression Continue to follow PAD protocol    Average METs 5.5      Resistance Training   Training Prescription  No      Treadmill   MPH 2.2    Grade 10    Minutes 20    METs 5.5          Tobacco Use History[1]  Goals Met:  Proper associated with RPD/PD & O2 Sat Independence with exercise equipment Exercise tolerated well No report of concerns or symptoms today  Goals Unmet:  Not Applicable  Comments:  Pt was cleared for supervised exercise therapy by Dr. Deatrice Cage. Due to patient's hx of CV disease pt is being watched closely and monitored for any unusual signs/symptoms while exercising. Will continue to watch closely, monitor, and report any unusual or unexpected pain throughout the program.   Pt exercised on the treadmill for 20 minutes and experienced 3/5 claudication pain that resolved with sitting rest breaks. Pt tolerated exercise w/o unusual and unexpected symptoms. Will continue to monitor and progress through the program following PAD protocol.    Dr. Wilbert Bihari is Medical Director for Cardiac Rehab at [facility].    [1]  Social History Tobacco Use  Smoking Status Never  Smokeless Tobacco Never

## 2024-03-17 ENCOUNTER — Encounter (HOSPITAL_COMMUNITY)

## 2024-04-11 ENCOUNTER — Telehealth: Payer: Self-pay

## 2024-04-11 NOTE — Telephone Encounter (Signed)
 Auth Submission: NO AUTH NEEDED Site of care: Site of care: [facility] Payer: Medicare A/B with [employer] Medication & CPT/J Code(s) submitted: [name]  (Inclisiran) J1306 Diagnosis Code:  Route of submission (phone, fax, portal):  Phone # Fax # Auth type: Buy/Bill PB Units/visits requested: 284mg  x 2 doses Reference number:  Approval from: 04/11/24 to 05/03/25

## 2024-04-30 ENCOUNTER — Encounter (INDEPENDENT_AMBULATORY_CARE_PROVIDER_SITE_OTHER): Admitting: Ophthalmology

## 2024-05-07 ENCOUNTER — Encounter (INDEPENDENT_AMBULATORY_CARE_PROVIDER_SITE_OTHER): Admitting: Ophthalmology

## 2024-05-07 DIAGNOSIS — Z794 Long term (current) use of insulin: Secondary | ICD-10-CM | POA: Diagnosis not present

## 2024-05-07 DIAGNOSIS — E113513 Type 2 diabetes mellitus with proliferative diabetic retinopathy with macular edema, bilateral: Secondary | ICD-10-CM | POA: Diagnosis not present

## 2024-05-07 DIAGNOSIS — I10 Essential (primary) hypertension: Secondary | ICD-10-CM | POA: Diagnosis not present

## 2024-05-07 DIAGNOSIS — H43811 Vitreous degeneration, right eye: Secondary | ICD-10-CM | POA: Diagnosis not present

## 2024-05-07 DIAGNOSIS — H35033 Hypertensive retinopathy, bilateral: Secondary | ICD-10-CM | POA: Diagnosis not present

## 2024-06-04 ENCOUNTER — Encounter (INDEPENDENT_AMBULATORY_CARE_PROVIDER_SITE_OTHER): Admitting: Ophthalmology

## 2024-06-17 ENCOUNTER — Ambulatory Visit: Admitting: Cardiovascular Disease

## 2024-08-18 ENCOUNTER — Ambulatory Visit: Admitting: Cardiology

## 2024-11-26 ENCOUNTER — Ambulatory Visit
